# Patient Record
Sex: Female | Born: 1990 | Race: White | Hispanic: No | Marital: Single | Smoking: Former smoker
Health system: Southern US, Community
[De-identification: ages and names within clinical notes are randomized; demographics above are authoritative.]

## PROBLEM LIST (undated history)

## (undated) ENCOUNTER — Ambulatory Visit

## (undated) DIAGNOSIS — F909 Attention-deficit hyperactivity disorder, unspecified type: Secondary | ICD-10-CM

## (undated) DIAGNOSIS — G43909 Migraine, unspecified, not intractable, without status migrainosus: Secondary | ICD-10-CM

## (undated) DIAGNOSIS — I10 Essential (primary) hypertension: Secondary | ICD-10-CM

## (undated) DIAGNOSIS — M419 Scoliosis, unspecified: Secondary | ICD-10-CM

## (undated) HISTORY — DX: Essential (primary) hypertension: I10

## (undated) HISTORY — DX: Migraine, unspecified, not intractable, without status migrainosus: G43.909

## (undated) HISTORY — PX: NO PAST SURGERIES: SHX2092

---

## 2017-06-24 ENCOUNTER — Encounter (HOSPITAL_COMMUNITY): Payer: Self-pay | Admitting: Emergency Medicine

## 2017-06-24 ENCOUNTER — Ambulatory Visit (HOSPITAL_COMMUNITY)
Admission: EM | Admit: 2017-06-24 | Discharge: 2017-06-24 | Disposition: A | Discharge status: Home / Self Care | Payer: Self-pay | Attending: Physician Assistant | Admitting: Physician Assistant

## 2017-06-24 ENCOUNTER — Other Ambulatory Visit: Payer: Self-pay

## 2017-06-24 DIAGNOSIS — J039 Acute tonsillitis, unspecified: Secondary | ICD-10-CM

## 2017-06-24 DIAGNOSIS — J029 Acute pharyngitis, unspecified: Secondary | ICD-10-CM | POA: Insufficient documentation

## 2017-06-24 LAB — POCT RAPID STREP A: STREPTOCOCCUS, GROUP A SCREEN (DIRECT): NEGATIVE

## 2017-06-24 MED ORDER — LIDOCAINE VISCOUS 2 % MT SOLN: 5.0000 mL | OROMUCOSAL | 0 refills | Status: DC | PRN

## 2017-06-24 MED ORDER — AMOXICILLIN 500 MG PO CAPS: 1000.0000 mg | ORAL_CAPSULE | Freq: Two times a day (BID) | ORAL | 0 refills | Status: DC

## 2017-06-24 MED ORDER — IBUPROFEN 800 MG PO TABS: 800.0000 mg | ORAL_TABLET | Freq: Three times a day (TID) | ORAL | 0 refills | Status: DC

## 2017-06-24 NOTE — ED Provider Notes (Signed)
06/24/2017 7:35 PM   DOB: [birth year] / MRN: [id number]  SUBJECTIVE:  Joan Dawson is a 27 y.o. female presenting for right-sided sore throat times 4 days.  She denies fever.  She denies cough or nasal congestion.  She says the pain is getting worse.  She has never had strep throat in the past.  She has never had these symptoms in the past either.  She associates right-sided ear pain.  She denies a change in hearing.  Her menstrual cycle started today and is on time for her.  She states this is always regular.  She has No Known Allergies.   She  has no past medical history on file.    She  reports that she has been smoking.  She uses smokeless tobacco. She reports that she does not drink alcohol or use drugs. She  has no sexual activity history on file. The patient  has no past surgical history on file.  Her family history includes Diabetes in her mother.  Review of Systems  Constitutional: Negative for chills, diaphoresis and fever.  Respiratory: Negative for cough, hemoptysis, sputum production, shortness of breath and wheezing.   Cardiovascular: Negative for chest pain, orthopnea and leg swelling.  Gastrointestinal: Negative for nausea.  Skin: Negative for rash.  Neurological: Negative for dizziness.    OBJECTIVE:  BP 123/65 (BP Location: Left Arm)   Pulse 81   Temp 98.2 F (36.8 C) (Oral)   Wt 242 lb (109.8 kg)   SpO2 100%   Physical Exam  Constitutional: She is active.  Non-toxic appearance.  HENT:  Mouth/Throat: Oropharyngeal exudate (Right-sided.  Uvula midline.) present.  Cardiovascular: Normal rate and regular rhythm.  Pulmonary/Chest: Effort normal. No tachypnea.  Neurological: She is alert.  Skin: Skin is warm and dry. She is not diaphoretic. No pallor.    No results found for this or any previous visit (from the past 72 hour(s)).  No results found.  ASSESSMENT AND PLAN:  No orders of the defined types were placed in this encounter.    Exudative  tonsillitis: Given the isolated complaint of sore throat and her exam I am going to treat her for strep pharyngitis.  I have given her some pain options as well.  Fortunately the uvula is midline and she is well-appearing.      The patient is advised to call or return to clinic if she does not see an improvement in symptoms, or to seek the care of the closest emergency department if she worsens with the above plan.   Deliah BostonMichael Jaylinn Hellenbrand, MHS, PA-C 06/24/2017 7:35 PM    Ofilia Neaslark, Caisen Mangas L, PA-C 06/24/17 [phone]

## 2017-06-24 NOTE — Discharge Instructions (Signed)
Start all of the medications tonight.  We will inform you of the results of your  culture in a few days if your strep test is negative tonight.

## 2017-06-27 LAB — CULTURE, GROUP A STREP (THRC)

## 2017-07-07 ENCOUNTER — Ambulatory Visit (HOSPITAL_COMMUNITY)
Admission: EM | Admit: 2017-07-07 | Discharge: 2017-07-07 | Disposition: A | Discharge status: Home / Self Care | Payer: Self-pay | Attending: Family Medicine | Admitting: Family Medicine

## 2017-07-07 ENCOUNTER — Other Ambulatory Visit: Payer: Self-pay

## 2017-07-07 ENCOUNTER — Encounter (HOSPITAL_COMMUNITY): Payer: Self-pay | Admitting: Emergency Medicine

## 2017-07-07 DIAGNOSIS — R197 Diarrhea, unspecified: Secondary | ICD-10-CM

## 2017-07-07 DIAGNOSIS — J029 Acute pharyngitis, unspecified: Secondary | ICD-10-CM

## 2017-07-07 HISTORY — DX: Scoliosis, unspecified: M41.9

## 2017-07-07 HISTORY — DX: Attention-deficit hyperactivity disorder, unspecified type: F90.9

## 2017-07-07 MED ORDER — CHLORHEXIDINE GLUCONATE 0.12 % MT SOLN: 15.0000 mL | Freq: Two times a day (BID) | OROMUCOSAL | 0 refills | Status: DC

## 2017-07-07 NOTE — Discharge Instructions (Signed)
The painful white area on the right side of your posterior pharynx is called a "concretion" which is really trapped food that has oxidized white.  Gently tease the white material loose with a Q-tip several times a day.  Imodium AD for the diarrhea.  Avoid milk products and greasy food for 2 days.

## 2017-07-07 NOTE — ED Provider Notes (Signed)
[facility]   [id number] 07/07/17 Arrival Time: 1350   SUBJECTIVE:  Joan Dawson is a 27 y.o. female who presents to the urgent care with complaint of Epigastric pain that started last night., no appetite Patient is nauseated, no vomiting.   Patient has diarrhea that started at 3 this morning  Recently treated with amoxicillin.  Past Medical History:  Diagnosis Date  . ADHD   . Scoliosis    Family History  Problem Relation Age of Onset  . Diabetes Mother   . Crohn's disease Sister    Social History   Socioeconomic History  . Marital status: Single    Spouse name: Not on file  . Number of children: Not on file  . Years of education: Not on file  . Highest education level: Not on file  Social Needs  . Financial resource strain: Not on file  . Food insecurity - worry: Not on file  . Food insecurity - inability: Not on file  . Transportation needs - medical: Not on file  . Transportation needs - non-medical: Not on file  Occupational History  . Not on file  Tobacco Use  . Smoking status: Current Some Day Smoker  . Smokeless tobacco: Current User  Substance and Sexual Activity  . Alcohol use: No    Frequency: Never  . Drug use: No  . Sexual activity: Not on file  Other Topics Concern  . Not on file  Social History Narrative  . Not on file   No outpatient medications have been marked as taking for the 07/07/17 encounter [facility](Hospital Encounter).   No Known Allergies    ROS: As per HPI, remainder of ROS negative.   OBJECTIVE:   Vitals:   07/07/17 1449  BP: 122/71  Pulse: 85  Resp: 18  Temp: 97.9 F (36.6 C)  TempSrc: Oral  SpO2: 100%     General appearance: alert; no distress Eyes: PERRL; EOMI; conjunctiva normal HENT: normocephalic; atraumatic; TMs normal, canal normal, external ears normal without trauma; nasal mucosa normal; oral mucosa: concretion right tonsillar pillar Neck: supple Lungs: clear to auscultation bilaterally Heart:  regular rate and rhythm Abdomen: soft, mildly tender epigastrium with no mass or organomegaly;  No rebound. Back: no CVA tenderness Extremities: no cyanosis or edema; symmetrical with no gross deformities Skin: warm and dry Neurologic: normal gait; grossly normal Psychological: alert and cooperative; normal mood and affect      Labs:  Results for orders placed or performed during the hospital encounter of 06/24/17  Culture, group A strep  Result Value Ref Range   Specimen Description THROAT    Special Requests NONE    Culture      NO GROUP A STREP (S.PYOGENES) ISOLATED Performed at [facility], [address] [place], [place] [address]    Report Status 06/27/2017 FINAL   POCT rapid strep A [facility]  Result Value Ref Range   Streptococcus, Group A Screen (Direct) NEGATIVE NEGATIVE    Labs Reviewed - No data to display  No results found.     ASSESSMENT & PLAN:  1. Diarrhea, unspecified type   2. Acute pharyngitis, unspecified etiology     Meds ordered this encounter  Medications  . chlorhexidine (PERIDEX) 0.12 % solution    Sig: Use as directed 15 mLs in the mouth or throat 2 (two) times daily.    Dispense:  120 mL    Refill:  0    Reviewed expectations re: course of current  medical issues. Questions answered. Outlined signs and symptoms indicating need for more acute intervention. Patient verbalized understanding. After Visit Summary given.    Procedures:      Elvina Sidle, MD 07/07/17 [phone]

## 2017-07-07 NOTE — ED Triage Notes (Signed)
Epigastric pain that started last night., no appetite Patient is nauseated, no vomiting.   Patient has diarrhea that started at 3 this morning

## 2017-08-14 ENCOUNTER — Other Ambulatory Visit: Payer: Self-pay

## 2017-08-14 ENCOUNTER — Encounter (HOSPITAL_COMMUNITY): Payer: Self-pay | Admitting: Emergency Medicine

## 2017-08-14 ENCOUNTER — Emergency Department (HOSPITAL_COMMUNITY)
Admission: EM | Admit: 2017-08-14 | Discharge: 2017-08-14 | Disposition: A | Discharge status: Home / Self Care | Payer: Self-pay | Attending: Emergency Medicine | Admitting: Emergency Medicine

## 2017-08-14 DIAGNOSIS — Z79899 Other long term (current) drug therapy: Secondary | ICD-10-CM | POA: Insufficient documentation

## 2017-08-14 DIAGNOSIS — R109 Unspecified abdominal pain: Secondary | ICD-10-CM | POA: Insufficient documentation

## 2017-08-14 DIAGNOSIS — R3129 Other microscopic hematuria: Secondary | ICD-10-CM | POA: Insufficient documentation

## 2017-08-14 DIAGNOSIS — F17290 Nicotine dependence, other tobacco product, uncomplicated: Secondary | ICD-10-CM | POA: Insufficient documentation

## 2017-08-14 LAB — CBC WITH DIFFERENTIAL/PLATELET
Basophils Absolute: 0 10*3/uL (ref 0.0–0.1)
Basophils Relative: 0 %
Eosinophils Absolute: 0.1 10*3/uL (ref 0.0–0.7)
Eosinophils Relative: 1 %
HEMATOCRIT: 40.9 % (ref 36.0–46.0)
HEMOGLOBIN: 13.7 g/dL (ref 12.0–15.0)
LYMPHS PCT: 32 %
Lymphs Abs: 2.6 10*3/uL (ref 0.7–4.0)
MCH: 29.9 pg (ref 26.0–34.0)
MCHC: 33.5 g/dL (ref 30.0–36.0)
MCV: 89.3 fL (ref 78.0–100.0)
Monocytes Absolute: 0.4 10*3/uL (ref 0.1–1.0)
Monocytes Relative: 5 %
NEUTROS ABS: 5.2 10*3/uL (ref 1.7–7.7)
NEUTROS PCT: 62 %
Platelets: 291 10*3/uL (ref 150–400)
RBC: 4.58 MIL/uL (ref 3.87–5.11)
RDW: 12.4 % (ref 11.5–15.5)
WBC: 8.4 10*3/uL (ref 4.0–10.5)

## 2017-08-14 LAB — COMPREHENSIVE METABOLIC PANEL
ALK PHOS: 61 U/L (ref 38–126)
ALT: 18 U/L (ref 14–54)
AST: 15 U/L (ref 15–41)
Albumin: 4.1 g/dL (ref 3.5–5.0)
Anion gap: 11 (ref 5–15)
BILIRUBIN TOTAL: 0.8 mg/dL (ref 0.3–1.2)
BUN: 8 mg/dL (ref 6–20)
CALCIUM: 9.2 mg/dL (ref 8.9–10.3)
CO2: 24 mmol/L (ref 22–32)
CREATININE: 0.75 mg/dL (ref 0.44–1.00)
Chloride: 104 mmol/L (ref 101–111)
Glucose, Bld: 94 mg/dL (ref 65–99)
Potassium: 3.9 mmol/L (ref 3.5–5.1)
SODIUM: 139 mmol/L (ref 135–145)
Total Protein: 8 g/dL (ref 6.5–8.1)

## 2017-08-14 LAB — URINALYSIS, ROUTINE W REFLEX MICROSCOPIC
Bilirubin Urine: NEGATIVE
Glucose, UA: NEGATIVE mg/dL
Ketones, ur: NEGATIVE mg/dL
Nitrite: NEGATIVE
Protein, ur: NEGATIVE mg/dL
SPECIFIC GRAVITY, URINE: 1.003 — AB (ref 1.005–1.030)
pH: 8 (ref 5.0–8.0)

## 2017-08-14 LAB — PREGNANCY, URINE: PREG TEST UR: NEGATIVE

## 2017-08-14 LAB — LIPASE, BLOOD: Lipase: 28 U/L (ref 11–51)

## 2017-08-14 MED ORDER — CYCLOBENZAPRINE HCL 10 MG PO TABS: 10.0000 mg | ORAL_TABLET | Freq: Two times a day (BID) | ORAL | 0 refills | Status: DC | PRN

## 2017-08-14 NOTE — ED Triage Notes (Signed)
Rt flank pain on and off x 4 days, also reports some on and off nausea.

## 2017-08-14 NOTE — Discharge Instructions (Signed)
You have a small amount of microscopic blood in your urine.  This will need to be rechecked in 3 to 4 weeks.  Otherwise blood work was normal.  Prescription for muscle relaxer.  Can take ibuprofen or Tylenol for pain.

## 2017-08-14 NOTE — ED Provider Notes (Signed)
[facility] EMERGENCY DEPARTMENT Provider Note   CSN: [id number] Arrival date & time: 08/14/17  1125     History   Chief Complaint Chief Complaint  Patient presents with  . Flank Pain    HPI Joan Dawson is a 27 y.o. female.  Right flank pain for 3 to 4 days.  No dysuria, hematuria, radiation to the anterior abdomen.  Last menstrual period 3/30.  No trauma.  Family history positive for renal carcinoma in the mother.  Patient works in a call center and attends school.  No history of gallbladder disease.  Severity of pain is mild.  Palpation makes pain worse.     Past Medical History:  Diagnosis Date  . ADHD   . Scoliosis     There are no active problems to display for this patient.   History reviewed. No pertinent surgical history.   OB History   None      Home Medications    Prior to Admission medications   Medication Sig Start Date End Date Taking? Authorizing Provider  Cranberry 500 MG TABS Take 1 tablet by mouth daily.   Yes [provider]  cyclobenzaprine (FLEXERIL) 10 MG tablet Take 1 tablet (10 mg total) by mouth 2 (two) times daily as needed for muscle spasms. 08/14/17   Donnetta Hutching, MD    Family History Family History  Problem Relation Age of Onset  . Diabetes Mother   . Crohn's disease Sister     Social History Social History   Tobacco Use  . Smoking status: Current Some Day Smoker    Types: Cigars  . Smokeless tobacco: Current User  Substance Use Topics  . Alcohol use: No    Frequency: Never  . Drug use: No     Allergies   Patient has no known allergies.   Review of Systems Review of Systems  All other systems reviewed and are negative.    Physical Exam Updated Vital Signs BP 123/77   Pulse 82   Temp 98.5 F (36.9 C) (Oral)   Resp 18   Ht 5\' 9"  (1.753 m)   Wt 114.3 kg (252 lb)   LMP 07/21/2017 (Exact Date)   SpO2 99%   BMI 37.21 kg/m   Physical Exam  Constitutional: She is oriented to person, place,  and time. She appears well-developed and well-nourished.  HENT:  Head: Normocephalic and atraumatic.  Eyes: Conjunctivae are normal.  Neck: Neck supple.  Cardiovascular: Normal rate and regular rhythm.  Pulmonary/Chest: Effort normal and breath sounds normal.  Abdominal: Soft. Bowel sounds are normal.  Genitourinary:  Genitourinary Comments: Minimal right flank tenderness.  Musculoskeletal: Normal range of motion.  Neurological: She is alert and oriented to person, place, and time.  Skin: Skin is warm and dry.  Psychiatric: She has a normal mood and affect. Her behavior is normal.  Nursing note and vitals reviewed.    ED Treatments / Results  Labs (all labs ordered are listed, but only abnormal results are displayed) Labs Reviewed  URINALYSIS, ROUTINE W REFLEX MICROSCOPIC - Abnormal; Notable for the following components:      Result Value   Color, Urine STRAW (*)    Specific Gravity, Urine 1.003 (*)    Hgb urine dipstick SMALL (*)    Leukocytes, UA TRACE (*)    Bacteria, UA RARE (*)    All other components within normal limits  CBC WITH DIFFERENTIAL/PLATELET  COMPREHENSIVE METABOLIC PANEL  LIPASE, BLOOD  PREGNANCY, URINE    EKG None  Radiology No results found.  Procedures Procedures (including critical care time)  Medications Ordered in ED Medications - No data to display   Initial Impression / Assessment and Plan / ED Course  I have reviewed the triage vital signs and the nursing notes.  Pertinent labs & imaging results that were available during my care of the patient were reviewed by me and considered in my medical decision making (see chart for details).     Patient presents with right flank pain without urinary symptoms.  UTI, kidney stone, gallbladder disease all discussed.  Urinalysis shows small amount of hemoglobin.  Otherwise blood work was normal.  These findings were discussed with patient and her mother.  She will get a repeat urinalysis in 3 to 4  weeks.  If still positive at that time, patient can pursue a more extensive work-up.  Final Clinical Impressions(s) / ED Diagnoses   Final diagnoses:  Right flank pain  Microscopic hematuria    ED Discharge Orders        Ordered    cyclobenzaprine (FLEXERIL) 10 MG tablet  2 times daily PRN     08/14/17 1513       Donnetta Hutchingook, Damyiah Moxley, MD 08/14/17 1538

## 2018-02-15 DIAGNOSIS — Z3490 Encounter for supervision of normal pregnancy, unspecified, unspecified trimester: Secondary | ICD-10-CM | POA: Insufficient documentation

## 2018-02-18 ENCOUNTER — Encounter: Payer: Self-pay | Admitting: Obstetrics and Gynecology

## 2018-02-18 ENCOUNTER — Ambulatory Visit (INDEPENDENT_AMBULATORY_CARE_PROVIDER_SITE_OTHER): Payer: Medicaid Other | Admitting: Obstetrics and Gynecology

## 2018-02-18 ENCOUNTER — Other Ambulatory Visit (HOSPITAL_COMMUNITY)
Admission: RE | Admit: 2018-02-18 | Discharge: 2018-02-18 | Disposition: A | Discharge status: Home / Self Care | Payer: Medicaid Other | Source: Ambulatory Visit | Attending: Obstetrics and Gynecology | Admitting: Obstetrics and Gynecology

## 2018-02-18 DIAGNOSIS — Z3490 Encounter for supervision of normal pregnancy, unspecified, unspecified trimester: Secondary | ICD-10-CM

## 2018-02-18 DIAGNOSIS — Z3481 Encounter for supervision of other normal pregnancy, first trimester: Secondary | ICD-10-CM | POA: Diagnosis not present

## 2018-02-18 DIAGNOSIS — A749 Chlamydial infection, unspecified: Secondary | ICD-10-CM

## 2018-02-18 DIAGNOSIS — Z3491 Encounter for supervision of normal pregnancy, unspecified, first trimester: Secondary | ICD-10-CM | POA: Insufficient documentation

## 2018-02-18 DIAGNOSIS — O98811 Other maternal infectious and parasitic diseases complicating pregnancy, first trimester: Secondary | ICD-10-CM

## 2018-02-18 DIAGNOSIS — Z8669 Personal history of other diseases of the nervous system and sense organs: Secondary | ICD-10-CM

## 2018-02-18 DIAGNOSIS — O98819 Other maternal infectious and parasitic diseases complicating pregnancy, unspecified trimester: Secondary | ICD-10-CM

## 2018-02-18 NOTE — Patient Instructions (Signed)
 First Trimester of Pregnancy The first trimester of pregnancy is from week 1 until the end of week 13 (months 1 through 3). A week after a sperm fertilizes an egg, the egg will implant on the wall of the uterus. This embryo will begin to develop into a baby. Genes from you and your partner will form the baby. The female genes will determine whether the baby will be a boy or a girl. At 6-8 weeks, the eyes and face will be formed, and the heartbeat can be seen on ultrasound. At the end of 12 weeks, all the baby's organs will be formed. Now that you are pregnant, you will want to do everything you can to have a healthy baby. Two of the most important things are to get good prenatal care and to follow your health care provider's instructions. Prenatal care is all the medical care you receive before the baby's birth. This care will help prevent, find, and treat any problems during the pregnancy and childbirth. Body changes during your first trimester Your body goes through many changes during pregnancy. The changes vary from woman to woman.  You may gain or lose a couple of pounds at first.  You may feel sick to your stomach (nauseous) and you may throw up (vomit). If the vomiting is uncontrollable, call your health care provider.  You may tire easily.  You may develop headaches that can be relieved by medicines. All medicines should be approved by your health care provider.  You may urinate more often. Painful urination may mean you have a bladder infection.  You may develop heartburn as a result of your pregnancy.  You may develop constipation because certain hormones are causing the muscles that push stool through your intestines to slow down.  You may develop hemorrhoids or swollen veins (varicose veins).  Your breasts may begin to grow larger and become tender. Your nipples may stick out more, and the tissue that surrounds them (areola) may become darker.  Your gums may bleed and may be  sensitive to brushing and flossing.  Dark spots or blotches (chloasma, mask of pregnancy) may develop on your face. This will likely fade after the baby is born.  Your menstrual periods will stop.  You may have a loss of appetite.  You may develop cravings for certain kinds of food.  You may have changes in your emotions from day to day, such as being excited to be pregnant or being concerned that something may go wrong with the pregnancy and baby.  You may have more vivid and strange dreams.  You may have changes in your hair. These can include thickening of your hair, rapid growth, and changes in texture. Some women also have hair loss during or after pregnancy, or hair that feels dry or thin. Your hair will most likely return to normal after your baby is born.  What to expect at prenatal visits During a routine prenatal visit:  You will be weighed to make sure you and the baby are growing normally.  Your blood pressure will be taken.  Your abdomen will be measured to track your baby's growth.  The fetal heartbeat will be listened to between weeks 10 and 14 of your pregnancy.  Test results from any previous visits will be discussed.  Your health care provider may ask you:  How you are feeling.  If you are feeling the baby move.  If you have had any abnormal symptoms, such as leaking fluid, bleeding, severe   headaches, or abdominal cramping.  If you are using any tobacco products, including cigarettes, chewing tobacco, and electronic cigarettes.  If you have any questions.  Other tests that may be performed during your first trimester include:  Blood tests to find your blood type and to check for the presence of any previous infections. The tests will also be used to check for low iron levels (anemia) and protein on red blood cells (Rh antibodies). Depending on your risk factors, or if you previously had diabetes during pregnancy, you may have tests to check for high blood  sugar that affects pregnant women (gestational diabetes).  Urine tests to check for infections, diabetes, or protein in the urine.  An ultrasound to confirm the proper growth and development of the baby.  Fetal screens for spinal cord problems (spina bifida) and Down syndrome.  HIV (human immunodeficiency virus) testing. Routine prenatal testing includes screening for HIV, unless you choose not to have this test.  You may need other tests to make sure you and the baby are doing well.  Follow these instructions at home: Medicines  Follow your health care provider's instructions regarding medicine use. Specific medicines may be either safe or unsafe to take during pregnancy.  Take a prenatal vitamin that contains at least 600 micrograms (mcg) of folic acid.  If you develop constipation, try taking a stool softener if your health care provider approves. Eating and drinking  Eat a balanced diet that includes fresh fruits and vegetables, whole grains, good sources of protein such as meat, eggs, or tofu, and low-fat dairy. Your health care provider will help you determine the amount of weight gain that is right for you.  Avoid raw meat and uncooked cheese. These carry germs that can cause birth defects in the baby.  Eating four or five small meals rather than three large meals a day may help relieve nausea and vomiting. If you start to feel nauseous, eating a few soda crackers can be helpful. Drinking liquids between meals, instead of during meals, also seems to help ease nausea and vomiting.  Limit foods that are high in fat and processed sugars, such as fried and sweet foods.  To prevent constipation: ? Eat foods that are high in fiber, such as fresh fruits and vegetables, whole grains, and beans. ? Drink enough fluid to keep your urine clear or pale yellow. Activity  Exercise only as directed by your health care provider. Most women can continue their usual exercise routine during  pregnancy. Try to exercise for 30 minutes at least 5 days a week. Exercising will help you: ? Control your weight. ? Stay in shape. ? Be prepared for labor and delivery.  Experiencing pain or cramping in the lower abdomen or lower back is a good sign that you should stop exercising. Check with your health care provider before continuing with normal exercises.  Try to avoid standing for long periods of time. Move your legs often if you must stand in one place for a long time.  Avoid heavy lifting.  Wear low-heeled shoes and practice good posture.  You may continue to have sex unless your health care provider tells you not to. Relieving pain and discomfort  Wear a good support bra to relieve breast tenderness.  Take warm sitz baths to soothe any pain or discomfort caused by hemorrhoids. Use hemorrhoid cream if your health care provider approves.  Rest with your legs elevated if you have leg cramps or low back pain.  If you   develop varicose veins in your legs, wear support hose. Elevate your feet for 15 minutes, 3-4 times a day. Limit salt in your diet. Prenatal care  Schedule your prenatal visits by the twelfth week of pregnancy. They are usually scheduled monthly at first, then more often in the last 2 months before delivery.  Write down your questions. Take them to your prenatal visits.  Keep all your prenatal visits as told by your health care provider. This is important. Safety  Wear your seat belt at all times when driving.  Make a list of emergency phone numbers, including numbers for family, friends, the hospital, and police and fire departments. General instructions  Ask your health care provider for a referral to a local prenatal education class. Begin classes no later than the beginning of month 6 of your pregnancy.  Ask for help if you have counseling or nutritional needs during pregnancy. Your health care provider can offer advice or refer you to specialists for help  with various needs.  Do not use hot tubs, steam rooms, or saunas.  Do not douche or use tampons or scented sanitary pads.  Do not cross your legs for long periods of time.  Avoid cat litter boxes and soil used by cats. These carry germs that can cause birth defects in the baby and possibly loss of the fetus by miscarriage or stillbirth.  Avoid all smoking, herbs, alcohol, and medicines not prescribed by your health care provider. Chemicals in these products affect the formation and growth of the baby.  Do not use any products that contain nicotine or tobacco, such as cigarettes and e-cigarettes. If you need help quitting, ask your health care provider. You may receive counseling support and other resources to help you quit.  Schedule a dentist appointment. At home, brush your teeth with a soft toothbrush and be gentle when you floss. Contact a health care provider if:  You have dizziness.  You have mild pelvic cramps, pelvic pressure, or nagging pain in the abdominal area.  You have persistent nausea, vomiting, or diarrhea.  You have a bad smelling vaginal discharge.  You have pain when you urinate.  You notice increased swelling in your face, hands, legs, or ankles.  You are exposed to fifth disease or chickenpox.  You are exposed to German measles (rubella) and have never had it. Get help right away if:  You have a fever.  You are leaking fluid from your vagina.  You have spotting or bleeding from your vagina.  You have severe abdominal cramping or pain.  You have rapid weight gain or loss.  You vomit blood or material that looks like coffee grounds.  You develop a severe headache.  You have shortness of breath.  You have any kind of trauma, such as from a fall or a car accident. Summary  The first trimester of pregnancy is from week 1 until the end of week 13 (months 1 through 3).  Your body goes through many changes during pregnancy. The changes vary from  woman to woman.  You will have routine prenatal visits. During those visits, your health care provider will examine you, discuss any test results you may have, and talk with you about how you are feeling. This information is not intended to replace advice given to you by your health care provider. Make sure you discuss any questions you have with your health care provider. Document Released: 04/04/2001 Document Revised: 03/22/2016 Document Reviewed: 03/22/2016 [employer] Interactive Patient Education  2018   [employer]   Second Trimester of Pregnancy The second trimester is from week 14 through week 27 (months 4 through 6). The second trimester is often a time when you feel your best. Your body has adjusted to being pregnant, and you begin to feel better physically. Usually, morning sickness has lessened or quit completely, you may have more energy, and you may have an increase in appetite. The second trimester is also a time when the fetus is growing rapidly. At the end of the sixth month, the fetus is about 9 inches long and weighs about 1 pounds. You will likely begin to feel the baby move (quickening) between 16 and 20 weeks of pregnancy. Body changes during your second trimester Your body continues to go through many changes during your second trimester. The changes vary from woman to woman.  Your weight will continue to increase. You will notice your lower abdomen bulging out.  You may begin to get stretch marks on your hips, abdomen, and breasts.  You may develop headaches that can be relieved by medicines. The medicines should be approved by your health care provider.  You may urinate more often because the fetus is pressing on your bladder.  You may develop or continue to have heartburn as a result of your pregnancy.  You may develop constipation because certain hormones are causing the muscles that push waste through your intestines to slow down.  You may develop hemorrhoids or  swollen, bulging veins (varicose veins).  You may have back pain. This is caused by: ? Weight gain. ? Pregnancy hormones that are relaxing the joints in your pelvis. ? A shift in weight and the muscles that support your balance.  Your breasts will continue to grow and they will continue to become tender.  Your gums may bleed and may be sensitive to brushing and flossing.  Dark spots or blotches (chloasma, mask of pregnancy) may develop on your face. This will likely fade after the baby is born.  A dark line from your belly button to the pubic area (linea nigra) may appear. This will likely fade after the baby is born.  You may have changes in your hair. These can include thickening of your hair, rapid growth, and changes in texture. Some women also have hair loss during or after pregnancy, or hair that feels dry or thin. Your hair will most likely return to normal after your baby is born.  What to expect at prenatal visits During a routine prenatal visit:  You will be weighed to make sure you and the fetus are growing normally.  Your blood pressure will be taken.  Your abdomen will be measured to track your baby's growth.  The fetal heartbeat will be listened to.  Any test results from the previous visit will be discussed.  Your health care provider may ask you:  How you are feeling.  If you are feeling the baby move.  If you have had any abnormal symptoms, such as leaking fluid, bleeding, severe headaches, or abdominal cramping.  If you are using any tobacco products, including cigarettes, chewing tobacco, and electronic cigarettes.  If you have any questions.  Other tests that may be performed during your second trimester include:  Blood tests that check for: ? Low iron levels (anemia). ? High blood sugar that affects pregnant women (gestational diabetes) between 24 and 28 weeks. ? Rh antibodies. This is to check for a protein on red blood cells (Rh factor).  Urine  tests to   check for infections, diabetes, or protein in the urine.  An ultrasound to confirm the proper growth and development of the baby.  An amniocentesis to check for possible genetic problems.  Fetal screens for spina bifida and Down syndrome.  HIV (human immunodeficiency virus) testing. Routine prenatal testing includes screening for HIV, unless you choose not to have this test.  Follow these instructions at home: Medicines  Follow your health care provider's instructions regarding medicine use. Specific medicines may be either safe or unsafe to take during pregnancy.  Take a prenatal vitamin that contains at least 600 micrograms (mcg) of folic acid.  If you develop constipation, try taking a stool softener if your health care provider approves. Eating and drinking  Eat a balanced diet that includes fresh fruits and vegetables, whole grains, good sources of protein such as meat, eggs, or tofu, and low-fat dairy. Your health care provider will help you determine the amount of weight gain that is right for you.  Avoid raw meat and uncooked cheese. These carry germs that can cause birth defects in the baby.  If you have low calcium intake from food, talk to your health care provider about whether you should take a daily calcium supplement.  Limit foods that are high in fat and processed sugars, such as fried and sweet foods.  To prevent constipation: ? Drink enough fluid to keep your urine clear or pale yellow. ? Eat foods that are high in fiber, such as fresh fruits and vegetables, whole grains, and beans. Activity  Exercise only as directed by your health care provider. Most women can continue their usual exercise routine during pregnancy. Try to exercise for 30 minutes at least 5 days a week. Stop exercising if you experience uterine contractions.  Avoid heavy lifting, wear low heel shoes, and practice good posture.  A sexual relationship may be continued unless your health  care provider directs you otherwise. Relieving pain and discomfort  Wear a good support bra to prevent discomfort from breast tenderness.  Take warm sitz baths to soothe any pain or discomfort caused by hemorrhoids. Use hemorrhoid cream if your health care provider approves.  Rest with your legs elevated if you have leg cramps or low back pain.  If you develop varicose veins, wear support hose. Elevate your feet for 15 minutes, 3-4 times a day. Limit salt in your diet. Prenatal Care  Write down your questions. Take them to your prenatal visits.  Keep all your prenatal visits as told by your health care provider. This is important. Safety  Wear your seat belt at all times when driving.  Make a list of emergency phone numbers, including numbers for family, friends, the hospital, and police and fire departments. General instructions  Ask your health care provider for a referral to a local prenatal education class. Begin classes no later than the beginning of month 6 of your pregnancy.  Ask for help if you have counseling or nutritional needs during pregnancy. Your health care provider can offer advice or refer you to specialists for help with various needs.  Do not use hot tubs, steam rooms, or saunas.  Do not douche or use tampons or scented sanitary pads.  Do not cross your legs for long periods of time.  Avoid cat litter boxes and soil used by cats. These carry germs that can cause birth defects in the baby and possibly loss of the fetus by miscarriage or stillbirth.  Avoid all smoking, herbs, alcohol, and unprescribed drugs. Chemicals   in these products can affect the formation and growth of the baby.  Do not use any products that contain nicotine or tobacco, such as cigarettes and e-cigarettes. If you need help quitting, ask your health care provider.  Visit your dentist if you have not gone yet during your pregnancy. Use a soft toothbrush to brush your teeth and be gentle when  you floss. Contact a health care provider if:  You have dizziness.  You have mild pelvic cramps, pelvic pressure, or nagging pain in the abdominal area.  You have persistent nausea, vomiting, or diarrhea.  You have a bad smelling vaginal discharge.  You have pain when you urinate. Get help right away if:  You have a fever.  You are leaking fluid from your vagina.  You have spotting or bleeding from your vagina.  You have severe abdominal cramping or pain.  You have rapid weight gain or weight loss.  You have shortness of breath with chest pain.  You notice sudden or extreme swelling of your face, hands, ankles, feet, or legs.  You have not felt your baby move in over an hour.  You have severe headaches that do not go away when you take medicine.  You have vision changes. Summary  The second trimester is from week 14 through week 27 (months 4 through 6). It is also a time when the fetus is growing rapidly.  Your body goes through many changes during pregnancy. The changes vary from woman to woman.  Avoid all smoking, herbs, alcohol, and unprescribed drugs. These chemicals affect the formation and growth your baby.  Do not use any tobacco products, such as cigarettes, chewing tobacco, and e-cigarettes. If you need help quitting, ask your health care provider.  Contact your health care provider if you have any questions. Keep all prenatal visits as told by your health care provider. This is important. This information is not intended to replace advice given to you by your health care provider. Make sure you discuss any questions you have with your health care provider. Document Released: 04/04/2001 Document Revised: 05/16/2016 Document Reviewed: 05/16/2016 [employer] Interactive Patient Education  2018 [employer]   Contraception Choices Contraception, also called birth control, refers to methods or devices that prevent pregnancy. Hormonal methods Contraceptive  implant A contraceptive implant is a thin, plastic tube that contains a hormone. It is inserted into the upper part of the arm. It can remain in place for up to 3 years. Progestin-only injections Progestin-only injections are injections of progestin, a synthetic form of the hormone progesterone. They are given every 3 months by a health care provider. Birth control pills Birth control pills are pills that contain hormones that prevent pregnancy. They must be taken once a day, preferably at the same time each day. Birth control patch The birth control patch contains hormones that prevent pregnancy. It is placed on the skin and must be changed once a week for three weeks and removed on the fourth week. A prescription is needed to use this method of contraception. Vaginal ring A vaginal ring contains hormones that prevent pregnancy. It is placed in the vagina for three weeks and removed on the fourth week. After that, the process is repeated with a new ring. A prescription is needed to use this method of contraception. Emergency contraceptive Emergency contraceptives prevent pregnancy after unprotected sex. They come in pill form and can be taken up to 5 days after sex. They work best the sooner they are taken after having   sex. Most emergency contraceptives are available without a prescription. This method should not be used as your only form of birth control. Barrier methods Female condom A female condom is a thin sheath that is worn over the penis during sex. Condoms keep sperm from going inside a woman's body. They can be used with a spermicide to increase their effectiveness. They should be disposed after a single use. Female condom A female condom is a soft, loose-fitting sheath that is put into the vagina before sex. The condom keeps sperm from going inside a woman's body. They should be disposed after a single use. Diaphragm A diaphragm is a soft, dome-shaped barrier. It is inserted into the vagina  before sex, along with a spermicide. The diaphragm blocks sperm from entering the uterus, and the spermicide kills sperm. A diaphragm should be left in the vagina for 6-8 hours after sex and removed within 24 hours. A diaphragm is prescribed and fitted by a health care provider. A diaphragm should be replaced every 1-2 years, after giving birth, after gaining more than 15 lb (6.8 kg), and after pelvic surgery. Cervical cap A cervical cap is a round, soft latex or plastic cup that fits over the cervix. It is inserted into the vagina before sex, along with spermicide. It blocks sperm from entering the uterus. The cap should be left in place for 6-8 hours after sex and removed within 48 hours. A cervical cap must be prescribed and fitted by a health care provider. It should be replaced every 2 years. Sponge A sponge is a soft, circular piece of polyurethane foam with spermicide on it. The sponge helps block sperm from entering the uterus, and the spermicide kills sperm. To use it, you make it wet and then insert it into the vagina. It should be inserted before sex, left in for at least 6 hours after sex, and removed and thrown away within 30 hours. Spermicides Spermicides are chemicals that kill or block sperm from entering the cervix and uterus. They can come as a cream, jelly, suppository, foam, or tablet. A spermicide should be inserted into the vagina with an applicator at least 10-15 minutes before sex to allow time for it to work. The process must be repeated every time you have sex. Spermicides do not require a prescription. Intrauterine contraception Intrauterine device (IUD) An IUD is a T-shaped device that is put in a woman's uterus. There are two types:  Hormone IUD.This type contains progestin, a synthetic form of the hormone progesterone. This type can stay in place for 3-5 years.  Copper IUD.This type is wrapped in copper wire. It can stay in place for 10 years.  Permanent methods of  contraception Female tubal ligation In this method, a woman's fallopian tubes are sealed, tied, or blocked during surgery to prevent eggs from traveling to the uterus. Hysteroscopic sterilization In this method, a small, flexible insert is placed into each fallopian tube. The inserts cause scar tissue to form in the fallopian tubes and block them, so sperm cannot reach an egg. The procedure takes about 3 months to be effective. Another form of birth control must be used during those 3 months. Female sterilization This is a procedure to tie off the tubes that carry sperm (vasectomy). After the procedure, the man can still ejaculate fluid (semen). Natural planning methods Natural family planning In this method, a couple does not have sex on days when the woman could become pregnant. Calendar method This means keeping track of the   length of each menstrual cycle, identifying the days when pregnancy can happen, and not having sex on those days. Ovulation method In this method, a couple avoids sex during ovulation. Symptothermal method This method involves not having sex during ovulation. The woman typically checks for ovulation by watching changes in her temperature and in the consistency of cervical mucus. Post-ovulation method In this method, a couple waits to have sex until after ovulation. Summary  Contraception, also called birth control, means methods or devices that prevent pregnancy.  Hormonal methods of contraception include implants, injections, pills, patches, vaginal rings, and emergency contraceptives.  Barrier methods of contraception can include female condoms, female condoms, diaphragms, cervical caps, sponges, and spermicides.  There are two types of IUDs (intrauterine devices). An IUD can be put in a woman's uterus to prevent pregnancy for 3-5 years.  Permanent sterilization can be done through a procedure for males, females, or both.  Natural family planning methods involve  not having sex on days when the woman could become pregnant. This information is not intended to replace advice given to you by your health care provider. Make sure you discuss any questions you have with your health care provider. Document Released: 04/10/2005 Document Revised: 05/13/2016 Document Reviewed: 05/13/2016 Elsevier Interactive Patient Education  2018 [employer] Inc.   Breastfeeding Choosing to breastfeed is one of the best decisions you can make for yourself and your baby. A change in hormones during pregnancy causes your breasts to make breast milk in your milk-producing glands. Hormones prevent breast milk from being released before your baby is born. They also prompt milk flow after birth. Once breastfeeding has begun, thoughts of your baby, as well as his or her sucking or crying, can stimulate the release of milk from your milk-producing glands. Benefits of breastfeeding Research shows that breastfeeding offers many health benefits for infants and mothers. It also offers a cost-free and convenient way to feed your baby. For your baby  Your first milk (colostrum) helps your baby's digestive system to function better.  Special cells in your milk (antibodies) help your baby to fight off infections.  Breastfed babies are less likely to develop asthma, allergies, obesity, or type 2 diabetes. They are also at lower risk for sudden infant death syndrome (SIDS).  Nutrients in breast milk are better able to meet your baby's needs compared to infant formula.  Breast milk improves your baby's brain development. For you  Breastfeeding helps to create a very special bond between you and your baby.  Breastfeeding is convenient. Breast milk costs nothing and is always available at the correct temperature.  Breastfeeding helps to burn calories. It helps you to lose the weight that you gained during pregnancy.  Breastfeeding makes your uterus return faster to its size before pregnancy.  It also slows bleeding (lochia) after you give birth.  Breastfeeding helps to lower your risk of developing type 2 diabetes, osteoporosis, rheumatoid arthritis, cardiovascular disease, and breast, ovarian, uterine, and endometrial cancer later in life. Breastfeeding basics Starting breastfeeding  Find a comfortable place to sit or lie down, with your neck and back well-supported.  Place a pillow or a rolled-up blanket under your baby to bring him or her to the level of your breast (if you are seated). Nursing pillows are specially designed to help support your arms and your baby while you breastfeed.  Make sure that your baby's tummy (abdomen) is facing your abdomen.  Gently massage your breast. With your fingertips, massage from the outer edges of   your breast inward toward the nipple. This encourages milk flow. If your milk flows slowly, you may need to continue this action during the feeding.  Support your breast with 4 fingers underneath and your thumb above your nipple (make the letter "C" with your hand). Make sure your fingers are well away from your nipple and your baby's mouth.  Stroke your baby's lips gently with your finger or nipple.  When your baby's mouth is open wide enough, quickly bring your baby to your breast, placing your entire nipple and as much of the areola as possible into your baby's mouth. The areola is the colored area around your nipple. ? More areola should be visible above your baby's upper lip than below the lower lip. ? Your baby's lips should be opened and extended outward (flanged) to ensure an adequate, comfortable latch. ? Your baby's tongue should be between his or her lower gum and your breast.  Make sure that your baby's mouth is correctly positioned around your nipple (latched). Your baby's lips should create a seal on your breast and be turned out (everted).  It is common for your baby to suck about 2-3 minutes in order to start the flow of breast  milk. Latching Teaching your baby how to latch onto your breast properly is very important. An improper latch can cause nipple pain, decreased milk supply, and poor weight gain in your baby. Also, if your baby is not latched onto your nipple properly, he or she may swallow some air during feeding. This can make your baby fussy. Burping your baby when you switch breasts during the feeding can help to get rid of the air. However, teaching your baby to latch on properly is still the best way to prevent fussiness from swallowing air while breastfeeding. Signs that your baby has successfully latched onto your nipple  Silent tugging or silent sucking, without causing you pain. Infant's lips should be extended outward (flanged).  Swallowing heard between every 3-4 sucks once your milk has started to flow (after your let-down milk reflex occurs).  Muscle movement above and in front of his or her ears while sucking.  Signs that your baby has not successfully latched onto your nipple  Sucking sounds or smacking sounds from your baby while breastfeeding.  Nipple pain.  If you think your baby has not latched on correctly, slip your finger into the corner of your baby's mouth to break the suction and place it between your baby's gums. Attempt to start breastfeeding again. Signs of successful breastfeeding Signs from your baby  Your baby will gradually decrease the number of sucks or will completely stop sucking.  Your baby will fall asleep.  Your baby's body will relax.  Your baby will retain a small amount of milk in his or her mouth.  Your baby will let go of your breast by himself or herself.  Signs from you  Breasts that have increased in firmness, weight, and size 1-3 hours after feeding.  Breasts that are softer immediately after breastfeeding.  Increased milk volume, as well as a change in milk consistency and color by the fifth day of breastfeeding.  Nipples that are not sore,  cracked, or bleeding.  Signs that your baby is getting enough milk  Wetting at least 1-2 diapers during the first 24 hours after birth.  Wetting at least 5-6 diapers every 24 hours for the first week after birth. The urine should be clear or pale yellow by the age of 5   days.  Wetting 6-8 diapers every 24 hours as your baby continues to grow and develop.  At least 3 stools in a 24-hour period by the age of 5 days. The stool should be soft and yellow.  At least 3 stools in a 24-hour period by the age of 7 days. The stool should be seedy and yellow.  No loss of weight greater than 10% of birth weight during the first 3 days of life.  Average weight gain of 4-7 oz (113-198 g) per week after the age of 4 days.  Consistent daily weight gain by the age of 5 days, without weight loss after the age of 2 weeks. After a feeding, your baby may spit up a small amount of milk. This is normal. Breastfeeding frequency and duration Frequent feeding will help you make more milk and can prevent sore nipples and extremely full breasts (breast engorgement). Breastfeed when you feel the need to reduce the fullness of your breasts or when your baby shows signs of hunger. This is called "breastfeeding on demand." Signs that your baby is hungry include:  Increased alertness, activity, or restlessness.  Movement of the head from side to side.  Opening of the mouth when the corner of the mouth or cheek is stroked (rooting).  Increased sucking sounds, smacking lips, cooing, sighing, or squeaking.  Hand-to-mouth movements and sucking on fingers or hands.  Fussing or crying.  Avoid introducing a pacifier to your baby in the first 4-6 weeks after your baby is born. After this time, you may choose to use a pacifier. Research has shown that pacifier use during the first year of a baby's life decreases the risk of sudden infant death syndrome (SIDS). Allow your baby to feed on each breast as long as he or she  wants. When your baby unlatches or falls asleep while feeding from the first breast, offer the second breast. Because newborns are often sleepy in the first few weeks of life, you may need to awaken your baby to get him or her to feed. Breastfeeding times will vary from baby to baby. However, the following rules can serve as a guide to help you make sure that your baby is properly fed:  Newborns (babies 4 weeks of age or younger) may breastfeed every 1-3 hours.  Newborns should not go without breastfeeding for longer than 3 hours during the day or 5 hours during the night.  You should breastfeed your baby a minimum of 8 times in a 24-hour period.  Breast milk pumping Pumping and storing breast milk allows you to make sure that your baby is exclusively fed your breast milk, even at times when you are unable to breastfeed. This is especially important if you go back to work while you are still breastfeeding, or if you are not able to be present during feedings. Your lactation consultant can help you find a method of pumping that works best for you and give you guidelines about how long it is safe to store breast milk. Caring for your breasts while you breastfeed Nipples can become dry, cracked, and sore while breastfeeding. The following recommendations can help keep your breasts moisturized and healthy:  Avoid using soap on your nipples.  Wear a supportive bra designed especially for nursing. Avoid wearing underwire-style bras or extremely tight bras (sports bras).  Air-dry your nipples for 3-4 minutes after each feeding.  Use only cotton bra pads to absorb leaked breast milk. Leaking of breast milk between feedings is normal.    Use lanolin on your nipples after breastfeeding. Lanolin helps to maintain your skin's normal moisture barrier. Pure lanolin is not harmful (not toxic) to your baby. You may also hand express a few drops of breast milk and gently massage that milk into your nipples and  allow the milk to air-dry.  In the first few weeks after giving birth, some women experience breast engorgement. Engorgement can make your breasts feel heavy, warm, and tender to the touch. Engorgement peaks within 3-5 days after you give birth. The following recommendations can help to ease engorgement:  Completely empty your breasts while breastfeeding or pumping. You may want to start by applying warm, moist heat (in the shower or with warm, water-soaked hand towels) just before feeding or pumping. This increases circulation and helps the milk flow. If your baby does not completely empty your breasts while breastfeeding, pump any extra milk after he or she is finished.  Apply ice packs to your breasts immediately after breastfeeding or pumping, unless this is too uncomfortable for you. To do this: ? Put ice in a plastic bag. ? Place a towel between your skin and the bag. ? Leave the ice on for 20 minutes, 2-3 times a day.  Make sure that your baby is latched on and positioned properly while breastfeeding.  If engorgement persists after 48 hours of following these recommendations, contact your health care provider or a lactation consultant. Overall health care recommendations while breastfeeding  Eat 3 healthy meals and 3 snacks every day. Well-nourished mothers who are breastfeeding need an additional 450-500 calories a day. You can meet this requirement by increasing the amount of a balanced diet that you eat.  Drink enough water to keep your urine pale yellow or clear.  Rest often, relax, and continue to take your prenatal vitamins to prevent fatigue, stress, and low vitamin and mineral levels in your body (nutrient deficiencies).  Do not use any products that contain nicotine or tobacco, such as cigarettes and e-cigarettes. Your baby may be harmed by chemicals from cigarettes that pass into breast milk and exposure to secondhand smoke. If you need help quitting, ask your health care  provider.  Avoid alcohol.  Do not use illegal drugs or marijuana.  Talk with your health care provider before taking any medicines. These include over-the-counter and prescription medicines as well as vitamins and herbal supplements. Some medicines that may be harmful to your baby can pass through breast milk.  It is possible to become pregnant while breastfeeding. If birth control is desired, ask your health care provider about options that will be safe while breastfeeding your baby. Where to find more information: [employer]: www.llli.org Contact a health care provider if:  You feel like you want to stop breastfeeding or have become frustrated with breastfeeding.  Your nipples are cracked or bleeding.  Your breasts are red, tender, or warm.  You have: ? Painful breasts or nipples. ? A swollen area on either breast. ? A fever or chills. ? Nausea or vomiting. ? Drainage other than breast milk from your nipples.  Your breasts do not become full before feedings by the fifth day after you give birth.  You feel sad and depressed.  Your baby is: ? Too sleepy to eat well. ? Having trouble sleeping. ? More than 1 week old and wetting fewer than 6 diapers in a 24-hour period. ? Not gaining weight by 5 days of age.  Your baby has fewer than 3 stools in a   24-hour period.  Your baby's skin or the white parts of his or her eyes become yellow. Get help right away if:  Your baby is overly tired (lethargic) and does not want to wake up and feed.  Your baby develops an unexplained fever. Summary  Breastfeeding offers many health benefits for infant and mothers.  Try to breastfeed your infant when he or she shows early signs of hunger.  Gently tickle or stroke your baby's lips with your finger or nipple to allow the baby to open his or her mouth. Bring the baby to your breast. Make sure that much of the areola is in your baby's mouth. Offer one side and burp the  baby before you offer the other side.  Talk with your health care provider or lactation consultant if you have questions or you face problems as you breastfeed. This information is not intended to replace advice given to you by your health care provider. Make sure you discuss any questions you have with your health care provider. Document Released: 04/10/2005 Document Revised: 05/12/2016 Document Reviewed: 05/12/2016 Elsevier Interactive Patient Education  2018 Elsevier Inc.  

## 2018-02-18 NOTE — Progress Notes (Signed)
Pt presents for initial NOB visit today. This is not a planned pregnancy and FOB is not in the picture.  Pt c/o nausea all day every day.  She voices concerns about working around chemicals during pregnancy.  Flu vaccine offered; pt declined.  Babyscripts optimized schedule offered; pt declined.

## 2018-02-18 NOTE — Progress Notes (Signed)
  Subjective:    Joan Dawson is a G1P0 [redacted]w[redacted]d being seen today for her first obstetrical visit.  Her obstetrical history is significant for first pregnancy. Patient does intend to breast feed. Pregnancy history fully reviewed.  Patient reports nausea and declines medical intervention at this time. 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 ADHD   . Migraines   . Scoliosis    History reviewed. No pertinent surgical history. Family History  Problem Relation Age of Onset  . Diabetes Mother   . Uterine cancer Mother   . Hypertension Mother   . Crohn's disease Sister   . Hypertension Sister   . Hypertension Brother      Exam    Uterus:   12-week size  Pelvic Exam:    Perineum: No Hemorrhoids, Normal Perineum   Vulva: normal   Vagina:  normal mucosa, normal discharge   pH:    Cervix: nulliparous appearance and cervix is closed and long   Adnexa: normal adnexa and no mass, fullness, tenderness   Bony Pelvis: gynecoid  System: Breast:  normal appearance, no masses or tenderness   Skin: normal coloration and turgor, no rashes    Neurologic: oriented, no focal deficits   Extremities: normal strength, tone, and muscle mass   HEENT extra ocular movement intact   Mouth/Teeth mucous membranes moist, pharynx normal without lesions and dental hygiene good   Neck supple and no masses   Cardiovascular: regular rate and rhythm   Respiratory:  appears well, vitals normal, no respiratory distress, acyanotic, normal RR, chest clear, no wheezing, crepitations,  rhonchi, normal symmetric air entry   Abdomen: soft, non-tender; bowel sounds normal; no masses,  no organomegaly   Urinary:       Assessment:    Pregnancy: G1P0 Patient Active Problem List   Diagnosis Date Noted  . History of migraine 02/18/2018  . Supervision of normal pregnancy, antepartum 02/15/2018        Plan:     Initial labs drawn. Prenatal vitamins. Problem list reviewed and updated. Genetic Screening discussed: panorama ordered.  Ultrasound discussed; fetal survey: ordered.  Follow up in 4 weeks. 50% of 30 min visit spent on counseling and coordination of care.     Kaylyne Axton 02/18/2018

## 2018-02-19 LAB — CYTOLOGY - PAP
ADEQUACY: ABSENT
Diagnosis: NEGATIVE
HPV: NOT DETECTED

## 2018-02-19 LAB — CERVICOVAGINAL ANCILLARY ONLY
Bacterial vaginitis: NEGATIVE
CANDIDA VAGINITIS: POSITIVE — AB
CHLAMYDIA, DNA PROBE: POSITIVE — AB
Neisseria Gonorrhea: NEGATIVE
Trichomonas: POSITIVE — AB

## 2018-02-20 DIAGNOSIS — A749 Chlamydial infection, unspecified: Secondary | ICD-10-CM | POA: Insufficient documentation

## 2018-02-20 DIAGNOSIS — O98819 Other maternal infectious and parasitic diseases complicating pregnancy, unspecified trimester: Secondary | ICD-10-CM

## 2018-02-20 MED ORDER — METRONIDAZOLE 500 MG PO TABS: 500.0000 mg | ORAL_TABLET | Freq: Two times a day (BID) | ORAL | 0 refills | Status: DC

## 2018-02-20 MED ORDER — AZITHROMYCIN 500 MG PO TABS: 1000.0000 mg | ORAL_TABLET | Freq: Once | ORAL | 1 refills | Status: DC

## 2018-02-20 MED ORDER — FLUCONAZOLE 150 MG PO TABS: 150.0000 mg | ORAL_TABLET | Freq: Once | ORAL | 0 refills | Status: AC

## 2018-02-20 NOTE — Addendum Note (Signed)
Addended by: Catalina Antigua on: 02/20/2018 01:36 PM   Modules accepted: Orders

## 2018-02-21 ENCOUNTER — Telehealth: Payer: Self-pay

## 2018-02-21 DIAGNOSIS — A749 Chlamydial infection, unspecified: Secondary | ICD-10-CM

## 2018-02-21 DIAGNOSIS — O98819 Other maternal infectious and parasitic diseases complicating pregnancy, unspecified trimester: Principal | ICD-10-CM

## 2018-02-21 LAB — OBSTETRIC PANEL, INCLUDING HIV
ANTIBODY SCREEN: NEGATIVE
Basophils Absolute: 0 10*3/uL (ref 0.0–0.2)
Basos: 1 %
EOS (ABSOLUTE): 0.1 10*3/uL (ref 0.0–0.4)
Eos: 1 %
HIV SCREEN 4TH GENERATION: NONREACTIVE
Hematocrit: 41.2 % (ref 34.0–46.6)
Hemoglobin: 13.6 g/dL (ref 11.1–15.9)
Hepatitis B Surface Ag: NEGATIVE
IMMATURE GRANS (ABS): 0 10*3/uL (ref 0.0–0.1)
Immature Granulocytes: 0 %
Lymphocytes Absolute: 2.8 10*3/uL (ref 0.7–3.1)
Lymphs: 32 %
MCH: 31.6 pg (ref 26.6–33.0)
MCHC: 33 g/dL (ref 31.5–35.7)
MCV: 96 fL (ref 79–97)
MONOCYTES: 6 %
Monocytes Absolute: 0.5 10*3/uL (ref 0.1–0.9)
NEUTROS ABS: 5.4 10*3/uL (ref 1.4–7.0)
Neutrophils: 60 %
Platelets: 270 10*3/uL (ref 150–450)
RBC: 4.31 x10E6/uL (ref 3.77–5.28)
RDW: 12.7 % (ref 12.3–15.4)
RPR: NONREACTIVE
RUBELLA: 3.79 {index} (ref >0.99)
Rh Factor: POSITIVE
WBC: 8.8 10*3/uL (ref 3.4–10.8)

## 2018-02-21 LAB — HEMOGLOBINOPATHY EVALUATION
HEMOGLOBIN A2 QUANTITATION: 2.1 % (ref 1.8–3.2)
HGB A: 97.9 % (ref 96.4–98.8)
HGB C: 0 %
HGB S: 0 %
HGB VARIANT: 0 %
Hemoglobin F Quantitation: 0 % (ref 0.0–2.0)

## 2018-02-21 LAB — URINE CULTURE, OB REFLEX

## 2018-02-21 LAB — CULTURE, OB URINE

## 2018-02-21 MED ORDER — AZITHROMYCIN 500 MG PO TABS: 1000.0000 mg | ORAL_TABLET | Freq: Once | ORAL | 1 refills | Status: AC

## 2018-02-21 NOTE — Telephone Encounter (Signed)
Pt states that she was not able to keep the antibiotics down last night. She states that she vomited within an hour of taking them. Per Dr. Clearance Coots retreat for the chlamydia. Advised patient not to take the azithromycin with the flagyl, and reminded her to not to take on an empty stomach.

## 2018-02-25 LAB — CYSTIC FIBROSIS MUTATION 97: GENE DIS ANAL CARRIER INTERP BLD/T-IMP: NOT DETECTED

## 2018-02-28 LAB — SMN1 COPY NUMBER ANALYSIS (SMA CARRIER SCREENING)

## 2018-03-18 ENCOUNTER — Encounter: Payer: Self-pay | Admitting: Obstetrics and Gynecology

## 2018-03-18 ENCOUNTER — Ambulatory Visit (INDEPENDENT_AMBULATORY_CARE_PROVIDER_SITE_OTHER): Payer: Medicaid Other | Admitting: Obstetrics and Gynecology

## 2018-03-18 DIAGNOSIS — Z3490 Encounter for supervision of normal pregnancy, unspecified, unspecified trimester: Secondary | ICD-10-CM

## 2018-03-18 DIAGNOSIS — Z3492 Encounter for supervision of normal pregnancy, unspecified, second trimester: Secondary | ICD-10-CM

## 2018-03-18 DIAGNOSIS — Z3A15 15 weeks gestation of pregnancy: Secondary | ICD-10-CM

## 2018-03-18 MED ORDER — METRONIDAZOLE 0.75 % VA GEL: 1.0000 | Freq: Every day | VAGINAL | 1 refills | Status: DC

## 2018-03-18 MED ORDER — PROMETHAZINE HCL 25 MG PO TABS: 25.0000 mg | ORAL_TABLET | Freq: Four times a day (QID) | ORAL | 1 refills | Status: DC | PRN

## 2018-03-18 MED ORDER — FLUCONAZOLE 150 MG PO TABS: 150.0000 mg | ORAL_TABLET | Freq: Once | ORAL | 0 refills | Status: AC

## 2018-03-18 MED ORDER — AZITHROMYCIN 500 MG PO TABS: 1000.0000 mg | ORAL_TABLET | Freq: Once | ORAL | 1 refills | Status: AC

## 2018-03-18 NOTE — Progress Notes (Signed)
   PRENATAL VISIT NOTE  Subjective:  Joan Dawson is a 27 y.o. G1P0 at 840w0d being seen today for ongoing prenatal care.  She is currently monitored for the following issues for this low-risk pregnancy and has Supervision of normal pregnancy, antepartum; History of migraine; and Chlamydia infection affecting pregnancy, antepartum on their problem list.  Patient reports no complaints.  Contractions: Not present. Vag. Bleeding: None.  Movement: Absent. Denies leaking of fluid.   The following portions of the patient's history were reviewed and updated as appropriate: allergies, current medications, past family history, past medical history, past social history, past surgical history and problem list. Problem list updated.  Objective:   Vitals:   03/18/18 0814  BP: 120/76  Pulse: 80  Weight: 248 lb (112.5 kg)    Fetal Status: Fetal Heart Rate (bpm): 142   Movement: Absent     General:  Alert, oriented and cooperative. Patient is in no acute distress.  Skin: Skin is warm and dry. No rash noted.   Cardiovascular: Normal heart rate noted  Respiratory: Normal respiratory effort, no problems with respiration noted  Abdomen: Soft, gravid, appropriate for gestational age.  Pain/Pressure: Absent     Pelvic: Cervical exam deferred        Extremities: Normal range of motion.  Edema: Trace  Mental Status: Normal mood and affect. Normal behavior. Normal judgment and thought content.   Assessment and Plan:  Pregnancy: G1P0 at 7340w0d  1. Encounter for supervision of normal pregnancy, antepartum, unspecified gravidity Patient is doing well without complaints AFP today Rx metrogel provided for treatment of trich Anatomy ultrasound scheduled 12/23 - AFP, Serum, Open Spina Bifida  Preterm labor symptoms and general obstetric precautions including but not limited to vaginal bleeding, contractions, leaking of fluid and fetal movement were reviewed in detail with the patient. Please refer to After  Visit Summary for other counseling recommendations.  Return in about 4 weeks (around 04/15/2018) for ROB.  Future Appointments  Date Time Provider Department Center  03/18/2018  8:30 AM Pacey Altizer, Gigi GinPeggy, MD [facility] None  04/15/2018 10:30 AM [facility] US 1 [facility]    Catalina AntiguaPeggy Mykala Mccready, MD

## 2018-03-18 NOTE — Progress Notes (Signed)
+  TRIC on 02/18/18 Pt states she was not able to tolerate medication x 2. For Trich.  Pt states she has not had any unprotected intercourse.   Pt unable to void at this time for urine sample.

## 2018-03-20 LAB — AFP, SERUM, OPEN SPINA BIFIDA
AFP MOM: 1.32
AFP VALUE AFPOSL: 28.4 ng/mL
Gest. Age on Collection Date: 15 weeks
MATERNAL AGE AT EDD: 25.7 a
OSBR Risk 1 IN: 4529
TEST RESULTS AFP: NEGATIVE
Weight: 248 [lb_av]

## 2018-04-08 ENCOUNTER — Encounter (HOSPITAL_COMMUNITY): Payer: Self-pay

## 2018-04-15 ENCOUNTER — Encounter: Payer: Self-pay | Admitting: Obstetrics and Gynecology

## 2018-04-15 ENCOUNTER — Ambulatory Visit (INDEPENDENT_AMBULATORY_CARE_PROVIDER_SITE_OTHER): Payer: Medicaid Other | Admitting: Obstetrics and Gynecology

## 2018-04-15 ENCOUNTER — Other Ambulatory Visit (HOSPITAL_COMMUNITY)
Admission: RE | Admit: 2018-04-15 | Discharge: 2018-04-15 | Disposition: A | Discharge status: Home / Self Care | Payer: Medicaid Other | Source: Ambulatory Visit | Attending: Obstetrics and Gynecology | Admitting: Obstetrics and Gynecology

## 2018-04-15 ENCOUNTER — Ambulatory Visit (HOSPITAL_COMMUNITY)
Admission: RE | Admit: 2018-04-15 | Discharge: 2018-04-15 | Disposition: A | Discharge status: Home / Self Care | Payer: Medicaid Other | Source: Ambulatory Visit | Attending: Obstetrics and Gynecology | Admitting: Obstetrics and Gynecology

## 2018-04-15 VITALS — BP 118/74 | HR 94 | Wt 250.0 lb

## 2018-04-15 DIAGNOSIS — O98812 Other maternal infectious and parasitic diseases complicating pregnancy, second trimester: Secondary | ICD-10-CM

## 2018-04-15 DIAGNOSIS — Z3A19 19 weeks gestation of pregnancy: Secondary | ICD-10-CM | POA: Diagnosis not present

## 2018-04-15 DIAGNOSIS — Z363 Encounter for antenatal screening for malformations: Secondary | ICD-10-CM | POA: Diagnosis not present

## 2018-04-15 DIAGNOSIS — O98819 Other maternal infectious and parasitic diseases complicating pregnancy, unspecified trimester: Secondary | ICD-10-CM

## 2018-04-15 DIAGNOSIS — Z3490 Encounter for supervision of normal pregnancy, unspecified, unspecified trimester: Secondary | ICD-10-CM

## 2018-04-15 DIAGNOSIS — Z3492 Encounter for supervision of normal pregnancy, unspecified, second trimester: Secondary | ICD-10-CM | POA: Insufficient documentation

## 2018-04-15 DIAGNOSIS — A749 Chlamydial infection, unspecified: Secondary | ICD-10-CM | POA: Diagnosis present

## 2018-04-15 NOTE — Patient Instructions (Signed)

## 2018-04-15 NOTE — Progress Notes (Signed)
Subjective:  Joan Dawson is a 27 y.o. G1P0 at 8139w0d being seen today for ongoing prenatal care.  She is currently monitored for the following issues for this low-risk pregnancy and has Supervision of normal pregnancy, antepartum; History of migraine; and Chlamydia infection affecting pregnancy, antepartum on their problem list.  Patient reports no complaints.  Contractions: Not present. Vag. Bleeding: None.  Movement: Present. Denies leaking of fluid.   The following portions of the patient's history were reviewed and updated as appropriate: allergies, current medications, past family history, past medical history, past social history, past surgical history and problem list. Problem list updated.  Objective:   Vitals:   04/15/18 0925  BP: 118/74  Pulse: 94  Weight: 250 lb (113.4 kg)    Fetal Status: Fetal Heart Rate (bpm): 150   Movement: Present     General:  Alert, oriented and cooperative. Patient is in no acute distress.  Skin: Skin is warm and dry. No rash noted.   Cardiovascular: Normal heart rate noted  Respiratory: Normal respiratory effort, no problems with respiration noted  Abdomen: Soft, gravid, appropriate for gestational age. Pain/Pressure: Absent     Pelvic:  Cervical exam deferred        Extremities: Normal range of motion.     Mental Status: Normal mood and affect. Normal behavior. Normal judgment and thought content.   Urinalysis:      Assessment and Plan:  Pregnancy: G1P0 at 1739w0d  1. Encounter for supervision of normal pregnancy, antepartum, unspecified gravidity Stable Anatomy scan today  2. Chlamydia infection affecting pregnancy, antepartum TOC today  Preterm labor symptoms and general obstetric precautions including but not limited to vaginal bleeding, contractions, leaking of fluid and fetal movement were reviewed in detail with the patient. Please refer to After Visit Summary for other counseling recommendations.  Return in about 4 weeks (around  05/13/2018) for OB visit.   Hermina StaggersErvin, Javel Hersh L, MD

## 2018-04-15 NOTE — Addendum Note (Signed)
Addended by: Marya LandryFOSTER, Terez Freimark D on: 04/15/2018 10:24 AM   Modules accepted: Orders

## 2018-04-16 LAB — URINE CYTOLOGY ANCILLARY ONLY
Chlamydia: NEGATIVE
Neisseria Gonorrhea: NEGATIVE
Trichomonas: NEGATIVE

## 2018-04-24 NOTE — L&D Delivery Note (Addendum)
Delivery Note Progressed quickly to complete dilation and AROMed for light MSAF Pushed well for a short time to SVD At 1:05 AM a viable and healthy female was delivered via Vaginal, Spontaneous (Presentation:LOA).  APGAR: 8, 9; weight  .   Placenta status: Spontaneous and grossly intact with what appeared to be a 2-vessel  Cord:  with the following complications: mild shoulder dystocia  After delivery of head, had patient push.  Anterior shoulder did not move. McRoberts maneuver done.  When shoulder still did not move, I called time and asked for more assistance in room.  I was able to grasp axilla of posterior shoulder and partially deliver it.  I then grasped axilla of anterior shoulder and delivered it.  Total time was between 30-45 seconds.  Moderate sized neonatal umbilical hernia noted on baby.   Anesthesia:  Local with repair Episiotomy: None Lacerations: 1st degree;Right Labial;1st degree shallow Perineal, Periclitoral which required repair Suture Repair: 4-0 Monocryl on SH needle Est. Blood Loss (mL): 190  Mom to postpartum.  Baby to Couplet care / Skin to Skin.  Wynelle Bourgeois 09/18/2018, 2:04 AM  [facility] Please schedule this patient for Postpartum visit in: 4 weeks with the following provider: Any provider For C/S patients schedule nurse incision check in weeks 2 weeks: no Low risk pregnancy complicated by: GBS positive, Mild shoulder dystocia Delivery mode:  SVD Anticipated Birth Control:  Depo PP Procedures needed: none  Schedule Integrated BH visit: no

## 2018-05-13 ENCOUNTER — Ambulatory Visit (INDEPENDENT_AMBULATORY_CARE_PROVIDER_SITE_OTHER): Payer: Medicaid Other | Admitting: Advanced Practice Midwife

## 2018-05-13 ENCOUNTER — Encounter: Payer: Self-pay | Admitting: Advanced Practice Midwife

## 2018-05-13 VITALS — BP 146/90 | HR 90 | Wt 249.0 lb

## 2018-05-13 DIAGNOSIS — M419 Scoliosis, unspecified: Secondary | ICD-10-CM

## 2018-05-13 DIAGNOSIS — Z3492 Encounter for supervision of normal pregnancy, unspecified, second trimester: Secondary | ICD-10-CM

## 2018-05-13 DIAGNOSIS — O99891 Other specified diseases and conditions complicating pregnancy: Secondary | ICD-10-CM

## 2018-05-13 DIAGNOSIS — O26892 Other specified pregnancy related conditions, second trimester: Secondary | ICD-10-CM

## 2018-05-13 DIAGNOSIS — O9989 Other specified diseases and conditions complicating pregnancy, childbirth and the puerperium: Secondary | ICD-10-CM

## 2018-05-13 DIAGNOSIS — M549 Dorsalgia, unspecified: Secondary | ICD-10-CM

## 2018-05-13 DIAGNOSIS — O162 Unspecified maternal hypertension, second trimester: Secondary | ICD-10-CM

## 2018-05-13 DIAGNOSIS — R12 Heartburn: Secondary | ICD-10-CM

## 2018-05-13 DIAGNOSIS — Z3A23 23 weeks gestation of pregnancy: Secondary | ICD-10-CM

## 2018-05-13 DIAGNOSIS — Z3490 Encounter for supervision of normal pregnancy, unspecified, unspecified trimester: Secondary | ICD-10-CM

## 2018-05-13 MED ORDER — COMFORT FIT MATERNITY SUPP MED MISC: 1.0000 | Freq: Every day | 0 refills | Status: DC

## 2018-05-13 MED ORDER — CYCLOBENZAPRINE HCL 10 MG PO TABS: 5.0000 mg | ORAL_TABLET | Freq: Three times a day (TID) | ORAL | 0 refills | Status: DC | PRN

## 2018-05-13 MED ORDER — FAMOTIDINE 20 MG PO TABS: 20.0000 mg | ORAL_TABLET | Freq: Two times a day (BID) | ORAL | 5 refills | Status: DC

## 2018-05-13 NOTE — Patient Instructions (Signed)

## 2018-05-13 NOTE — Progress Notes (Addendum)
   PRENATAL VISIT NOTE  Subjective:  Joan Dawson is a 28 y.o. G1P0 at 67w0dbeing seen today for ongoing prenatal care.  She is currently monitored for the following issues for this low-risk pregnancy and has Supervision of normal pregnancy, antepartum; History of migraine; and Chlamydia infection affecting pregnancy, antepartum on their problem list.  Patient reports backache.  Contractions: Not present. Vag. Bleeding: None.  Movement: Present. Denies leaking of fluid.   The following portions of the patient's history were reviewed and updated as appropriate: allergies, current medications, past family history, past medical history, past social history, past surgical history and problem list. Problem list updated.  Objective:   Vitals:   05/13/18 0847  BP: (!) 146/90  Pulse: 90  Weight: 112.9 kg    Fetal Status: Fetal Heart Rate (bpm): 152   Movement: Present     General:  Alert, oriented and cooperative. Patient is in no acute distress.  Skin: Skin is warm and dry. No rash noted.   Cardiovascular: Normal heart rate noted  Respiratory: Normal respiratory effort, no problems with respiration noted  Abdomen: Soft, gravid, appropriate for gestational age.  Pain/Pressure: Present     Pelvic: Cervical exam deferred        Extremities: Normal range of motion.  Edema: Trace  Mental Status: Normal mood and affect. Normal behavior. Normal judgment and thought content.   Assessment and Plan:  Pregnancy: G1P0 at 274w0d 1. Encounter for supervision of normal pregnancy, antepartum, unspecified gravidity --Anticipatory guidance about next visits/weeks of pregnancy given.  2. Hypertension affecting pregnancy in second trimester --First HTN today.  No symptoms.  Reviewed s/sx of preeclampsia/reasons to seek care. - CBC - Comp Met (CMET) - Protein / creatinine ratio, urine  3. Back pain complicating pregnancy in second trimester --Rest/ice/heat/warm bath/Tylenol/Flexeril as  prescribed. --Maternity support belt - Ambulatory referral to Physical Therapy  4. Scoliosis, unspecified scoliosis type, unspecified spinal region --Chronic back pain, worse with physical job. 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  Letter provided for basic pregnancy restrictions plus no lying supine for >10 minutes at a time. - Ambulatory referral to Physical Therapy  5. Heartburn during pregnancy in second trimester --Pepcid 20 mg BID PRN  Preterm labor symptoms and general obstetric precautions including but not limited to vaginal bleeding, contractions, leaking of fluid and fetal movement were reviewed in detail with the patient. Please refer to After Visit Summary for other counseling recommendations.  Return in about 4 weeks (around 06/10/2018).  Future Appointments  Date Time Provider DeElliott2/18/2020  8:15 AM Leftwich-Kirby, LiKathie DikeCNM [facility] None    LiFatima BlankCNM

## 2018-05-13 NOTE — Progress Notes (Signed)
B/P elevated pt denies any visual changes or swelling notes swelling while working 10 hr shift.  FLU : DECLINED

## 2018-05-14 LAB — COMPREHENSIVE METABOLIC PANEL
ALK PHOS: 51 IU/L (ref 39–117)
ALT: 11 IU/L (ref 0–32)
AST: 11 IU/L (ref 0–40)
Albumin/Globulin Ratio: 1.4 (ref 1.2–2.2)
Albumin: 3.9 g/dL (ref 3.9–5.0)
BUN/Creatinine Ratio: 16 (ref 9–23)
BUN: 8 mg/dL (ref 6–20)
Bilirubin Total: 0.3 mg/dL (ref 0.0–1.2)
CALCIUM: 9.4 mg/dL (ref 8.7–10.2)
CO2: 21 mmol/L (ref 20–29)
CREATININE: 0.5 mg/dL — AB (ref 0.57–1.00)
Chloride: 104 mmol/L (ref 96–106)
GFR calc Af Amer: 156 mL/min/{1.73_m2} (ref >59)
GFR calc non Af Amer: 135 mL/min/{1.73_m2} (ref >59)
Globulin, Total: 2.8 g/dL (ref 1.5–4.5)
Glucose: 86 mg/dL (ref 65–99)
Potassium: 4.4 mmol/L (ref 3.5–5.2)
Sodium: 137 mmol/L (ref 134–144)
Total Protein: 6.7 g/dL (ref 6.0–8.5)

## 2018-05-14 LAB — CBC
HEMATOCRIT: 37.6 % (ref 34.0–46.6)
Hemoglobin: 12.4 g/dL (ref 11.1–15.9)
MCH: 31.5 pg (ref 26.6–33.0)
MCHC: 33 g/dL (ref 31.5–35.7)
MCV: 95 fL (ref 79–97)
PLATELETS: 259 10*3/uL (ref 150–450)
RBC: 3.94 x10E6/uL (ref 3.77–5.28)
RDW: 12.2 % (ref 11.7–15.4)
WBC: 10.9 10*3/uL — ABNORMAL HIGH (ref 3.4–10.8)

## 2018-05-14 LAB — PROTEIN / CREATININE RATIO, URINE
CREATININE, UR: 194.4 mg/dL
PROTEIN UR: 19.5 mg/dL
PROTEIN/CREAT RATIO: 100 mg/g{creat} (ref 0–200)

## 2018-05-16 ENCOUNTER — Telehealth: Payer: Self-pay

## 2018-05-16 NOTE — Telephone Encounter (Signed)
Returned call and updated pt's accommadation letter, left at front desk.

## 2018-05-20 ENCOUNTER — Telehealth: Payer: Self-pay

## 2018-05-20 NOTE — Telephone Encounter (Signed)
Returned call, no answer, left vm 

## 2018-05-21 ENCOUNTER — Telehealth: Payer: Self-pay

## 2018-05-21 NOTE — Telephone Encounter (Signed)
Returned call, and pt stated her employer needs letter to be modified, pt stated that she is ok with the requirements and does not want to lose her job. Advised that completed letter would be left up front.

## 2018-05-24 ENCOUNTER — Encounter: Payer: Self-pay | Admitting: Physical Therapy

## 2018-05-24 ENCOUNTER — Ambulatory Visit: Payer: Medicaid Other | Attending: Advanced Practice Midwife | Admitting: Physical Therapy

## 2018-05-24 DIAGNOSIS — R293 Abnormal posture: Secondary | ICD-10-CM | POA: Insufficient documentation

## 2018-05-24 DIAGNOSIS — M545 Low back pain: Secondary | ICD-10-CM | POA: Insufficient documentation

## 2018-05-24 DIAGNOSIS — M62830 Muscle spasm of back: Secondary | ICD-10-CM | POA: Insufficient documentation

## 2018-05-24 DIAGNOSIS — G8929 Other chronic pain: Secondary | ICD-10-CM | POA: Diagnosis present

## 2018-05-26 ENCOUNTER — Encounter: Payer: Self-pay | Admitting: Physical Therapy

## 2018-05-26 NOTE — Therapy (Signed)
[facility] [address] [place], [place], [address] Phone: [phone]   Fax:  [phone]  Physical Therapy Evaluation  Patient Details  Name: Joan Dawson MRN: [id number] Date of Birth: 1990-07-22 Referring Provider (PT): Isaias Sakai    Encounter Date: 05/24/2018  PT End of Session - 05/26/18 1554    Visit Number  1    Number of Visits  4    Date for PT Re-Evaluation  06/23/18    Authorization Type  Mediciad     PT Start Time  1100    PT Stop Time  1150    PT Time Calculation (min)  50 min    Activity Tolerance  Patient tolerated treatment well    Behavior During Therapy  [facility] for tasks assessed/performed       Past Medical History:  Diagnosis Date  . ADHD   . Migraines   . Scoliosis     History reviewed. No pertinent surgical history.  There were no vitals filed for this visit.       [facility] PT Assessment - 05/26/18 0001      Assessment   Medical Diagnosis  Low Back Pain     Referring Provider (PT)  Misty Stanley Leftwitch Craige Cotta     Onset Date/Surgical Date  --   3 months prior   Next MD Visit  06/11/2018    Prior Therapy  none       Precautions   Precautions  None      Restrictions   Weight Bearing Restrictions  No      Balance Screen   Has the patient fallen in the past 6 months  No    Has the patient had a decrease in activity level because of a fear of falling?   No    Is the patient reluctant to leave their home because of a fear of falling?   No      Prior Function   Level of Independence  Independent    Vocation  Full time employment    Leisure  Nothing;       Cognition   Overall Cognitive Status  Within Functional Limits for tasks assessed    Attention  Focused    Focused Attention  Appears intact    Awareness  Appears intact    Problem Solving  Appears intact      Sensation   Additional Comments  Patient reports pain into his left hip at times       Coordination   Gross Motor  Movements are Fluid and Coordinated  Yes    Fine Motor Movements are Fluid and Coordinated  Yes      Posture/Postural Control   Posture Comments  rounded shoulders      ROM / Strength   AROM / PROM / Strength  PROM;AROM;Strength      AROM   AROM Assessment Site  Lumbar    Lumbar Flexion  limited 25%     Lumbar Extension  no limit     Lumbar - Right Side Bend  no limit     Lumbar - Left Side Bend  no limit     Lumbar - Right Rotation  no limit       Strength   Strength Assessment Site  Hip;Knee    Right/Left Hip  Right;Left    Right Hip Flexion  4+/5    Right Hip ABduction  4+/5    Left Hip Flexion  4+/5  Left Hip ABduction  4+/5      Palpation   Palpation comment  spsming of bilateral lumbar paraspinals L > R                 Objective measurements completed on examination: See above findings.      [facility] Adult PT Treatment/Exercise - 05/26/18 0001      Lumbar Exercises: Quadruped   Other Quadruped Lumbar Exercises  qudruped rocking; alt UE/ alt LE    Other Quadruped Lumbar Exercises  seted lateral stretch; seated front bend       Shoulder Exercises: Stretch   Other Shoulder Stretches  patient has some mid thoriac ci pain. Sdhe was given rhomboid stretch; sink stretch and posterior capsuale stretch for home             PT Education - 05/26/18 1544    Education Details  Patient educated on positioning and posture     Person(s) Educated  Patient    Methods  Explanation;Demonstration;Verbal cues;Tactile cues;Handout    Comprehension  Verbalized understanding;Returned demonstration;Verbal cues required       PT Short Term Goals - 05/26/18 1604      PT SHORT TERM GOAL #1   Title  Patient will report decreased tendenress to palpation     Baseline  moderate tendenrness to palpation     Time  3    Period  Weeks    Status  New    Target Date  06/16/18      PT SHORT TERM GOAL #2   Title  Patient will  be indepdnent with basic HEP     Baseline  No  HEP     Time  3    Period  Weeks    Status  New    Target Date  06/16/18      PT SHORT TERM GOAL #3   Title  Patient will dmeonstrate 5/5 gross hip strength     Baseline  4+/5 bilateral hip flexion and abduction     Time  3    Period  Weeks    Status  New    Target Date  06/16/18                Plan - 05/26/18 1555    Clinical Impression Statement  Patient is a 28 year old female with lower back pain that has been increaseing over the past month. She is 20 weeks pregnent. The pain increases when she stands. She has spasming in her lower back L>R. She also has mid toracic pain when she stands for too long. She has a hitory of scoliosis. Her back pain was intermittent until recently. She was given different positioning options to try at home. Therapy alkso talked to her about SI belts and suport belts. She would benefit from further skilled therapy to reduce spasming and improve hip and plevic stability, and to provide strategies for pain relief as her pregnency progresse.     History and Personal Factors relevant to plan of care:  scoliosis, ADHD     Clinical Presentation  Evolving    Clinical Presentation due to:  pain that increases as her pregnency has progressed     Clinical Decision Making  Moderate    Rehab Potential  Good    Clinical Impairments Affecting Rehab Potential  pregnancy, scoliosis     PT Frequency  1x / week    PT Duration  3 weeks    PT Treatment/Interventions  ADLs/Self  Care Home Management;Cryotherapy;Moist Heat;Stair training;Gait training;Therapeutic exercise;Therapeutic activities;Patient/family education;Manual techniques;Passive range of motion;Taping;Neuromuscular re-education    PT Next Visit Plan  soft tissue mobilization to lumbar spine; light pelvic and core stability; review abdominal breathing; march; clamshell. She did nlot exercise before she was pregnent so keep exercises light; Patient also has mid thoriacic pain. Consder postural correction      PT Home Exercise Plan  Patient given pregnency positioning sheets     Recommended Other Services  Patient asked about PT post-partum. She was advised that womens health might be a good option for her     Consulted and Agree with Plan of Care  Patient       Patient will benefit from skilled therapeutic intervention in order to improve the following deficits and impairments:  Abnormal gait, Decreased activity tolerance, Decreased coordination, Decreased knowledge of use of DME, Decreased safety awareness, Pain, Increased fascial restricitons, Decreased range of motion, Increased muscle spasms  Visit Diagnosis: Chronic bilateral low back pain without sciatica  Muscle spasm of back  Abnormal posture     Problem List Patient Active Problem List   Diagnosis Date Noted  . Chlamydia infection affecting pregnancy, antepartum 02/20/2018  . History of migraine 02/18/2018  . Supervision of normal pregnancy, antepartum 02/15/2018    Dessie Comaavid J Betsie Peckman PT DPT  05/26/2018, 4:15 PM  [facility] [address] [place], [place], [address] Phone: [phone]   Fax:  [phone]  Name: Joan Dawson MRN: [id number] Date of Birth: June 30, 1990

## 2018-05-31 ENCOUNTER — Ambulatory Visit: Payer: Medicaid Other | Admitting: Physical Therapy

## 2018-06-11 ENCOUNTER — Ambulatory Visit (INDEPENDENT_AMBULATORY_CARE_PROVIDER_SITE_OTHER): Payer: Medicaid Other | Admitting: Advanced Practice Midwife

## 2018-06-11 VITALS — BP 126/81 | HR 87 | Wt 250.0 lb

## 2018-06-11 DIAGNOSIS — Z3490 Encounter for supervision of normal pregnancy, unspecified, unspecified trimester: Secondary | ICD-10-CM

## 2018-06-11 DIAGNOSIS — Z3A27 27 weeks gestation of pregnancy: Secondary | ICD-10-CM

## 2018-06-11 DIAGNOSIS — Z3492 Encounter for supervision of normal pregnancy, unspecified, second trimester: Secondary | ICD-10-CM

## 2018-06-11 NOTE — Patient Instructions (Addendum)
[email or web address]   Considering Doren Custard? Guide for patients at [facility] [employer]  Why consider waterbirth?  . Gentle birth for babies . Less pain medicine used in labor . May allow for passive descent/less pushing . May reduce perineal tears  . More mobility and instinctive maternal position changes . Increased maternal relaxation . Reduced blood pressure in labor  Is waterbirth safe? What are the risks of infection, drowning or other complications?  . Infection: o Very low risk (3.7 % for tub vs 4.8% for bed) o 7 in 8000 waterbirths with documented infection o Poorly cleaned equipment most common cause o Slightly lower group B strep transmission rate  . Drowning o Maternal:  - Very low risk   - Related to seizures or fainting o Newborn:  - Very low risk. No evidence of increased risk of respiratory problems in multiple large studies - Physiological protection from breathing under water - Avoid underwater birth if there are any fetal complications - Once baby's head is out of the water, keep it out.  . Birth complication o Some reports of cord trauma, but risk decreased by bringing baby to surface gradually o No evidence of increased risk of shoulder dystocia. Mothers can usually change positions faster in water than in a bed, possibly aiding the maneuvers to free the shoulder.   You must attend a Doren Custard class at [facility]  3rd Wednesday of every month from 7-9pm  [employer] by calling [phone] or online at [email or web address]  Bring [place] the certificate from the class to your prenatal appointment  Meet with a midwife at 36 weeks to see if you can still plan a waterbirth and to sign the consent.   If you plan a waterbirth after June 16, 2018, at [facility], you will need to purchase the following:  Fish  net  Bathing suit top (optional)  Long-handled mirror (optional)  If you plan a waterbirth before June 16, 2018: Purchase or rent the following supplies: You are responsible for providing all supplies listed above. **If you do not have all necessary supplies you cannot have a waterbirth.**   Water Birth Pool (Birth Pool in a Box or [place] for instance)  (Tubs start ~$125)  Single-use disposable tub liner designed for your brand of tub  Electric drain pump to remove water (We recommend 792 gallon per hour or greater pump.)   New garden hose labeled "lead-free", "suitable for drinking water",  Separate garden hose to remove the dirty water  Fish net  Bathing suit top (optional)  Long-handled mirror (optional)  Places to purchase or rent supplies:   [employer] for tub purchases and supplies  [employer]com for tub purchases and supplies  [employer] (www.thelaborladies.com) $275 for tub rental/set-up & take down/kit   [employer] (http://www.fleming.com/.htm) Information regarding doulas (labor support) who provide pool rentals  Things that would prevent you from having a waterbirth:  Premature, <37wks  Previous cesarean birth  Presence of thick meconium-stained fluid  Multiple gestation (Twins, triplets, etc.)  Uncontrolled diabetes or gestational diabetes requiring medication  Hypertension requiring medication or diagnosis of pre-eclampsia  Heavy vaginal bleeding  Non-reassuring fetal heart rate  Active infection (MRSA, etc.). Group B Strep is NOT a contraindication for waterbirth.  If your labor has to be induced and induction method requires continuous monitoring of the baby's heart rate  Other risks/issues identified by your obstetrical provider  Please remember that birth is unpredictable. Under certain  unforeseeable circumstances your provider may advise against giving birth in the tub. These  decisions will be made on a case-by-case basis and with the safety of you and your baby as our highest priority.

## 2018-06-11 NOTE — Progress Notes (Signed)
   PRENATAL VISIT NOTE  Subjective:  Joan Dawson is a 28 y.o. G1P0 at [redacted]w[redacted]d being seen today for ongoing prenatal care.  She is currently monitored for the following issues for this low-risk pregnancy and has Supervision of normal pregnancy, antepartum; History of migraine; and Chlamydia infection affecting pregnancy, antepartum on their problem list.  Patient reports no complaints.  Contractions: Not present. Vag. Bleeding: None.  Movement: Present. Denies leaking of fluid.   The following portions of the patient's history were reviewed and updated as appropriate: allergies, current medications, past family history, past medical history, past social history, past surgical history and problem list. Problem list updated.  Objective:   Vitals:   06/11/18 0815  BP: 126/81  Pulse: 87  Weight: 113.4 kg    Fetal Status: Fetal Heart Rate (bpm): 130   Movement: Present     General:  Alert, oriented and cooperative. Patient is in no acute distress.  Skin: Skin is warm and dry. No rash noted.   Cardiovascular: Normal heart rate noted  Respiratory: Normal respiratory effort, no problems with respiration noted  Abdomen: Soft, gravid, appropriate for gestational age.  Pain/Pressure: Absent     Pelvic: Cervical exam deferred        Extremities: Normal range of motion.     Mental Status: Normal mood and affect. Normal behavior. Normal judgment and thought content.   Assessment and Plan:  Pregnancy: G1P0 at [redacted]w[redacted]d  1. Encounter for supervision of normal pregnancy, antepartum, unspecified gravidity --Anticipatory guidance about next visits/weeks of pregnancy given. --Interest in waterbirth. Discussed.  Information printed for pt.  Pt to enroll in informational class.   Preterm labor symptoms and general obstetric precautions including but not limited to vaginal bleeding, contractions, leaking of fluid and fetal movement were reviewed in detail with the patient. Please refer to After Visit  Summary for other counseling recommendations.  Return in about 2 weeks (around 06/25/2018).  Future Appointments  Date Time Provider Department Center  06/25/2018  8:15 AM Leftwich-Kirby, Wilmer Floor, CNM [facility] None    Sharen Counter, CNM

## 2018-06-25 ENCOUNTER — Ambulatory Visit (INDEPENDENT_AMBULATORY_CARE_PROVIDER_SITE_OTHER): Payer: Medicaid Other | Admitting: Advanced Practice Midwife

## 2018-06-25 VITALS — BP 110/72 | HR 87 | Wt 254.0 lb

## 2018-06-25 DIAGNOSIS — Z23 Encounter for immunization: Secondary | ICD-10-CM | POA: Diagnosis not present

## 2018-06-25 DIAGNOSIS — Z3490 Encounter for supervision of normal pregnancy, unspecified, unspecified trimester: Secondary | ICD-10-CM

## 2018-06-25 DIAGNOSIS — Z3A29 29 weeks gestation of pregnancy: Secondary | ICD-10-CM

## 2018-06-25 DIAGNOSIS — Z3493 Encounter for supervision of normal pregnancy, unspecified, third trimester: Secondary | ICD-10-CM

## 2018-06-25 NOTE — Patient Instructions (Addendum)
Third Trimester of Pregnancy The third trimester is from week 28 through week 40 (months 7 through 9). The third trimester is a time when the unborn baby (fetus) is growing rapidly. At the end of the ninth month, the fetus is about 20 inches in length and weighs 6-10 pounds. Body changes during your third trimester Your body will continue to go through many changes during pregnancy. The changes vary from woman to woman. During the third trimester:  Your weight will continue to increase. You can expect to gain 25-35 pounds (11-16 kg) by the end of the pregnancy.  You may begin to get stretch marks on your hips, abdomen, and breasts.  You may urinate more often because the fetus is moving lower into your pelvis and pressing on your bladder.  You may develop or continue to have heartburn. This is caused by increased hormones that slow down muscles in the digestive tract.  You may develop or continue to have constipation because increased hormones slow digestion and cause the muscles that push waste through your intestines to relax.  You may develop hemorrhoids. These are swollen veins (varicose veins) in the rectum that can itch or be painful.  You may develop swollen, bulging veins (varicose veins) in your legs.  You may have increased body aches in the pelvis, back, or thighs. This is due to weight gain and increased hormones that are relaxing your joints.  You may have changes in your hair. These can include thickening of your hair, rapid growth, and changes in texture. Some women also have hair loss during or after pregnancy, or hair that feels dry or thin. Your hair will most likely return to normal after your baby is born.  Your breasts will continue to grow and they will continue to become tender. A yellow fluid (colostrum) may leak from your breasts. This is the first milk you are producing for your baby.  Your belly button may stick out.  You may notice more swelling in your hands,  face, or ankles.  You may have increased tingling or numbness in your hands, arms, and legs. The skin on your belly may also feel numb.  You may feel short of breath because of your expanding uterus.  You may have more problems sleeping. This can be caused by the size of your belly, increased need to urinate, and an increase in your body's metabolism.  You may notice the fetus "dropping," or moving lower in your abdomen (lightening).  You may have increased vaginal discharge.  You may notice your joints feel loose and you may have pain around your pelvic bone. What to expect at prenatal visits You will have prenatal exams every 2 weeks until week 36. Then you will have weekly prenatal exams. During a routine prenatal visit:  You will be weighed to make sure you and the baby are growing normally.  Your blood pressure will be taken.  Your abdomen will be measured to track your baby's growth.  The fetal heartbeat will be listened to.  Any test results from the previous visit will be discussed.  You may have a cervical check near your due date to see if your cervix has softened or thinned (effaced).  You will be tested for Group B streptococcus. This happens between 35 and 37 weeks. Your health care provider may ask you:  What your birth plan is.  How you are feeling.  If you are feeling the baby move.  If you have had any abnormal   symptoms, such as leaking fluid, bleeding, severe headaches, or abdominal cramping.  If you are using any tobacco products, including cigarettes, chewing tobacco, and electronic cigarettes.  If you have any questions. Other tests or screenings that may be performed during your third trimester include:  Blood tests that check for low iron levels (anemia).  Fetal testing to check the health, activity level, and growth of the fetus. Testing is done if you have certain medical conditions or if there are problems during the pregnancy.  Nonstress test  (NST). This test checks the health of your baby to make sure there are no signs of problems, such as the baby not getting enough oxygen. During this test, a belt is placed around your belly. The baby is made to move, and its heart rate is monitored during movement. What is false labor? False labor is a condition in which you feel small, irregular tightenings of the muscles in the womb (contractions) that usually go away with rest, changing position, or drinking water. These are called Braxton Hicks contractions. Contractions may last for hours, days, or even weeks before true labor sets in. If contractions come at regular intervals, become more frequent, increase in intensity, or become painful, you should see your health care provider. What are the signs of labor?  Abdominal cramps.  Regular contractions that start at 10 minutes apart and become stronger and more frequent with time.  Contractions that start on the top of the uterus and spread down to the lower abdomen and back.  Increased pelvic pressure and dull back pain.  A watery or bloody mucus discharge that comes from the vagina.  Leaking of amniotic fluid. This is also known as your "water breaking." It could be a slow trickle or a gush. Let your health care provider know if it has a color or strange odor. If you have any of these signs, call your health care provider right away, even if it is before your due date. Follow these instructions at home: Medicines  Follow your health care provider's instructions regarding medicine use. Specific medicines may be either safe or unsafe to take during pregnancy.  Take a prenatal vitamin that contains at least 600 micrograms (mcg) of folic acid.  If you develop constipation, try taking a stool softener if your health care provider approves. Eating and drinking   Eat a balanced diet that includes fresh fruits and vegetables, whole grains, good sources of protein such as meat, eggs, or tofu,  and low-fat dairy. Your health care provider will help you determine the amount of weight gain that is right for you.  Avoid raw meat and uncooked cheese. These carry germs that can cause birth defects in the baby.  If you have low calcium intake from food, talk to your health care provider about whether you should take a daily calcium supplement.  Eat four or five small meals rather than three large meals a day.  Limit foods that are high in fat and processed sugars, such as fried and sweet foods.  To prevent constipation: ? Drink enough fluid to keep your urine clear or pale yellow. ? Eat foods that are high in fiber, such as fresh fruits and vegetables, whole grains, and beans. Activity  Exercise only as directed by your health care provider. Most women can continue their usual exercise routine during pregnancy. Try to exercise for 30 minutes at least 5 days a week. Stop exercising if you experience uterine contractions.  Avoid heavy lifting.  Do   not exercise in extreme heat or humidity, or at high altitudes.  Wear low-heel, comfortable shoes.  Practice good posture.  You may continue to have sex unless your health care provider tells you otherwise. Relieving pain and discomfort  Take frequent breaks and rest with your legs elevated if you have leg cramps or low back pain.  Take warm sitz baths to soothe any pain or discomfort caused by hemorrhoids. Use hemorrhoid cream if your health care provider approves.  Wear a good support bra to prevent discomfort from breast tenderness.  If you develop varicose veins: ? Wear support pantyhose or compression stockings as told by your healthcare provider. ? Elevate your feet for 15 minutes, 3-4 times a day. Prenatal care  Write down your questions. Take them to your prenatal visits.  Keep all your prenatal visits as told by your health care provider. This is important. Safety  Wear your seat belt at all times when driving.  Make  a list of emergency phone numbers, including numbers for family, friends, the hospital, and police and fire departments. General instructions  Avoid cat litter boxes and soil used by cats. These carry germs that can cause birth defects in the baby. If you have a cat, ask someone to clean the litter box for you.  Do not travel far distances unless it is absolutely necessary and only with the approval of your health care provider.  Do not use hot tubs, steam rooms, or saunas.  Do not drink alcohol.  Do not use any products that contain nicotine or tobacco, such as cigarettes and e-cigarettes. If you need help quitting, ask your health care provider.  Do not use any medicinal herbs or unprescribed drugs. These chemicals affect the formation and growth of the baby.  Do not douche or use tampons or scented sanitary pads.  Do not cross your legs for long periods of time.  To prepare for the arrival of your baby: ? Take prenatal classes to understand, practice, and ask questions about labor and delivery. ? Make a trial run to the hospital. ? Visit the hospital and tour the maternity area. ? Arrange for maternity or paternity leave through employers. ? Arrange for family and friends to take care of pets while you are in the hospital. ? Purchase a rear-facing car seat and make sure you know how to install it in your car. ? Pack your hospital bag. ? Prepare the baby's nursery. Make sure to remove all pillows and stuffed animals from the baby's crib to prevent suffocation.  Visit your dentist if you have not gone during your pregnancy. Use a soft toothbrush to brush your teeth and be gentle when you floss. Contact a health care provider if:  You are unsure if you are in labor or if your water has broken.  You become dizzy.  You have mild pelvic cramps, pelvic pressure, or nagging pain in your abdominal area.  You have lower back pain.  You have persistent nausea, vomiting, or  diarrhea.  You have an unusual or bad smelling vaginal discharge.  You have pain when you urinate. Get help right away if:  Your water breaks before 37 weeks.  You have regular contractions less than 5 minutes apart before 37 weeks.  You have a fever.  You are leaking fluid from your vagina.  You have spotting or bleeding from your vagina.  You have severe abdominal pain or cramping.  You have rapid weight loss or weight gain.  You have   shortness of breath with chest pain.  You notice sudden or extreme swelling of your face, hands, ankles, feet, or legs.  Your baby makes fewer than 10 movements in 2 hours.  You have severe headaches that do not go away when you take medicine.  You have vision changes. Summary  The third trimester is from week 28 through week 40, months 7 through 9. The third trimester is a time when the unborn baby (fetus) is growing rapidly.  During the third trimester, your discomfort may increase as you and your baby continue to gain weight. You may have abdominal, leg, and back pain, sleeping problems, and an increased need to urinate.  During the third trimester your breasts will keep growing and they will continue to become tender. A yellow fluid (colostrum) may leak from your breasts. This is the first milk you are producing for your baby.  False labor is a condition in which you feel small, irregular tightenings of the muscles in the womb (contractions) that eventually go away. These are called Braxton Hicks contractions. Contractions may last for hours, days, or even weeks before true labor sets in.  Signs of labor can include: abdominal cramps; regular contractions that start at 10 minutes apart and become stronger and more frequent with time; watery or bloody mucus discharge that comes from the vagina; increased pelvic pressure and dull back pain; and leaking of amniotic fluid. This information is not intended to replace advice given to you by your  health care provider. Make sure you discuss any questions you have with your health care provider. Document Released: 04/04/2001 Document Revised: 05/16/2016 Document Reviewed: 05/16/2016 Elsevier Interactive Patient Education  2019 [employer]. Tdap Vaccine (Tetanus, Diphtheria and Pertussis): What You Need to Know 1. Why get vaccinated? Tetanus, diphtheria and pertussis are very serious diseases. Tdap vaccine can protect [place] from these diseases. And, Tdap vaccine given to pregnant women can protect newborn babies against pertussis.Marland Kitchen TETANUS (Lockjaw) is rare in the [place] today. It causes painful muscle tightening and stiffness, usually all over the body.  It can lead to tightening of muscles in the head and neck so you can't open your mouth, swallow, or sometimes even breathe. Tetanus kills about 1 out of 10 people who are infected even after receiving the best medical care. DIPHTHERIA is also rare in the [place] today. It can cause a thick coating to form in the back of the throat.  It can lead to breathing problems, heart failure, paralysis, and death. PERTUSSIS (Whooping Cough) causes severe coughing spells, which can cause difficulty breathing, vomiting and disturbed sleep.  It can also lead to weight loss, incontinence, and rib fractures. Up to 2 in 100 adolescents and 5 in 100 adults with pertussis are hospitalized or have complications, which could include pneumonia or death. These diseases are caused by bacteria. Diphtheria and pertussis are spread from person to person through secretions from coughing or sneezing. Tetanus enters the body through cuts, scratches, or wounds. Before vaccines, as many as 200,000 cases of diphtheria, 200,000 cases of pertussis, and hundreds of cases of tetanus, were reported in the [place] each year. Since vaccination began, reports of cases for tetanus and diphtheria have dropped by about 99% and for pertussis by about 80%. 2. Tdap  vaccine Tdap vaccine can protect adolescents and adults from tetanus, diphtheria, and pertussis. One dose of Tdap is routinely given at age 60 or 35. People who did not get Tdap at that age should get  it as soon as possible. Tdap is especially important for healthcare professionals and anyone having close contact with a baby younger than 12 months. Pregnant women should get a dose of Tdap during every pregnancy, to protect the newborn from pertussis. Infants are most at risk for severe, life-threatening complications from pertussis. Another vaccine, called Td, protects against tetanus and diphtheria, but not pertussis. A Td booster should be given every 10 years. Tdap may be given as one of these boosters if you have never gotten Tdap before. Tdap may also be given after a severe cut or burn to prevent tetanus infection. Your doctor or the person giving you the vaccine can give you more information. Tdap may safely be given at the same time as other vaccines. 3. Some people should not get this vaccine  A person who has ever had a life-threatening allergic reaction after a previous dose of any diphtheria, tetanus or pertussis containing vaccine, OR has a severe allergy to any part of this vaccine, should not get Tdap vaccine. Tell the person giving the vaccine about any severe allergies.  Anyone who had coma or long repeated seizures within 7 days after a childhood dose of DTP or DTaP, or a previous dose of Tdap, should not get Tdap, unless a cause other than the vaccine was found. They can still get Td.  Talk to your doctor if you: ? have seizures or another nervous system problem, ? had severe pain or swelling after any vaccine containing diphtheria, tetanus or pertussis, ? ever had a condition called Guillain-Barr Syndrome (GBS), ? aren't feeling well on the day the shot is scheduled. 4. Risks With any medicine, including vaccines, there is a chance of side effects. These are usually mild and  go away on their own. Serious reactions are also possible but are rare. Most people who get Tdap vaccine do not have any problems with it. Mild problems following Tdap (Did not interfere with activities)  Pain where the shot was given (about 3 in 4 adolescents or 2 in 3 adults)  Redness or swelling where the shot was given (about 1 person in 5)  Mild fever of at least 100.25F (up to about 1 in 25 adolescents or 1 in 100 adults)  Headache (about 3 or 4 people in 10)  Tiredness (about 1 person in 3 or 4)  Nausea, vomiting, diarrhea, stomach ache (up to 1 in 4 adolescents or 1 in 10 adults)  Chills, sore joints (about 1 person in 10)  Body aches (about 1 person in 3 or 4)  Rash, swollen glands (uncommon) Moderate problems following Tdap (Interfered with activities, but did not require medical attention)  Pain where the shot was given (up to 1 in 5 or 6)  Redness or swelling where the shot was given (up to about 1 in 16 adolescents or 1 in 12 adults)  Fever over 102F (about 1 in 100 adolescents or 1 in 250 adults)  Headache (about 1 in 7 adolescents or 1 in 10 adults)  Nausea, vomiting, diarrhea, stomach ache (up to 1 or 3 people in 100)  Swelling of the entire arm where the shot was given (up to about 1 in 500). Severe problems following Tdap (Unable to perform usual activities; required medical attention)  Swelling, severe pain, bleeding and redness in the arm where the shot was given (rare). Problems that could happen after any vaccine:  People sometimes faint after a medical procedure, including vaccination. Sitting or lying down for about  15 minutes can help prevent fainting, and injuries caused by a fall. Tell your doctor if you feel dizzy, or have vision changes or ringing in the ears.  Some people get severe pain in the shoulder and have difficulty moving the arm where a shot was given. This happens very rarely.  Any medication can cause a severe allergic reaction.  Such reactions from a vaccine are very rare, estimated at fewer than 1 in a million doses, and would happen within a few minutes to a few hours after the vaccination. As with any medicine, there is a very remote chance of a vaccine causing a serious injury or death. The safety of vaccines is always being monitored. For more information, visit: http://floyd.org/ 5. What if there is a serious problem? What should I look for?  Look for anything that concerns you, such as signs of a severe allergic reaction, very high fever, or unusual behavior. Signs of a severe allergic reaction can include hives, swelling of the face and throat, difficulty breathing, a fast heartbeat, dizziness, and weakness. These would usually start a few minutes to a few hours after the vaccination. What should I do?  If you think it is a severe allergic reaction or other emergency that can't wait, call 9-1-1 or get the person to the nearest hospital. Otherwise, call your doctor.  Afterward, the reaction should be reported to the Vaccine Adverse Event Reporting System (VAERS). Your doctor might file this report, or you can do it yourself through the VAERS web site at www.vaers.LAgents.no, or by calling 1-628-538-7332. VAERS does not give medical advice. 6. The National Vaccine Injury Compensation Program The [employer] Vaccine Injury Compensation Program (VICP) is a federal program that was created to compensate people who may have been injured by certain vaccines. Persons who believe they may have been injured by a vaccine can learn about the program and about filing a claim by calling 1-(479) 434-7156 or visiting the VICP website at SpiritualWord.at. There is a time limit to file a claim for compensation. 7. How can I learn more?  Ask your doctor. He or she can give you the vaccine package insert or suggest other sources of information.  Call your local or state health department.  Contact the Centers for  Disease Control and Prevention (CDC): ? Call 508-641-9837 (1-800-CDC-INFO) or ? Visit CDC's website at PicCapture.uy Vaccine Information Statement Tdap Vaccine (06/17/2013) This information is not intended to replace advice given to you by your health care provider. Make sure you discuss any questions you have with your health care provider. Document Released: 10/10/2011 Document Revised: 11/26/2017 Document Reviewed: 11/26/2017 Elsevier Interactive Patient Education  2019 [employer].

## 2018-06-25 NOTE — Progress Notes (Signed)
   PRENATAL VISIT NOTE  Subjective:  Joan Dawson is a 28 y.o. G1P0 at [redacted]w[redacted]d being seen today for ongoing prenatal care.  She is currently monitored for the following issues for this low-risk pregnancy and has Supervision of normal pregnancy, antepartum; History of migraine; and Chlamydia infection affecting pregnancy, antepartum on their problem list.  Patient reports no complaints.  Contractions: Not present. Vag. Bleeding: None.  Movement: Present. Denies leaking of fluid.   The following portions of the patient's history were reviewed and updated as appropriate: allergies, current medications, past family history, past medical history, past social history, past surgical history and problem list. Problem list updated.  Objective:   Vitals:   06/25/18 0819  BP: 110/72  Pulse: 87  Weight: 115.2 kg    Fetal Status: Fetal Heart Rate (bpm): 150   Movement: Present     General:  Alert, oriented and cooperative. Patient is in no acute distress.  Skin: Skin is warm and dry. No rash noted.   Cardiovascular: Normal heart rate noted  Respiratory: Normal respiratory effort, no problems with respiration noted  Abdomen: Soft, gravid, appropriate for gestational age.  Pain/Pressure: Absent     Pelvic: Cervical exam deferred        Extremities: Normal range of motion.  Edema: None  Mental Status: Normal mood and affect. Normal behavior. Normal judgment and thought content.   Assessment and Plan:  Pregnancy: G1P0 at [redacted]w[redacted]d  1. Encounter for supervision of normal pregnancy, antepartum, unspecified gravidity --Anticipatory guidance about next visits/weeks of pregnancy given.   Preterm labor symptoms and general obstetric precautions including but not limited to vaginal bleeding, contractions, leaking of fluid and fetal movement were reviewed in detail with the patient. Please refer to After Visit Summary for other counseling recommendations.  Return in about 2 weeks (around 07/09/2018).  No  future appointments.  Sharen Counter, CNM

## 2018-06-26 LAB — CBC
HEMOGLOBIN: 11 g/dL — AB (ref 11.1–15.9)
Hematocrit: 32.5 % — ABNORMAL LOW (ref 34.0–46.6)
MCH: 31.6 pg (ref 26.6–33.0)
MCHC: 33.8 g/dL (ref 31.5–35.7)
MCV: 93 fL (ref 79–97)
Platelets: 265 10*3/uL (ref 150–450)
RBC: 3.48 x10E6/uL — ABNORMAL LOW (ref 3.77–5.28)
RDW: 11.8 % (ref 11.7–15.4)
WBC: 11.5 10*3/uL — ABNORMAL HIGH (ref 3.4–10.8)

## 2018-06-26 LAB — GLUCOSE TOLERANCE, 2 HOURS W/ 1HR
GLUCOSE, 2 HOUR: 107 mg/dL (ref 65–152)
Glucose, 1 hour: 110 mg/dL (ref 65–179)
Glucose, Fasting: 77 mg/dL (ref 65–91)

## 2018-06-26 LAB — RPR: RPR Ser Ql: NONREACTIVE

## 2018-06-26 LAB — HIV ANTIBODY (ROUTINE TESTING W REFLEX): HIV Screen 4th Generation wRfx: NONREACTIVE

## 2018-07-08 NOTE — Patient Instructions (Signed)
Third Trimester of Pregnancy The third trimester is from week 28 through week 40 (months 7 through 9). The third trimester is a time when the unborn baby (fetus) is growing rapidly. At the end of the ninth month, the fetus is about 20 inches in length and weighs 6-10 pounds. Body changes during your third trimester Your body will continue to go through many changes during pregnancy. The changes vary from woman to woman. During the third trimester:  Your weight will continue to increase. You can expect to gain 25-35 pounds (11-16 kg) by the end of the pregnancy.  You may begin to get stretch marks on your hips, abdomen, and breasts.  You may urinate more often because the fetus is moving lower into your pelvis and pressing on your bladder.  You may develop or continue to have heartburn. This is caused by increased hormones that slow down muscles in the digestive tract.  You may develop or continue to have constipation because increased hormones slow digestion and cause the muscles that push waste through your intestines to relax.  You may develop hemorrhoids. These are swollen veins (varicose veins) in the rectum that can itch or be painful.  You may develop swollen, bulging veins (varicose veins) in your legs.  You may have increased body aches in the pelvis, back, or thighs. This is due to weight gain and increased hormones that are relaxing your joints.  You may have changes in your hair. These can include thickening of your hair, rapid growth, and changes in texture. Some women also have hair loss during or after pregnancy, or hair that feels dry or thin. Your hair will most likely return to normal after your baby is born.  Your breasts will continue to grow and they will continue to become tender. A yellow fluid (colostrum) may leak from your breasts. This is the first milk you are producing for your baby.  Your belly button may stick out.  You may notice more swelling in your hands,  face, or ankles.  You may have increased tingling or numbness in your hands, arms, and legs. The skin on your belly may also feel numb.  You may feel short of breath because of your expanding uterus.  You may have more problems sleeping. This can be caused by the size of your belly, increased need to urinate, and an increase in your body's metabolism.  You may notice the fetus "dropping," or moving lower in your abdomen (lightening).  You may have increased vaginal discharge.  You may notice your joints feel loose and you may have pain around your pelvic bone. What to expect at prenatal visits You will have prenatal exams every 2 weeks until week 36. Then you will have weekly prenatal exams. During a routine prenatal visit:  You will be weighed to make sure you and the baby are growing normally.  Your blood pressure will be taken.  Your abdomen will be measured to track your baby's growth.  The fetal heartbeat will be listened to.  Any test results from the previous visit will be discussed.  You may have a cervical check near your due date to see if your cervix has softened or thinned (effaced).  You will be tested for Group B streptococcus. This happens between 35 and 37 weeks. Your health care provider may ask you:  What your birth plan is.  How you are feeling.  If you are feeling the baby move.  If you have had any abnormal   symptoms, such as leaking fluid, bleeding, severe headaches, or abdominal cramping.  If you are using any tobacco products, including cigarettes, chewing tobacco, and electronic cigarettes.  If you have any questions. Other tests or screenings that may be performed during your third trimester include:  Blood tests that check for low iron levels (anemia).  Fetal testing to check the health, activity level, and growth of the fetus. Testing is done if you have certain medical conditions or if there are problems during the pregnancy.  Nonstress test  (NST). This test checks the health of your baby to make sure there are no signs of problems, such as the baby not getting enough oxygen. During this test, a belt is placed around your belly. The baby is made to move, and its heart rate is monitored during movement. What is false labor? False labor is a condition in which you feel small, irregular tightenings of the muscles in the womb (contractions) that usually go away with rest, changing position, or drinking water. These are called Braxton Hicks contractions. Contractions may last for hours, days, or even weeks before true labor sets in. If contractions come at regular intervals, become more frequent, increase in intensity, or become painful, you should see your health care provider. What are the signs of labor?  Abdominal cramps.  Regular contractions that start at 10 minutes apart and become stronger and more frequent with time.  Contractions that start on the top of the uterus and spread down to the lower abdomen and back.  Increased pelvic pressure and dull back pain.  A watery or bloody mucus discharge that comes from the vagina.  Leaking of amniotic fluid. This is also known as your "water breaking." It could be a slow trickle or a gush. Let your health care provider know if it has a color or strange odor. If you have any of these signs, call your health care provider right away, even if it is before your due date. Follow these instructions at home: Medicines  Follow your health care provider's instructions regarding medicine use. Specific medicines may be either safe or unsafe to take during pregnancy.  Take a prenatal vitamin that contains at least 600 micrograms (mcg) of folic acid.  If you develop constipation, try taking a stool softener if your health care provider approves. Eating and drinking   Eat a balanced diet that includes fresh fruits and vegetables, whole grains, good sources of protein such as meat, eggs, or tofu,  and low-fat dairy. Your health care provider will help you determine the amount of weight gain that is right for you.  Avoid raw meat and uncooked cheese. These carry germs that can cause birth defects in the baby.  If you have low calcium intake from food, talk to your health care provider about whether you should take a daily calcium supplement.  Eat four or five small meals rather than three large meals a day.  Limit foods that are high in fat and processed sugars, such as fried and sweet foods.  To prevent constipation: ? Drink enough fluid to keep your urine clear or pale yellow. ? Eat foods that are high in fiber, such as fresh fruits and vegetables, whole grains, and beans. Activity  Exercise only as directed by your health care provider. Most women can continue their usual exercise routine during pregnancy. Try to exercise for 30 minutes at least 5 days a week. Stop exercising if you experience uterine contractions.  Avoid heavy lifting.  Do   not exercise in extreme heat or humidity, or at high altitudes.  Wear low-heel, comfortable shoes.  Practice good posture.  You may continue to have sex unless your health care provider tells you otherwise. Relieving pain and discomfort  Take frequent breaks and rest with your legs elevated if you have leg cramps or low back pain.  Take warm sitz baths to soothe any pain or discomfort caused by hemorrhoids. Use hemorrhoid cream if your health care provider approves.  Wear a good support bra to prevent discomfort from breast tenderness.  If you develop varicose veins: ? Wear support pantyhose or compression stockings as told by your healthcare provider. ? Elevate your feet for 15 minutes, 3-4 times a day. Prenatal care  Write down your questions. Take them to your prenatal visits.  Keep all your prenatal visits as told by your health care provider. This is important. Safety  Wear your seat belt at all times when driving.  Make  a list of emergency phone numbers, including numbers for family, friends, the hospital, and police and fire departments. General instructions  Avoid cat litter boxes and soil used by cats. These carry germs that can cause birth defects in the baby. If you have a cat, ask someone to clean the litter box for you.  Do not travel far distances unless it is absolutely necessary and only with the approval of your health care provider.  Do not use hot tubs, steam rooms, or saunas.  Do not drink alcohol.  Do not use any products that contain nicotine or tobacco, such as cigarettes and e-cigarettes. If you need help quitting, ask your health care provider.  Do not use any medicinal herbs or unprescribed drugs. These chemicals affect the formation and growth of the baby.  Do not douche or use tampons or scented sanitary pads.  Do not cross your legs for long periods of time.  To prepare for the arrival of your baby: ? Take prenatal classes to understand, practice, and ask questions about labor and delivery. ? Make a trial run to the hospital. ? Visit the hospital and tour the maternity area. ? Arrange for maternity or paternity leave through employers. ? Arrange for family and friends to take care of pets while you are in the hospital. ? Purchase a rear-facing car seat and make sure you know how to install it in your car. ? Pack your hospital bag. ? Prepare the baby's nursery. Make sure to remove all pillows and stuffed animals from the baby's crib to prevent suffocation.  Visit your dentist if you have not gone during your pregnancy. Use a soft toothbrush to brush your teeth and be gentle when you floss. Contact a health care provider if:  You are unsure if you are in labor or if your water has broken.  You become dizzy.  You have mild pelvic cramps, pelvic pressure, or nagging pain in your abdominal area.  You have lower back pain.  You have persistent nausea, vomiting, or  diarrhea.  You have an unusual or bad smelling vaginal discharge.  You have pain when you urinate. Get help right away if:  Your water breaks before 37 weeks.  You have regular contractions less than 5 minutes apart before 37 weeks.  You have a fever.  You are leaking fluid from your vagina.  You have spotting or bleeding from your vagina.  You have severe abdominal pain or cramping.  You have rapid weight loss or weight gain.  You have   shortness of breath with chest pain.  You notice sudden or extreme swelling of your face, hands, ankles, feet, or legs.  Your baby makes fewer than 10 movements in 2 hours.  You have severe headaches that do not go away when you take medicine.  You have vision changes. Summary  The third trimester is from week 28 through week 40, months 7 through 9. The third trimester is a time when the unborn baby (fetus) is growing rapidly.  During the third trimester, your discomfort may increase as you and your baby continue to gain weight. You may have abdominal, leg, and back pain, sleeping problems, and an increased need to urinate.  During the third trimester your breasts will keep growing and they will continue to become tender. A yellow fluid (colostrum) may leak from your breasts. This is the first milk you are producing for your baby.  False labor is a condition in which you feel small, irregular tightenings of the muscles in the womb (contractions) that eventually go away. These are called Braxton Hicks contractions. Contractions may last for hours, days, or even weeks before true labor sets in.  Signs of labor can include: abdominal cramps; regular contractions that start at 10 minutes apart and become stronger and more frequent with time; watery or bloody mucus discharge that comes from the vagina; increased pelvic pressure and dull back pain; and leaking of amniotic fluid. This information is not intended to replace advice given to you by your  health care provider. Make sure you discuss any questions you have with your health care provider. Document Released: 04/04/2001 Document Revised: 05/16/2016 Document Reviewed: 05/16/2016 Elsevier Interactive Patient Education  2019 Else[employer] Inc[employer]  

## 2018-07-08 NOTE — Progress Notes (Addendum)
   PRENATAL VISIT NOTE  Subjective:  Joan Dawson is a 28 y.o. G1P0 at [redacted]w[redacted]d being seen today for ongoing prenatal care.  She is currently monitored for the following issues for this low-risk pregnancy and has Supervision of normal pregnancy, antepartum; History of migraine; and Chlamydia infection affecting pregnancy, antepartum on their problem list.  Patient reports no complaints.  Contractions: Not present. Vag. Bleeding: None.  Movement: Present. Denies leaking of fluid.   The following portions of the patient's history were reviewed and updated as appropriate: allergies, current medications, past family history, past medical history, past social history, past surgical history and problem list.   Objective:   Vitals:   07/09/18 1133  BP: 130/78  Pulse: 69  Weight: 117.6 kg    Fetal Status: Fetal Heart Rate (bpm): 141 Fundal Height: 31 cm Movement: Present     General:  Alert, oriented and cooperative. Patient is in no acute distress.  Skin: Skin is warm and dry. No rash noted.   Cardiovascular: Normal heart rate noted  Respiratory: Normal respiratory effort, no problems with respiration noted  Abdomen: Soft, gravid, appropriate for gestational age.  Pain/Pressure: Absent     Pelvic: Cervical exam deferred        Extremities: Normal range of motion.  Edema: None  Mental Status: Normal mood and affect. Normal behavior. Normal judgment and thought content.   Assessment and Plan:  Pregnancy: G1P0 at [redacted]w[redacted]d  1. Encounter for supervision of normal pregnancy, antepartum, unspecified gravidity --Anticipatory guidance about next visits/weeks of pregnancy given. --Pt enrolled in waterbirth class April 7. --Reviewed safety, visitor policy, reassurance about COVID-19 for pregnancy at this time.   Preterm labor symptoms and general obstetric precautions including but not limited to vaginal bleeding, contractions, leaking of fluid and fetal movement were reviewed in detail with the  patient. Please refer to After Visit Summary for other counseling recommendations.   Return in about 2 weeks (around 07/23/2018).  Future Appointments  Date Time Provider Department Center  07/23/2018  8:55 AM Leftwich-Kirby, Wilmer Floor, CNM [facility] None    Sharen Counter, CNM

## 2018-07-09 ENCOUNTER — Ambulatory Visit (INDEPENDENT_AMBULATORY_CARE_PROVIDER_SITE_OTHER): Payer: Medicaid Other | Admitting: Advanced Practice Midwife

## 2018-07-09 ENCOUNTER — Other Ambulatory Visit: Payer: Self-pay

## 2018-07-09 VITALS — BP 130/78 | HR 69 | Wt 259.2 lb

## 2018-07-09 DIAGNOSIS — Z3493 Encounter for supervision of normal pregnancy, unspecified, third trimester: Secondary | ICD-10-CM

## 2018-07-09 DIAGNOSIS — Z3A31 31 weeks gestation of pregnancy: Secondary | ICD-10-CM

## 2018-07-09 DIAGNOSIS — Z3490 Encounter for supervision of normal pregnancy, unspecified, unspecified trimester: Secondary | ICD-10-CM

## 2018-07-23 ENCOUNTER — Ambulatory Visit (INDEPENDENT_AMBULATORY_CARE_PROVIDER_SITE_OTHER): Payer: Medicaid Other | Admitting: Advanced Practice Midwife

## 2018-07-23 ENCOUNTER — Other Ambulatory Visit: Payer: Self-pay

## 2018-07-23 VITALS — BP 121/79 | HR 86 | Temp 98.2°F | Wt 259.1 lb

## 2018-07-23 DIAGNOSIS — O26893 Other specified pregnancy related conditions, third trimester: Secondary | ICD-10-CM

## 2018-07-23 DIAGNOSIS — R102 Pelvic and perineal pain: Secondary | ICD-10-CM

## 2018-07-23 DIAGNOSIS — N949 Unspecified condition associated with female genital organs and menstrual cycle: Secondary | ICD-10-CM

## 2018-07-23 DIAGNOSIS — Z3490 Encounter for supervision of normal pregnancy, unspecified, unspecified trimester: Secondary | ICD-10-CM

## 2018-07-23 DIAGNOSIS — A749 Chlamydial infection, unspecified: Secondary | ICD-10-CM

## 2018-07-23 DIAGNOSIS — O98813 Other maternal infectious and parasitic diseases complicating pregnancy, third trimester: Secondary | ICD-10-CM

## 2018-07-23 DIAGNOSIS — Z3A33 33 weeks gestation of pregnancy: Secondary | ICD-10-CM

## 2018-07-23 NOTE — Progress Notes (Signed)
Pt presents for a ROB. Pt has no complaints

## 2018-07-23 NOTE — Progress Notes (Signed)
   PRENATAL VISIT NOTE  Subjective:  Joan Dawson is a 28 y.o. G1P0 at [redacted]w[redacted]d being seen today for ongoing prenatal care.  She is currently monitored for the following issues for this low-risk pregnancy and has Supervision of normal pregnancy, antepartum; History of migraine; and Chlamydia infection affecting pregnancy, antepartum on their problem list.  Patient reports occasional bilateral lower abdominal/groin pain.  Contractions: Not present. Vag. Bleeding: None.  Movement: Present. Denies leaking of fluid.   The following portions of the patient's history were reviewed and updated as appropriate: allergies, current medications, past family history, past medical history, past social history, past surgical history and problem list.   Objective:   Vitals:   07/23/18 0902  BP: 121/79  Pulse: 86  Temp: 98.2 F (36.8 C)  Weight: 117.5 kg    Fetal Status: Fetal Heart Rate (bpm): 150   Movement: Present     General:  Alert, oriented and cooperative. Patient is in no acute distress.  Skin: Skin is warm and dry. No rash noted.   Cardiovascular: Normal heart rate noted  Respiratory: Normal respiratory effort, no problems with respiration noted  Abdomen: Soft, gravid, appropriate for gestational age.  Pain/Pressure: Absent     Pelvic: Cervical exam deferred        Extremities: Normal range of motion.  Edema: None  Mental Status: Normal mood and affect. Normal behavior. Normal judgment and thought content.   Assessment and Plan:  Pregnancy: G1P0 at [redacted]w[redacted]d  1. Encounter for supervision of normal pregnancy, antepartum, unspecified gravidity --Anticipatory guidance about next visits/weeks of pregnancy given. --Discussed suspension of waterbirth due to COVID-19 precautions.  Pt states understanding. --Reviewed safety, visitor policy, reassurance about COVID-19 for pregnancy at this time. Discussed possible changes to visits, including televisits, that may occur due to COVID-19.  The office  remains open if pt needs to be seen and MAU is open 24 hours/day for OB emergencies.   - Babyscripts Schedule Optimization  2. Round ligament pain --Occasional bilateral groin/low abdomen pain, mostly with movement. --Rest/ice/heat/warm bath/Tylenol/pregnancy support belt  Preterm labor symptoms and general obstetric precautions including but not limited to vaginal bleeding, contractions, leaking of fluid and fetal movement were reviewed in detail with the patient. Please refer to After Visit Summary for other counseling recommendations.   Return in about 2 weeks (around 08/06/2018).  No future appointments.  Sharen Counter, CNM

## 2018-07-23 NOTE — Patient Instructions (Signed)
Vaginal Bleeding During Pregnancy, Third Trimester ° °A small amount of bleeding from the vagina (spotting) is relatively common during pregnancy. Various things can cause bleeding or spotting during pregnancy. Sometimes bleeding is normal and is not a problem. However, bleeding during the third trimester can also be a sign of something serious for the mother and the baby. Be sure to tell your health care provider about any vaginal bleeding right away. °Some possible causes of vaginal bleeding during the third trimester include: °· Infection or growths (polyps) on the cervix. °· A condition in which the placenta partially or completely covers the opening of the cervix inside the uterus (placenta previa). °· The placenta separating from the uterus (placenta abruption). °· The start of labor (discharging of the mucus plug). °· A condition in which the placenta grows into the muscle layer of the uterus (placenta accreta). °Follow these instructions at home: °Activity °· Follow instructions from your health care provider about limiting your activity. If your health care provider recommends activity restriction, you may need to stay in bed and only get up to use the bathroom. In some cases, your health care provider may allow you to continue light activity. °· If needed, make plans for someone to help with your regular activities. °· Ask your health care provider if it is safe for you to drive. °· Do not lift anything that is heavier than 10 lb (4.5 kg), or the limit that your health care provider tells you, until he or she says that it is safe. °· Do not have sex or orgasms until your health care provider says that this is safe. °Medicines °· Take over-the-counter and prescription medicines only as told by your health care provider. °· Do not take aspirin because it can cause bleeding. °General instructions °· Pay attention to any changes in your symptoms. °· Write down how many pads you use each day, how often you  change pads, and how soaked (saturated) they are. °· Do not use tampons or douche. °· If you pass any tissue from your vagina, save the tissue so you can show it to your health care provider. °· Keep all follow-up visits as told by your health care provider. This is important. °Contact a health care provider if: °· You have vaginal bleeding during any part of your pregnancy. °· You have cramps or labor pains. °· You have a fever. °Get help right away if: °· You have severe cramps or pain in your back or abdomen. °· You have a gush of fluid from the vagina. °· You pass large clots or a large amount of tissue from your vagina. °· Your bleeding increases. °· You feel light-headed or weak. °· You faint. °· You feel that your baby is moving less than usual, or not moving at all. °Summary °· Various things can cause bleeding or spotting in pregnancy. °· Bleeding during the third trimester can be a sign of a serious problem for the mother and the baby. °· Be sure to tell your health care provider about any vaginal bleeding right away. °This information is not intended to replace advice given to you by your health care provider. Make sure you discuss any questions you have with your health care provider. °Document Released: 07/01/2002 Document Revised: 07/13/2016 Document Reviewed: 07/13/2016 °Elsevier Interactive Patient Education © 2019 Elsevier Inc. ° °

## 2018-08-06 ENCOUNTER — Other Ambulatory Visit: Payer: Self-pay

## 2018-08-06 ENCOUNTER — Ambulatory Visit (INDEPENDENT_AMBULATORY_CARE_PROVIDER_SITE_OTHER): Payer: Medicaid Other | Admitting: Advanced Practice Midwife

## 2018-08-06 ENCOUNTER — Telehealth: Payer: Self-pay

## 2018-08-06 VITALS — BP 115/79 | HR 74

## 2018-08-06 DIAGNOSIS — Z3A35 35 weeks gestation of pregnancy: Secondary | ICD-10-CM

## 2018-08-06 DIAGNOSIS — G479 Sleep disorder, unspecified: Secondary | ICD-10-CM

## 2018-08-06 DIAGNOSIS — O26893 Other specified pregnancy related conditions, third trimester: Secondary | ICD-10-CM

## 2018-08-06 DIAGNOSIS — R102 Pelvic and perineal pain: Secondary | ICD-10-CM | POA: Diagnosis not present

## 2018-08-06 DIAGNOSIS — Z3490 Encounter for supervision of normal pregnancy, unspecified, unspecified trimester: Secondary | ICD-10-CM

## 2018-08-06 NOTE — Progress Notes (Signed)
S/w pt via tele visit, pt reports fetal movement, denies pain. Pt took BP over the phone 115/79, pulse 74.

## 2018-08-06 NOTE — Telephone Encounter (Signed)
Contacted pt, left vm advising that babyrx is now set to manual entry for BP.

## 2018-08-06 NOTE — Progress Notes (Signed)
   TELEHEALTH VIRTUAL OBSTETRICS VISIT ENCOUNTER NOTE  I connected with Joan Dawson on 08/06/18 at  8:35 AM EDT by telephone at home and verified that I am speaking with the correct person using two identifiers.   I discussed the limitations, risks, security and privacy concerns of performing an evaluation and management service by telephone and the availability of in person appointments. I also discussed with the patient that there may be a patient responsible charge related to this service. The patient expressed understanding and agreed to proceed.  Subjective:  Joan Dawson is a 28 y.o. G1P0 at [redacted]w[redacted]d being followed for ongoing prenatal care.  She is currently monitored for the following issues for this low-risk pregnancy and has Supervision of normal pregnancy, antepartum; History of migraine; and Chlamydia infection affecting pregnancy, antepartum on their problem list.  Patient reports hip pain at night, trouble sleeping. Reports fetal movement. Denies any contractions, bleeding or leaking of fluid.   The following portions of the patient's history were reviewed and updated as appropriate: allergies, current medications, past family history, past medical history, past social history, past surgical history and problem list.   Objective:   General:  Alert, oriented and cooperative.   Mental Status: Normal mood and affect perceived. Normal judgment and thought content.  Rest of physical exam deferred due to type of encounter  Assessment and Plan:  Pregnancy: G1P0 at [redacted]w[redacted]d  1. Encounter for supervision of normal pregnancy, antepartum, unspecified gravidity --Anticipatory guidance about next visits/weeks of pregnancy given. --Reviewed safety, visitor policy, reassurance about COVID-19 for pregnancy at this time. Discussed possible changes to visits, including televisits, that may occur due to COVID-19.  The office remains open if pt needs to be seen and MAU is open 24 hours/day for OB  emergencies. --BP 115/79 today, pt unable to enter into Babyscripts.  No s/sx of PEC. Will investigate so pt can enter BP in system.    2. Disturbance of sleep --Pt waking up during the night, hard to get comfortable due to hip pain --Discussed sleep hygiene, use heat for musculoskeletal pain, Tylenol and Benadryl PRN  3. Pelvic pain affecting pregnancy in third trimester, antepartum --Pain is only at night when trying to sleep, aching pain, no regular pattern.  Pt to try comfort measures, see above, Tylenol if needed.  Preterm labor symptoms and general obstetric precautions including but not limited to vaginal bleeding, contractions, leaking of fluid and fetal movement were reviewed in detail with the patient.  I discussed the assessment and treatment plan with the patient. The patient was provided an opportunity to ask questions and all were answered. The patient agreed with the plan and demonstrated an understanding of the instructions. The patient was advised to call back or seek an in-person office evaluation/go to [facility]U at [facility] for any urgent or concerning symptoms. Please refer to After Visit Summary for other counseling recommendations.   I provided 10 minutes of non-face-to-face time during this encounter.  No follow-ups on file.  No future appointments.  Sharen Counter, CNM [facility] [employer], [facility]

## 2018-08-21 ENCOUNTER — Other Ambulatory Visit: Payer: Self-pay

## 2018-08-21 ENCOUNTER — Ambulatory Visit (INDEPENDENT_AMBULATORY_CARE_PROVIDER_SITE_OTHER): Payer: Medicaid Other

## 2018-08-21 ENCOUNTER — Other Ambulatory Visit (HOSPITAL_COMMUNITY)
Admission: RE | Admit: 2018-08-21 | Discharge: 2018-08-21 | Disposition: A | Discharge status: Home / Self Care | Payer: Medicaid Other | Source: Ambulatory Visit

## 2018-08-21 VITALS — BP 114/78 | HR 102 | Temp 98.4°F | Wt 265.0 lb

## 2018-08-21 DIAGNOSIS — Z3490 Encounter for supervision of normal pregnancy, unspecified, unspecified trimester: Secondary | ICD-10-CM | POA: Insufficient documentation

## 2018-08-21 DIAGNOSIS — Z3493 Encounter for supervision of normal pregnancy, unspecified, third trimester: Secondary | ICD-10-CM

## 2018-08-21 NOTE — Progress Notes (Signed)
ROB w/ complaints of not being able to sleep at night.

## 2018-08-21 NOTE — Progress Notes (Signed)
   PRENATAL VISIT NOTE  Subjective:  Joan Dawson is a 28 y.o. G1P0 at [redacted]w[redacted]d who presents today for routine prenatal care.  She is currently being monitored for supervision of a low-risk pregnancy with problems as listed below.  Patient has no pregnancy related concerns and endorses fetal movement.  She denies vaginal concerns including discharge, bleeding, leaking, itching, and burning. She does report some acid reflux, but uses pepcid with resolution of symptoms.  Patient does c/o insomnia, but reports she usually works from 1630-0300 and has not been to work in about one month.    Patient Active Problem List   Diagnosis Date Noted  . Chlamydia infection affecting pregnancy, antepartum 02/20/2018  . History of migraine 02/18/2018  . Supervision of normal pregnancy, antepartum 02/15/2018    The following portions of the patient's history were reviewed and updated as appropriate: allergies, current medications, past family history, past medical history, past social history, past surgical history and problem list. Problem list updated.  Objective:   Vitals:   08/21/18 0916  BP: 114/78  Pulse: (!) 102  Temp: 98.4 F (36.9 C)  Weight: 265 lb (120.2 kg)    Fetal Status: Fetal Heart Rate (bpm): 1 Fundal Height: 36 cm Movement: Present  Presentation: Undeterminable  General:  Alert, oriented and cooperative. Patient is in no acute distress.  Skin: Skin is warm and dry.   Cardiovascular: Regular rate and rhythm.  Respiratory: Normal respiratory effort. CTA-Bilaterally  Abdomen: Soft, gravid, appropriate for gestational age.  Pelvic: Cervical exam performed Dilation: Closed Effacement (%): Thick Station: Ballotable  Extremities: Normal range of motion.  Edema: None  Mental Status: Normal mood and affect. Normal behavior. Normal judgment and thought content.   Assessment and Plan:  Pregnancy: G1P0 at [redacted]w[redacted]d  1. Encounter for supervision of normal pregnancy, antepartum, unspecified  gravidity -Discussed benadryl usage for assistance with insomnia as patient will be going back to work Monday. *Educated on proper dosage of 50 mg and to take about 2 hours before regular sleep time to ensure adequate time to rest. -Encouraged to continue pepcid for acid reflux -Anticipatory guidance for upcoming appts including webex sessions. -Labs as below: *Strep Gp B NAA *GC/Chlamydia probe amp ([place])not at [facility] -Educated on GBS infection including what it is, why we test, and how and when we treat if needed. -Discussed releasing of results to mychart. -Discussed and reviewed postpartum planning including contraception, pediatricians, and infant feedings and circumcision desires/costs. *Patient desires to breastfeed and is considering Depo Provera for contraception. -Plan for Webex ROB in one week.   Term labor symptoms and general obstetric precautions including but not limited to vaginal bleeding, contractions, leaking of fluid and fetal movement were reviewed with the patient.  Please refer to After Visit Summary for other counseling recommendations.  Return in about 1 week (around 08/28/2018) for ROB via Webex.  Future Appointments  Date Time Provider Department Center  08/28/2018 11:15 AM Constant, Gigi Gin, MD [facility] None  09/04/2018 10:00 AM Sharyon Cable, CNM [facility] None  09/11/2018  2:30 PM Hermina Staggers, MD [facility] None    Cherre Robins, CNM 08/21/2018, 10:37 AM

## 2018-08-22 LAB — GC/CHLAMYDIA PROBE AMP (~~LOC~~) NOT AT ARMC
Chlamydia: NEGATIVE
Neisseria Gonorrhea: NEGATIVE

## 2018-08-23 ENCOUNTER — Encounter (HOSPITAL_COMMUNITY): Payer: Self-pay

## 2018-08-23 DIAGNOSIS — B951 Streptococcus, group B, as the cause of diseases classified elsewhere: Secondary | ICD-10-CM | POA: Insufficient documentation

## 2018-08-23 LAB — STREP GP B NAA: Strep Gp B NAA: POSITIVE — AB

## 2018-08-28 ENCOUNTER — Ambulatory Visit (INDEPENDENT_AMBULATORY_CARE_PROVIDER_SITE_OTHER): Payer: Medicaid Other | Admitting: Obstetrics

## 2018-08-28 ENCOUNTER — Encounter: Payer: Self-pay | Admitting: Obstetrics

## 2018-08-28 ENCOUNTER — Other Ambulatory Visit: Payer: Self-pay

## 2018-08-28 DIAGNOSIS — Z3490 Encounter for supervision of normal pregnancy, unspecified, unspecified trimester: Secondary | ICD-10-CM

## 2018-08-28 DIAGNOSIS — Z3493 Encounter for supervision of normal pregnancy, unspecified, third trimester: Secondary | ICD-10-CM

## 2018-08-28 DIAGNOSIS — Z3A38 38 weeks gestation of pregnancy: Secondary | ICD-10-CM

## 2018-08-28 NOTE — Progress Notes (Signed)
   TELEHEALTH VIRTUAL OBSTETRICS PRENATAL VISIT ENCOUNTER NOTE  I connected with Cambre Dawson on 08/28/18 at 11:15 AM EDT by WebEx at home and verified that I am speaking with the correct person using two identifiers.   I discussed the limitations, risks, security and privacy concerns of performing an evaluation and management service by telephone and the availability of in person appointments. I also discussed with the patient that there may be a patient responsible charge related to this service. The patient expressed understanding and agreed to proceed. Subjective:  Joan Dawson is a 28 y.o. G1P0 at [redacted]w[redacted]d being seen today for ongoing prenatal care.  She is currently monitored for the following issues for this low-risk pregnancy and has Supervision of normal pregnancy, antepartum; History of migraine; Chlamydia infection affecting pregnancy, antepartum; and Group beta Strep positive on their problem list.  Patient reports no complaints.  Reports fetal movement. Contractions: Not present. Vag. Bleeding: None.  Movement: Present. Denies any contractions, bleeding or leaking of fluid.   The following portions of the patient's history were reviewed and updated as appropriate: allergies, current medications, past family history, past medical history, past social history, past surgical history and problem list.   Objective:   Vitals:   08/28/18 1108  BP: 124/77  Pulse: 84    Fetal Status:     Movement: Present     General:  Alert, oriented and cooperative. Patient is in no acute distress.  Respiratory: Normal respiratory effort, no problems with respiration noted  Mental Status: Normal mood and affect. Normal behavior. Normal judgment and thought content.  Rest of physical exam deferred due to type of encounter  Assessment and Plan:  Pregnancy: G1P0 at [redacted]w[redacted]d 1. Encounter for supervision of normal pregnancy, antepartum, unspecified gravidity   Term labor symptoms and general obstetric  precautions including but not limited to vaginal bleeding, contractions, leaking of fluid and fetal movement were reviewed in detail with the patient. I discussed the assessment and treatment plan with the patient. The patient was provided an opportunity to ask questions and all were answered. The patient agreed with the plan and demonstrated an understanding of the instructions. The patient was advised to call back or seek an in-person office evaluation/go to MAU at [facility] for any urgent or concerning symptoms. Please refer to After Visit Summary for other counseling recommendations.   I provided 10 minutes of face-to-face via WebEx time during this encounter.  Return in about 1 week (around 09/04/2018) for [facility].  Future Appointments  Date Time Provider Department Center  09/04/2018 10:00 AM Sharyon Cable, CNM [facility] None  09/11/2018  2:30 PM Hermina Staggers, MD [facility] None    Coral Ceo, MD Center for [facility], [facility] 08-28-2018

## 2018-08-28 NOTE — Progress Notes (Signed)
Webex ROB: No concerns today per pt

## 2018-09-04 ENCOUNTER — Other Ambulatory Visit: Payer: Self-pay

## 2018-09-04 ENCOUNTER — Ambulatory Visit (INDEPENDENT_AMBULATORY_CARE_PROVIDER_SITE_OTHER): Payer: Medicaid Other | Admitting: Certified Nurse Midwife

## 2018-09-04 ENCOUNTER — Encounter: Payer: Self-pay | Admitting: Certified Nurse Midwife

## 2018-09-04 VITALS — BP 130/78 | HR 86

## 2018-09-04 DIAGNOSIS — Z3490 Encounter for supervision of normal pregnancy, unspecified, unspecified trimester: Secondary | ICD-10-CM

## 2018-09-04 DIAGNOSIS — O98513 Other viral diseases complicating pregnancy, third trimester: Secondary | ICD-10-CM

## 2018-09-04 DIAGNOSIS — Z3A39 39 weeks gestation of pregnancy: Secondary | ICD-10-CM

## 2018-09-04 DIAGNOSIS — B951 Streptococcus, group B, as the cause of diseases classified elsewhere: Secondary | ICD-10-CM

## 2018-09-04 NOTE — Progress Notes (Signed)
Todays B/P 130/78  P: 86

## 2018-09-04 NOTE — Progress Notes (Signed)
   TELEHEALTH VIRTUAL OBSTETRICS PRENATAL VISIT ENCOUNTER NOTE  I connected with Joan Dawson on 09/03/24 at 10:00 AM EDT by WebEx at home and verified that I am speaking with the correct person using two identifiers.   I discussed the limitations, risks, security and privacy concerns of performing an evaluation and management service by telephone and the availability of in person appointments. I also discussed with the patient that there may be a patient responsible charge related to this service. The patient expressed understanding and agreed to proceed. Subjective:  Joan Dawson is a 28 y.o.  G1P0 at [redacted]w[redacted]d being seen today for ongoing prenatal care.  She is currently monitored for the following issues for this low-risk pregnancy and has Supervision of normal pregnancy, antepartum; History of migraine; Chlamydia infection affecting pregnancy, antepartum; and Group beta Strep positive on their problem list.  Patient reports no complaints.  Reports fetal movement. Contractions: Not present. Vag. Bleeding: None.  Movement: Present. Denies any contractions, bleeding or leaking of fluid.   The following portions of the patient's history were reviewed and updated as appropriate: allergies, current medications, past family history, past medical history, past social history, past surgical history and problem list.   Objective:   Vitals:   09/04/18 1002  BP: 130/78  Pulse: 86    Fetal Status:     Movement: Present     General:  Alert, oriented and cooperative. Patient is in no acute distress.  Respiratory: Normal respiratory effort, no problems with respiration noted  Mental Status: Normal mood and affect. Normal behavior. Normal judgment and thought content.  Rest of physical exam deferred due to type of encounter  Assessment and Plan:  Pregnancy: G1P0 at [redacted]w[redacted]d 1. Encounter for supervision of normal pregnancy, antepartum, unspecified gravidity - Patient doing well, no complaints -  Anticipatory guidance on upcoming appointments  - Encouraged patient to continue inputting BP into babyscripts app  - Discussed with patient post dates antenatal screening to occur next week, discussed with patient IOL being week of 5/25, possible outpatient FB insertion on 5/26 with IOL on 5/27, needs to discuss with patient again.  - Orders placed for induction   2. Group beta Strep positive - Treat in labor   Term labor symptoms and general obstetric precautions including but not limited to vaginal bleeding, contractions, leaking of fluid and fetal movement were reviewed in detail with the patient. I discussed the assessment and treatment plan with the patient. The patient was provided an opportunity to ask questions and all were answered. The patient agreed with the plan and demonstrated an understanding of the instructions. The patient was advised to call back or seek an in-person office evaluation/go to MAU at [facility] for any urgent or concerning symptoms. Please refer to After Visit Summary for other counseling recommendations.   I provided 10 minutes of face-to-face via WebEx time during this encounter.  Return in about 1 week (around 09/11/2018) for ROB, NST.  Future Appointments  Date Time Provider Department Center  09/11/2018  2:30 PM Hermina Staggers, MD [facility] None  09/18/2018 12:00 AM [facility] SCHED ROOM [facility] None    Sharyon Cable, CNM Center for [employer], [facility]

## 2018-09-04 NOTE — Progress Notes (Signed)
ROB with concerns.

## 2018-09-04 NOTE — Addendum Note (Signed)
Addended by: Sharyon Cable on: 09/04/2018 04:26 PM   Modules accepted: Orders, SmartSet

## 2018-09-05 ENCOUNTER — Encounter (HOSPITAL_COMMUNITY): Payer: Self-pay | Admitting: *Deleted

## 2018-09-05 ENCOUNTER — Telehealth (HOSPITAL_COMMUNITY): Payer: Self-pay | Admitting: *Deleted

## 2018-09-05 NOTE — Telephone Encounter (Signed)
Preadmission screen  

## 2018-09-11 ENCOUNTER — Encounter: Payer: Self-pay | Admitting: Obstetrics and Gynecology

## 2018-09-11 ENCOUNTER — Ambulatory Visit (INDEPENDENT_AMBULATORY_CARE_PROVIDER_SITE_OTHER): Payer: Medicaid Other | Admitting: Obstetrics and Gynecology

## 2018-09-11 ENCOUNTER — Other Ambulatory Visit: Payer: Self-pay

## 2018-09-11 VITALS — BP 134/85 | HR 108 | Temp 99.3°F | Wt 273.2 lb

## 2018-09-11 DIAGNOSIS — Z3A40 40 weeks gestation of pregnancy: Secondary | ICD-10-CM

## 2018-09-11 DIAGNOSIS — O480 Post-term pregnancy: Secondary | ICD-10-CM

## 2018-09-11 DIAGNOSIS — Z3480 Encounter for supervision of other normal pregnancy, unspecified trimester: Secondary | ICD-10-CM

## 2018-09-11 DIAGNOSIS — B951 Streptococcus, group B, as the cause of diseases classified elsewhere: Secondary | ICD-10-CM

## 2018-09-11 DIAGNOSIS — O99824 Streptococcus B carrier state complicating childbirth: Secondary | ICD-10-CM | POA: Diagnosis not present

## 2018-09-11 NOTE — Progress Notes (Signed)
Pt presents for ROB and NST. EDD scheduled 09/18/2018. Pt has questions about baby's position.

## 2018-09-11 NOTE — Progress Notes (Signed)
Subjective:  Joan Dawson is a 28 y.o. G1P0 at [redacted]w[redacted]d being seen today for ongoing prenatal care.  She is currently monitored for the following issues for this low-risk pregnancy and has Supervision of normal pregnancy, antepartum; History of migraine; Chlamydia infection affecting pregnancy, antepartum; and Group beta Strep positive on their problem list.  Patient reports general discomforts of pregnancy.  Contractions: Not present. Vag. Bleeding: None.  Movement: Present. Denies leaking of fluid.   The following portions of the patient's history were reviewed and updated as appropriate: allergies, current medications, past family history, past medical history, past social history, past surgical history and problem list. Problem list updated.  Objective:   Vitals:   09/11/18 1432  BP: 134/85  Pulse: (!) 108  Temp: 99.3 F (37.4 C)  Weight: 273 lb 3.2 oz (123.9 kg)    Fetal Status: Fetal Heart Rate (bpm): NST    Movement: Present     General:  Alert, oriented and cooperative. Patient is in no acute distress.  Skin: Skin is warm and dry. No rash noted.   Cardiovascular: Normal heart rate noted  Respiratory: Normal respiratory effort, no problems with respiration noted  Abdomen: Soft, gravid, appropriate for gestational age. Pain/Pressure: Absent     Pelvic:  Cervical exam performed        Extremities: Normal range of motion.  Edema: None  Mental Status: Normal mood and affect. Normal behavior. Normal judgment and thought content.   Urinalysis:      Assessment and Plan:  Pregnancy: G1P0 at [redacted]w[redacted]d  1. Supervision of other normal pregnancy, antepartum NST baseline 150's, 15 x 15 accels, no ut ctx reactive Labor precautions IOL 09/18/18 - Fetal nonstress test  2. Group beta Strep positive Tx while in labor  Term labor symptoms and general obstetric precautions including but not limited to vaginal bleeding, contractions, leaking of fluid and fetal movement were reviewed in detail  with the patient. Please refer to After Visit Summary for other counseling recommendations.  No follow-ups on file.   Hermina Staggers, MD

## 2018-09-11 NOTE — Patient Instructions (Signed)
Postpartum Care After Vaginal Delivery °This sheet gives you information about how to care for yourself from the time you deliver your baby to up to 6-12 weeks after delivery (postpartum period). Your health care provider may also give you more specific instructions. If you have problems or questions, contact your health care provider. °Follow these instructions at home: °Vaginal bleeding °· It is normal to have vaginal bleeding (lochia) after delivery. Wear a sanitary pad for vaginal bleeding and discharge. °? During the first week after delivery, the amount and appearance of lochia is often similar to a menstrual period. °? Over the next few weeks, it will gradually decrease to a dry, yellow-brown discharge. °? For most women, lochia stops completely by 4-6 weeks after delivery. Vaginal bleeding can vary from woman to woman. °· Change your sanitary pads frequently. Watch for any changes in your flow, such as: °? A sudden increase in volume. °? A change in color. °? Large blood clots. °· If you pass a blood clot from your vagina, save it and call your health care provider to discuss. Do not flush blood clots down the toilet before talking with your health care provider. °· Do not use tampons or douches until your health care provider says this is safe. °· If you are not breastfeeding, your period should return 6-8 weeks after delivery. If you are feeding your child breast milk only (exclusive breastfeeding), your period may not return until you stop breastfeeding. °Perineal care °· Keep the area between the vagina and the anus (perineum) clean and dry as told by your health care provider. Use medicated pads and pain-relieving sprays and creams as directed. °· If you had a cut in the perineum (episiotomy) or a tear in the vagina, check the area for signs of infection until you are healed. Check for: °? More redness, swelling, or pain. °? Fluid or blood coming from the cut or tear. °? Warmth. °? Pus or a bad  smell. °· You may be given a squirt bottle to use instead of wiping to clean the perineum area after you go to the bathroom. As you start healing, you may use the squirt bottle before wiping yourself. Make sure to wipe gently. °· To relieve pain caused by an episiotomy, a tear in the vagina, or swollen veins in the anus (hemorrhoids), try taking a warm sitz bath 2-3 times a day. A sitz bath is a warm water bath that is taken while you are sitting down. The water should only come up to your hips and should cover your buttocks. °Breast care °· Within the first few days after delivery, your breasts may feel heavy, full, and uncomfortable (breast engorgement). Milk may also leak from your breasts. Your health care provider can suggest ways to help relieve the discomfort. Breast engorgement should go away within a few days. °· If you are breastfeeding: °? Wear a bra that supports your breasts and fits you well. °? Keep your nipples clean and dry. Apply creams and ointments as told by your health care provider. °? You may need to use breast pads to absorb milk that leaks from your breasts. °? You may have uterine contractions every time you breastfeed for up to several weeks after delivery. Uterine contractions help your uterus return to its normal size. °? If you have any problems with breastfeeding, work with your health care provider or lactation consultant. °· If you are not breastfeeding: °? Avoid touching your breasts a lot. Doing this can make   your breasts produce more milk. °? Wear a good-fitting bra and use cold packs to help with swelling. °? Do not squeeze out (express) milk. This causes you to make more milk. °Intimacy and sexuality °· Ask your health care provider when you can engage in sexual activity. This may depend on: °? Your risk of infection. °? How fast you are healing. °? Your comfort and desire to engage in sexual activity. °· You are able to get pregnant after delivery, even if you have not had  your period. If desired, talk with your health care provider about methods of birth control (contraception). °Medicines °· Take over-the-counter and prescription medicines only as told by your health care provider. °· If you were prescribed an antibiotic medicine, take it as told by your health care provider. Do not stop taking the antibiotic even if you start to feel better. °Activity °· Gradually return to your normal activities as told by your health care provider. Ask your health care provider what activities are safe for you. °· Rest as much as possible. Try to rest or take a nap while your baby is sleeping. °Eating and drinking ° °· Drink enough fluid to keep your urine pale yellow. °· Eat high-fiber foods every day. These may help prevent or relieve constipation. High-fiber foods include: °? Whole grain cereals and breads. °? Brown rice. °? Beans. °? Fresh fruits and vegetables. °· Do not try to lose weight quickly by cutting back on calories. °· Take your prenatal vitamins until your postpartum checkup or until your health care provider tells you it is okay to stop. °Lifestyle °· Do not use any products that contain nicotine or tobacco, such as cigarettes and e-cigarettes. If you need help quitting, ask your health care provider. °· Do not drink alcohol, especially if you are breastfeeding. °General instructions °· Keep all follow-up visits for you and your baby as told by your health care provider. Most women visit their health care provider for a postpartum checkup within the first 3-6 weeks after delivery. °Contact a health care provider if: °· You feel unable to cope with the changes that your child brings to your life, and these feelings do not go away. °· You feel unusually sad or worried. °· Your breasts become red, painful, or hard. °· You have a fever. °· You have trouble holding urine or keeping urine from leaking. °· You have little or no interest in activities you used to enjoy. °· You have not  breastfed at all and you have not had a menstrual period for 12 weeks after delivery. °· You have stopped breastfeeding and you have not had a menstrual period for 12 weeks after you stopped breastfeeding. °· You have questions about caring for yourself or your baby. °· You pass a blood clot from your vagina. °Get help right away if: °· You have chest pain. °· You have difficulty breathing. °· You have sudden, severe leg pain. °· You have severe pain or cramping in your lower abdomen. °· You bleed from your vagina so much that you fill more than one sanitary pad in one hour. Bleeding should not be heavier than your heaviest period. °· You develop a severe headache. °· You faint. °· You have blurred vision or spots in your vision. °· You have bad-smelling vaginal discharge. °· You have thoughts about hurting yourself or your baby. °If you ever feel like you may hurt yourself or others, or have thoughts about taking your own life, get help   right away. You can go to the nearest emergency department or call:  Your local emergency services (911 in the [place]  A suicide crisis helpline, such as the National Suicide Prevention Lifeline at (916)504-21111-(205)777-3836. This is open 24 hours a day. Summary  The period of time right after you deliver your newborn up to 6-12 weeks after delivery is called the postpartum period.  Gradually return to your normal activities as told by your health care provider.  Keep all follow-up visits for you and your baby as told by your health care provider. This information is not intended to replace advice given to you by your health care provider. Make sure you discuss any questions you have with your health care provider. Document Released: 02/05/2007 Document Revised: 01/22/2017 Document Reviewed: 01/22/2017 Elsevier Interactive Patient Education  2019 [employer] Vaginal Delivery  Vaginal delivery means that you give birth by pushing your baby out of your birth canal (vagina). A  team of health care providers will help you before, during, and after vaginal delivery. Birth experiences are unique for every woman and every pregnancy, and birth experiences vary depending on where you choose to give birth. What happens when I arrive at the birth center or hospital? Once you are in labor and have been admitted into the hospital or birth center, your health care provider may:  Review your pregnancy history and any concerns that you have.  Insert an IV into one of your veins. This may be used to give you fluids and medicines.  Check your blood pressure, pulse, temperature, and heart rate (vital signs).  Check whether your bag of water (amniotic sac) has broken (ruptured).  Talk with you about your birth plan and discuss pain control options. Monitoring Your health care provider may monitor your contractions (uterine monitoring) and your baby's heart rate (fetal monitoring). You may need to be monitored:  Often, but not continuously (intermittently).  All the time or for long periods at a time (continuously). Continuous monitoring may be needed if: ? You are taking certain medicines, such as medicine to relieve pain or make your contractions stronger. ? You have pregnancy or labor complications. Monitoring may be done by:  Placing a special stethoscope or a handheld monitoring device on your abdomen to check your baby's heartbeat and to check for contractions.  Placing monitors on your abdomen (external monitors) to record your baby's heartbeat and the frequency and length of contractions.  Placing monitors inside your uterus through your vagina (internal monitors) to record your baby's heartbeat and the frequency, length, and strength of your contractions. Depending on the type of monitor, it may remain in your uterus or on your baby's head until birth.  Telemetry. This is a type of continuous monitoring that can be done with external or internal monitors. Instead of  having to stay in bed, you are able to move around during telemetry. Physical exam Your health care provider may perform frequent physical exams. This may include:  Checking how and where your baby is positioned in your uterus.  Checking your cervix to determine: ? Whether it is thinning out (effacing). ? Whether it is opening up (dilating). What happens during labor and delivery?  Normal labor and delivery is divided into the following three stages: Stage 1  This is the longest stage of labor.  This stage can last for hours or days.  Throughout this stage, you will feel contractions. Contractions generally feel mild, infrequent, and irregular at first. They get stronger, more  frequent (about every 2-3 minutes), and more regular as you move through this stage.  This stage ends when your cervix is completely dilated to 4 inches (10 cm) and completely effaced. Stage 2  This stage starts once your cervix is completely effaced and dilated and lasts until the delivery of your baby.  This stage may last from 20 minutes to 2 hours.  This is the stage where you will feel an urge to push your baby out of your vagina.  You may feel stretching and burning pain, especially when the widest part of your baby's head passes through the vaginal opening (crowning).  Once your baby is delivered, the umbilical cord will be clamped and cut. This usually occurs after waiting a period of 1-2 minutes after delivery.  Your baby will be placed on your bare chest (skin-to-skin contact) in an upright position and covered with a warm blanket. Watch your baby for feeding cues, like rooting or sucking, and help the baby to your breast for his or her first feeding. Stage 3  This stage starts immediately after the birth of your baby and ends after you deliver the placenta.  This stage may take anywhere from 5 to 30 minutes.  After your baby has been delivered, you will feel contractions as your body expels  the placenta and your uterus contracts to control bleeding. What can I expect after labor and delivery?  After labor is over, you and your baby will be monitored closely until you are ready to go home to ensure that you are both healthy. Your health care team will teach you how to care for yourself and your baby.  You and your baby will stay in the same room (rooming in) during your hospital stay. This will encourage early bonding and successful breastfeeding.  You may continue to receive fluids and medicines through an IV.  Your uterus will be checked and massaged regularly (fundal massage).  You will have some soreness and pain in your abdomen, vagina, and the area of skin between your vaginal opening and your anus (perineum).  If an incision was made near your vagina (episiotomy) or if you had some vaginal tearing during delivery, cold compresses may be placed on your episiotomy or your tear. This helps to reduce pain and swelling.  You may be given a squirt bottle to use instead of wiping when you go to the bathroom. To use the squirt bottle, follow these steps: ? Before you urinate, fill the squirt bottle with warm water. Do not use hot water. ? After you urinate, while you are sitting on the toilet, use the squirt bottle to rinse the area around your urethra and vaginal opening. This rinses away any urine and blood. ? Fill the squirt bottle with clean water every time you use the bathroom.  It is normal to have vaginal bleeding after delivery. Wear a sanitary pad for vaginal bleeding and discharge. Summary  Vaginal delivery means that you will give birth by pushing your baby out of your birth canal (vagina).  Your health care provider may monitor your contractions (uterine monitoring) and your baby's heart rate (fetal monitoring).  Your health care provider may perform a physical exam.  Normal labor and delivery is divided into three stages.  After labor is over, you and your  baby will be monitored closely until you are ready to go home. This information is not intended to replace advice given to you by your health care provider. Make sure you  discuss any questions you have with your health care provider. Document Released: 01/18/2008 Document Revised: 05/15/2017 Document Reviewed: 05/15/2017 Elsevier Interactive Patient Education  2019 [employer].

## 2018-09-16 ENCOUNTER — Other Ambulatory Visit (HOSPITAL_COMMUNITY)
Admission: RE | Admit: 2018-09-16 | Discharge: 2018-09-16 | Disposition: A | Discharge status: Home / Self Care | Payer: Medicaid Other | Source: Ambulatory Visit | Attending: Obstetrics & Gynecology | Admitting: Obstetrics & Gynecology

## 2018-09-16 ENCOUNTER — Other Ambulatory Visit: Payer: Self-pay

## 2018-09-16 DIAGNOSIS — Z1159 Encounter for screening for other viral diseases: Secondary | ICD-10-CM | POA: Diagnosis present

## 2018-09-16 NOTE — MAU Note (Signed)
Asymptomatic, swab collected without problem. 

## 2018-09-17 ENCOUNTER — Other Ambulatory Visit (HOSPITAL_COMMUNITY): Payer: Self-pay | Admitting: *Deleted

## 2018-09-17 ENCOUNTER — Encounter (HOSPITAL_COMMUNITY): Payer: Self-pay

## 2018-09-17 ENCOUNTER — Inpatient Hospital Stay (HOSPITAL_COMMUNITY)
Admission: AD | Admit: 2018-09-17 | Discharge: 2018-09-20 | DRG: 807 | Disposition: A | Discharge status: Home / Self Care | Payer: Medicaid Other | Attending: Family Medicine | Admitting: Family Medicine

## 2018-09-17 ENCOUNTER — Other Ambulatory Visit: Payer: Self-pay

## 2018-09-17 DIAGNOSIS — O26893 Other specified pregnancy related conditions, third trimester: Secondary | ICD-10-CM | POA: Diagnosis present

## 2018-09-17 DIAGNOSIS — O479 False labor, unspecified: Secondary | ICD-10-CM | POA: Diagnosis present

## 2018-09-17 DIAGNOSIS — Z3A41 41 weeks gestation of pregnancy: Secondary | ICD-10-CM

## 2018-09-17 DIAGNOSIS — O99824 Streptococcus B carrier state complicating childbirth: Secondary | ICD-10-CM | POA: Diagnosis present

## 2018-09-17 DIAGNOSIS — O480 Post-term pregnancy: Secondary | ICD-10-CM | POA: Diagnosis present

## 2018-09-17 DIAGNOSIS — Z87891 Personal history of nicotine dependence: Secondary | ICD-10-CM | POA: Diagnosis not present

## 2018-09-17 DIAGNOSIS — A749 Chlamydial infection, unspecified: Secondary | ICD-10-CM

## 2018-09-17 LAB — NOVEL CORONAVIRUS, NAA (HOSP ORDER, SEND-OUT TO REF LAB; TAT 18-24 HRS): SARS-CoV-2, NAA: NOT DETECTED

## 2018-09-17 LAB — ABO/RH: ABO/RH(D): B POS

## 2018-09-17 LAB — CBC
HCT: 34.6 % — ABNORMAL LOW (ref 36.0–46.0)
Hemoglobin: 11.6 g/dL — ABNORMAL LOW (ref 12.0–15.0)
MCH: 29.7 pg (ref 26.0–34.0)
MCHC: 33.5 g/dL (ref 30.0–36.0)
MCV: 88.5 fL (ref 80.0–100.0)
Platelets: 209 10*3/uL (ref 150–400)
RBC: 3.91 MIL/uL (ref 3.87–5.11)
RDW: 13.2 % (ref 11.5–15.5)
WBC: 12.4 10*3/uL — ABNORMAL HIGH (ref 4.0–10.5)
nRBC: 0 % (ref 0.0–0.2)

## 2018-09-17 LAB — TYPE AND SCREEN
ABO/RH(D): B POS
Antibody Screen: NEGATIVE

## 2018-09-17 MED ORDER — OXYCODONE-ACETAMINOPHEN 5-325 MG PO TABS: 2.0000 | ORAL_TABLET | ORAL | Status: DC | PRN

## 2018-09-17 MED ORDER — OXYCODONE-ACETAMINOPHEN 5-325 MG PO TABS: 1.0000 | ORAL_TABLET | ORAL | Status: DC | PRN

## 2018-09-17 MED ORDER — PENICILLIN G 3 MILLION UNITS IVPB - SIMPLE MED: 3.0000 10*6.[IU] | INTRAVENOUS | Status: DC

## 2018-09-17 MED ORDER — SOD CITRATE-CITRIC ACID 500-334 MG/5ML PO SOLN: 30.0000 mL | ORAL | Status: DC | PRN

## 2018-09-17 MED ORDER — TERBUTALINE SULFATE 1 MG/ML IJ SOLN: 0.2500 mg | Freq: Once | INTRAMUSCULAR | Status: DC | PRN

## 2018-09-17 MED ORDER — FENTANYL CITRATE (PF) 100 MCG/2ML IJ SOLN
100.0000 ug | INTRAMUSCULAR | Status: DC | PRN
  Administered 2018-09-17: 23:00:00 100 ug via INTRAVENOUS
  Filled 2018-09-17: qty 2

## 2018-09-17 MED ORDER — LACTATED RINGERS IV SOLN: 500.0000 mL | INTRAVENOUS | Status: DC | PRN

## 2018-09-17 MED ORDER — LIDOCAINE HCL (PF) 1 % IJ SOLN
30.0000 mL | INTRAMUSCULAR | Status: AC | PRN
  Administered 2018-09-18: 01:00:00 30 mL via SUBCUTANEOUS

## 2018-09-17 MED ORDER — OXYTOCIN 40 UNITS IN NORMAL SALINE INFUSION - SIMPLE MED: 2.5000 [IU]/h | INTRAVENOUS | Status: DC

## 2018-09-17 MED ORDER — OXYTOCIN 40 UNITS IN NORMAL SALINE INFUSION - SIMPLE MED
2.5000 [IU]/h | INTRAVENOUS | Status: DC
  Filled 2018-09-17: qty 1000

## 2018-09-17 MED ORDER — ONDANSETRON HCL 4 MG/2ML IJ SOLN: 4.0000 mg | Freq: Four times a day (QID) | INTRAMUSCULAR | Status: DC | PRN

## 2018-09-17 MED ORDER — OXYTOCIN BOLUS FROM INFUSION
500.0000 mL | Freq: Once | INTRAVENOUS | Status: AC
  Administered 2018-09-18: 01:00:00 500 mL via INTRAVENOUS

## 2018-09-17 MED ORDER — FENTANYL CITRATE (PF) 100 MCG/2ML IJ SOLN
INTRAMUSCULAR | Status: AC
  Filled 2018-09-17: qty 2

## 2018-09-17 MED ORDER — LACTATED RINGERS IV SOLN
INTRAVENOUS | Status: DC
  Administered 2018-09-17: 20:00:00 via INTRAVENOUS

## 2018-09-17 MED ORDER — ACETAMINOPHEN 325 MG PO TABS: 650.0000 mg | ORAL_TABLET | ORAL | Status: DC | PRN

## 2018-09-17 MED ORDER — SODIUM CHLORIDE 0.9 % IV SOLN
5.0000 10*6.[IU] | Freq: Once | INTRAVENOUS | Status: AC
  Administered 2018-09-17: 21:00:00 5 10*6.[IU] via INTRAVENOUS
  Filled 2018-09-17: qty 5

## 2018-09-17 MED ORDER — OXYTOCIN BOLUS FROM INFUSION: 500.0000 mL | Freq: Once | INTRAVENOUS | Status: DC

## 2018-09-17 MED ORDER — LIDOCAINE HCL (PF) 1 % IJ SOLN: 30.0000 mL | INTRAMUSCULAR | Status: DC | PRN

## 2018-09-17 MED ORDER — LACTATED RINGERS IV SOLN: INTRAVENOUS | Status: DC

## 2018-09-17 MED ORDER — SODIUM CHLORIDE 0.9 % IV SOLN: 5.0000 10*6.[IU] | Freq: Once | INTRAVENOUS | Status: DC

## 2018-09-17 MED ORDER — PENICILLIN G 3 MILLION UNITS IVPB - SIMPLE MED
3.0000 10*6.[IU] | INTRAVENOUS | Status: DC
  Filled 2018-09-17: qty 100

## 2018-09-17 NOTE — MAU Note (Signed)
Supposed to come in at midnight for induction.  Is pretty sure she passed her mucous plug this afternoon and is having contractions, 3-5 min.

## 2018-09-18 ENCOUNTER — Encounter (HOSPITAL_COMMUNITY): Payer: Self-pay

## 2018-09-18 ENCOUNTER — Inpatient Hospital Stay (HOSPITAL_COMMUNITY): Payer: Medicaid Other

## 2018-09-18 DIAGNOSIS — O480 Post-term pregnancy: Secondary | ICD-10-CM

## 2018-09-18 DIAGNOSIS — O99824 Streptococcus B carrier state complicating childbirth: Secondary | ICD-10-CM

## 2018-09-18 DIAGNOSIS — Z3A41 41 weeks gestation of pregnancy: Secondary | ICD-10-CM

## 2018-09-18 LAB — HIV ANTIBODY (ROUTINE TESTING W REFLEX): HIV Screen 4th Generation wRfx: NONREACTIVE

## 2018-09-18 LAB — RPR: RPR Ser Ql: NONREACTIVE

## 2018-09-18 MED ORDER — LIDOCAINE HCL (PF) 1 % IJ SOLN
INTRAMUSCULAR | Status: AC
  Administered 2018-09-18: 01:00:00 30 mL via SUBCUTANEOUS
  Filled 2018-09-18: qty 30

## 2018-09-18 MED ORDER — IBUPROFEN 600 MG PO TABS
600.0000 mg | ORAL_TABLET | Freq: Four times a day (QID) | ORAL | Status: DC
  Administered 2018-09-18 – 2018-09-20 (×9): 600 mg via ORAL
  Filled 2018-09-18 (×9): qty 1

## 2018-09-18 MED ORDER — DIPHENHYDRAMINE HCL 25 MG PO CAPS: 25.0000 mg | ORAL_CAPSULE | Freq: Four times a day (QID) | ORAL | Status: DC | PRN

## 2018-09-18 MED ORDER — BENZOCAINE-MENTHOL 20-0.5 % EX AERO
1.0000 "application " | INHALATION_SPRAY | CUTANEOUS | Status: DC | PRN
  Administered 2018-09-18: 1 via TOPICAL
  Filled 2018-09-18: qty 56

## 2018-09-18 MED ORDER — SENNOSIDES-DOCUSATE SODIUM 8.6-50 MG PO TABS
2.0000 | ORAL_TABLET | ORAL | Status: DC
  Administered 2018-09-18 – 2018-09-20 (×2): 2 via ORAL
  Filled 2018-09-18 (×2): qty 2

## 2018-09-18 MED ORDER — ONDANSETRON HCL 4 MG PO TABS
4.0000 mg | ORAL_TABLET | ORAL | Status: DC | PRN
  Administered 2018-09-20: 11:00:00 4 mg via ORAL
  Filled 2018-09-18: qty 1

## 2018-09-18 MED ORDER — WITCH HAZEL-GLYCERIN EX PADS: 1.0000 "application " | MEDICATED_PAD | CUTANEOUS | Status: DC | PRN

## 2018-09-18 MED ORDER — ZOLPIDEM TARTRATE 5 MG PO TABS: 5.0000 mg | ORAL_TABLET | Freq: Every evening | ORAL | Status: DC | PRN

## 2018-09-18 MED ORDER — DIBUCAINE (PERIANAL) 1 % EX OINT: 1.0000 "application " | TOPICAL_OINTMENT | CUTANEOUS | Status: DC | PRN

## 2018-09-18 MED ORDER — ACETAMINOPHEN 325 MG PO TABS: 650.0000 mg | ORAL_TABLET | ORAL | Status: DC | PRN

## 2018-09-18 MED ORDER — PRENATAL MULTIVITAMIN CH
1.0000 | ORAL_TABLET | Freq: Every day | ORAL | Status: DC
  Administered 2018-09-18 – 2018-09-19 (×2): 1 via ORAL
  Filled 2018-09-18 (×2): qty 1

## 2018-09-18 MED ORDER — TETANUS-DIPHTH-ACELL PERTUSSIS 5-2.5-18.5 LF-MCG/0.5 IM SUSP: 0.5000 mL | Freq: Once | INTRAMUSCULAR | Status: DC

## 2018-09-18 MED ORDER — ONDANSETRON HCL 4 MG/2ML IJ SOLN: 4.0000 mg | INTRAMUSCULAR | Status: DC | PRN

## 2018-09-18 MED ORDER — SIMETHICONE 80 MG PO CHEW: 80.0000 mg | CHEWABLE_TABLET | ORAL | Status: DC | PRN

## 2018-09-18 MED ORDER — COCONUT OIL OIL
1.0000 "application " | TOPICAL_OIL | Status: DC | PRN
  Administered 2018-09-18: 1 via TOPICAL

## 2018-09-18 NOTE — H&P (Signed)
Joan Dawson is a 28 y.o. female presenting for painful contractions starting about 6pm [facility]  . OB History    Gravida  1   Para      Term      Preterm      AB      Living        SAB      TAB      Ectopic      Multiple      Live Births             Past Medical History:  Diagnosis Date  . ADHD   . Migraines   . Scoliosis    Past Surgical History:  Procedure Laterality Date  . NO PAST SURGERIES     Family History: family history includes Crohn's disease in her sister; Diabetes in her mother; Hypertension in her brother, mother, and sister; Uterine cancer in her mother. Social History:  reports that she quit smoking about 6 months ago. Her smoking use included cigars. She has never used smokeless tobacco. She reports current alcohol use. She reports that she does not use drugs.     Maternal Diabetes: No Genetic Screening: Normal Maternal Ultrasounds/Referrals: Normal Fetal Ultrasounds or other Referrals:  None Maternal Substance Abuse:  No Significant Maternal Medications:  None Significant Maternal Lab Results:  Lab values include: Group B Strep positive Other Comments:  None  Review of Systems  Constitutional: Negative for chills and fever.  Eyes: Negative for blurred vision.  Respiratory: Negative for shortness of breath.   Cardiovascular: Negative for leg swelling.  Gastrointestinal: Positive for abdominal pain. Negative for constipation, diarrhea, nausea and vomiting.  Genitourinary: Negative for dysuria.   Maternal Medical History:  Reason for admission: Contractions.  Nausea.  Contractions: Onset was 3-5 hours ago.   Frequency: irregular.   Perceived severity is moderate.    Fetal activity: Perceived fetal activity is normal.   Last perceived fetal movement was within the past hour.    Prenatal complications: No PIH, pre-eclampsia or preterm labor.   Prenatal Complications - Diabetes:  none.    Dilation: 10 Effacement (%): 100 Station: -2 Exam by:: M. Jhair Witherington,CNM Blood pressure 128/72, pulse 77, temperature 98.4 F (36.9 C), temperature source Oral, resp. rate 18, height 5\' 9"  (1.753 m), weight 122.2 kg, last menstrual period 12/03/2017, SpO2 99 %. Maternal Exam:  Uterine Assessment: Contraction strength is moderate.  Contraction frequency is irregular.   Abdomen: Patient reports no abdominal tenderness. Fetal presentation: vertex  Introitus: Normal vulva. Vagina is positive for vaginal discharge.  Ferning test: not done.  Nitrazine test: not done. Amniotic fluid character: not assessed.  Pelvis: adequate for delivery.   Cervix: Cervix evaluated by digital exam.     Fetal Exam Fetal Monitor Review: Mode: ultrasound.   Baseline rate: 130.  Variability: moderate (6-25 bpm).   Pattern: no decelerations and accelerations present.    Fetal State Assessment: Category I - tracings are normal.     Physical Exam  Constitutional: She is oriented to person, place, and time. She appears well-developed and well-nourished. No distress.  Neck: Normal range of motion. Neck supple.  Cardiovascular: Normal rate.  Respiratory: Effort normal. No respiratory distress.  GI: Soft. She exhibits no distension. There is no abdominal tenderness. There is no rebound and no guarding.  Genitourinary:    Vulva normal.     Vaginal discharge present.   Musculoskeletal: Normal range of motion.  Neurological: She is alert and oriented to person, place, and time.  Skin: Skin is warm and dry.  Psychiatric: She has a normal mood and affect.    Prenatal labs: ABO, Rh: --/--/B POS, B POS Performed at [facility], [address], [place], [place] [address]  [phone]/26 2015) Antibody: NEG (05/26 2015) Rubella: 3.79 (10/28 0936) RPR: Non Reactive (03/03 0906)  HBsAg: Negative (10/28 0936)  HIV: Non Reactive (03/03 0906)  GBS: Positive (04/29 1036)   Assessment/Plan: Single  intrauterine pregnancy at [redacted]w[redacted]d Latent phase labor GBS positive  Admit to Labor and Delivery Routine orders    Wynelle Bourgeois 09/18/2018, 2:17 AM

## 2018-09-18 NOTE — Discharge Summary (Signed)
Postpartum Discharge Summary     Patient Name: Anneli Swaziland DOB: 1990-09-04 MRN: [id number]  Date of admission: 09/17/2018 Delivering Provider: Aviva Signs   Date of discharge: 09/20/2018  Admitting diagnosis: CTX Mucus Plug Intrauterine pregnancy: [redacted]w[redacted]d     Secondary diagnosis:  Active Problems:   Post-dates pregnancy   Uterine contractions during pregnancy   Vaginal delivery  Additional problems: mild shoulder dystocia     Discharge diagnosis: Term Pregnancy Delivered                                                                                                Post partum procedures:none  Augmentation: AROM in second stage  Complications: None  Hospital course:  Onset of Labor With Vaginal Delivery     28 y.o. yo G1P0 at [redacted]w[redacted]d was admitted in Latent Labor on 09/17/2018. Patient had an uncomplicated labor course as follows:  Membrane Rupture Time/Date: 12:25 AM ,09/18/2018   Intrapartum Procedures: Episiotomy: None [1]                                         Lacerations:  1st degree [2];Labial [10];Perineal [11]  Patient had a delivery of a Viable infant. 09/18/2018  Information for the patient's newborn:  [place], Boy Timea [[id number]]  Delivery Method: Vag-Spont    Pateint had an uncomplicated postpartum course.  She is ambulating, tolerating a regular diet, passing flatus, and urinating well. Patient is discharged home in stable condition on 09/20/18.   Magnesium Sulfate recieved: No BMZ received: No  Physical exam  Vitals:   09/18/18 1550 09/19/18 0554 09/19/18 2235 09/20/18 0601  BP: 119/61 105/62 111/71 (!) 93/52  Pulse: 80 65 75 67  Resp: 18 16 18 18   Temp: 98.2 F (36.8 C) 98.9 F (37.2 C) 98.1 F (36.7 C) 97.8 F (36.6 C)  TempSrc:  Oral Oral Oral  SpO2:  98%    Weight:      Height:       General: alert, cooperative and no distress Lochia: appropriate Uterine Fundus: firm Incision: N/A DVT Evaluation: No evidence of DVT seen on  physical exam. Negative Homan's sign. No cords or calf tenderness. No significant calf/ankle edema. Labs: Lab Results  Component Value Date   WBC 12.4 (H) 09/17/2018   HGB 11.6 (L) 09/17/2018   HCT 34.6 (L) 09/17/2018   MCV 88.5 09/17/2018   PLT 209 09/17/2018   CMP Latest Ref Rng & Units 05/13/2018  Glucose 65 - 99 mg/dL 86  BUN 6 - 20 mg/dL 8  Creatinine 1.70 - 0.17 mg/dL 4.94(W)  Sodium 967 - 591 mmol/L 137  Potassium 3.5 - 5.2 mmol/L 4.4  Chloride 96 - 106 mmol/L 104  CO2 20 - 29 mmol/L 21  Calcium 8.7 - 10.2 mg/dL 9.4  Total Protein 6.0 - 8.5 g/dL 6.7  Total Bilirubin 0.0 - 1.2 mg/dL 0.3  Alkaline Phos 39 - 117 IU/L 51  AST 0 - 40 IU/L 11  ALT 0 - 32 IU/L 11  Discharge instruction: per After Visit Summary and "Baby and Me Booklet".  After visit meds:  Allergies as of 09/20/2018   No Known Allergies     Medication List    TAKE these medications   ibuprofen 600 MG tablet Commonly known as:  ADVIL Take 1 tablet (600 mg total) by mouth every 6 (six) hours.       Diet: routine diet  Activity: Advance as tolerated. Pelvic rest for 6 weeks.   Outpatient follow up:4 weeks Follow up Appt: Future Appointments  Date Time Provider Department Center  10/17/2018  3:00 PM Sharyon Cableogers, Veronica C, CNM [facility] None   Follow up Visit: Follow-up Information    [facility]. Schedule an appointment as soon as possible for a visit in 4 week(s).   Specialty:  Obstetrics and Gynecology Contact information: [address], [address] [place] [id number] [phone]          [facility] Please schedule this patient for Postpartum visit in: 4 weeks with the following provider: Any provider For C/S patients schedule nurse incision check in weeks 2 weeks: no Low risk pregnancy complicated by: GBS positive, Mild shoulder dystocia Delivery mode:  SVD Anticipated Birth Control:  Depo PP Procedures needed: none  Schedule Integrated  BH visit: no      Newborn Data: Live born female  Birth Weight:   APGAR: 8, 9  Newborn Delivery   Time head delivered:  09/18/2018 01:05:00 Birth date/time:  09/18/2018 01:05:00 Delivery type:  Vaginal, Spontaneous     Baby Feeding: Breast Disposition:home with mother   09/20/2018 Levie HeritageJacob J Trenell Concannon, DO

## 2018-09-18 NOTE — Lactation Note (Signed)
This note was copied from a baby's chart. Lactation Consultation Note  Patient Name: Joan Dawson Today's Date: 09/18/2018 Reason for consult: Initial assessment;1st time breastfeeding;Term P1, 4 hour female infant, LGA greater than 9 lbs, has mild shoulder dystocia. Per mom, she was told infant had stool inside of her. Infant has been latching well, he latched in L&D and mom was assisted by Nurse and infant breastfeed for 30 minutes at 4:24 am prior to [facility] entering the room. LC did not observe a latch at this time. LC discussed hand expression and mom taught back expressing 3 ml which she will offer at next feeding after latching infant to breast. LC noticed mom is short shafted LC discussed breast stimulation and a little hand expression prior to latching infant to breast. Mom knows to wear breast shells in bra and not sleep in them at night. Mom knows she can pre-pump to help extend nipple shaft out more prior to latching infant to breast.  Mom knows to breastfeed according hunger cues, 8 to 12 times within 24 hours including nights and breastfeed on demand. Mom knows to call Nurse or LC if she has any questions, concerns or need assistance with latching infant to breast. LC discussed I & O. Reviewed Baby & Me book's Breastfeeding Basics.  Mom made aware of O/P services, breastfeeding support groups, community resources, and our phone # for post-discharge questions.   Maternal Data Formula Feeding for Exclusion: No Has patient been taught Hand Expression?: Yes(Mom has 62ml of colostrum for future feeding .) Does the patient have breastfeeding experience prior to this delivery?: No  Feeding Feeding Type: Breast Fed  LATCH Score Latch: Repeated attempts needed to sustain latch, nipple held in mouth throughout feeding, stimulation needed to elicit sucking reflex.  Audible Swallowing: Spontaneous and intermittent  Type of Nipple: Everted at rest and after stimulation  Comfort  (Breast/Nipple): Soft / non-tender  Hold (Positioning): No assistance needed to correctly position infant at breast.  LATCH Score: 9  Interventions Interventions: Breast feeding basics reviewed;Skin to skin;Hand express;Breast compression;Expressed milk;Hand pump;Position options;Shells  Lactation Tools Discussed/Used Tools: Shells;Pump Shell Type: Other (comment)(short shafted.) Breast pump type: Manual WIC Program: Yes Pump Review: Setup, frequency, and cleaning;Milk Storage Initiated by:: Danelle Earthly, IBCLC Date initiated:: 09/18/18   Consult Status Consult Status: Follow-up Date: 09/18/18 Follow-up type: In-patient    Danelle Earthly 09/18/2018, 5:09 AM

## 2018-09-19 MED ORDER — MEDROXYPROGESTERONE ACETATE 150 MG/ML IM SUSP: 150.0000 mg | Freq: Once | INTRAMUSCULAR | Status: DC

## 2018-09-19 NOTE — Lactation Note (Signed)
This note was copied from a baby's chart. Lactation Consultation Note  Patient Name: Boy Ellanore [place] IFOYD'X Date: 09/19/2018 Reason for consult: Follow-up assessment;Primapara;1st time breastfeeding;Term;Hyperbilirubinemia  Visited with Mom of term baby at 44 hrs old.  Baby's bilirubin elevated and baby acting sleepy.  Mom holding baby STS as he is sleeping.  Offered to assist with positioning and trying to latch baby.   Reviewed breast massage and hand expression.  Colostrum drops easily expressed into baby's mouth.  Baby would not root, open his mouth or show any interest.    Set up DEBP and assisted Mom with first pumping.  Encouraged her to hand express at end to collect more colostrum for baby.  RN aware of need to assist with feeding baby by spoon, curved tip syringe or cup.    Mom had let baby suck on pacifier earlier.  Restated importance of no pacifier, keeping baby STS, and offering breast if baby cues to eat.   Plan- 1- Keep baby STS as much as possible 2- Offer breast when baby cues to eat. 3- Pump both breasts/hand express and feed baby any EBM collected. 4- If baby won't latch to breast again, consider formula supplement if unable to express 10-20 ml of colostrum.  Mom does not have a DEBP at home, does have WIC. Referral faxed to [facility]   Feeding Feeding Type: Breast Fed  LATCH Score Latch: Too sleepy or reluctant, no latch achieved, no sucking elicited.  Audible Swallowing: None  Type of Nipple: Everted at rest and after stimulation  Comfort (Breast/Nipple): Soft / non-tender  Hold (Positioning): Full assist, staff holds infant at breast  LATCH Score: 4  Interventions Interventions: Breast feeding basics reviewed;Assisted with latch;Skin to skin;Breast massage;Hand express;Breast compression;Adjust position;Support pillows;Position options;Expressed milk;Coconut oil;Shells;Hand pump;DEBP  Lactation Tools Discussed/Used Tools: Shells;Pump Shell Type:  Inverted Breast pump type: Double-Electric Breast Pump Pump Review: Setup, frequency, and cleaning;Milk Storage Initiated by:: Erby Pian RN IBCLC Date initiated:: 09/19/18   Consult Status Consult Status: Follow-up Date: 09/20/18 Follow-up type: In-patient    Judee Clara 09/19/2018, 5:45 PM

## 2018-09-19 NOTE — Progress Notes (Signed)
Post Partum Day 1 1/2 Subjective: no complaints, up ad lib, voiding and tolerating PO. She wants to stay until tomorrow since the baby can't go until then.  Objective: Blood pressure 105/62, pulse 65, temperature 98.9 F (37.2 C), temperature source Oral, resp. rate 16, height 5\' 9"  (1.753 m), weight 122.2 kg, last menstrual period 12/03/2017, SpO2 98 %, unknown if currently breastfeeding.  Physical Exam:  General: alert Lochia: appropriate Uterine Fundus: firm and non-tender at U-1 DVT Evaluation: No evidence of DVT seen on physical exam.  Recent Labs    09/17/18 2122  HGB 11.6*  HCT 34.6*    Assessment/Plan: Plan for discharge tomorrow  Depo provera today   LOS: 2 days   Allie Bossier 09/19/2018, 9:37 AM

## 2018-09-20 MED ORDER — IBUPROFEN 600 MG PO TABS: 600.0000 mg | ORAL_TABLET | Freq: Four times a day (QID) | ORAL | 0 refills | Status: DC

## 2018-09-20 NOTE — Lactation Note (Signed)
This note was copied from a baby's chart. Lactation Consultation Note  Patient Name: Joan Dawson [id number] Date: 09/20/2018 Reason for consult: Follow-up assessment;Infant weight loss;Primapara;1st time breastfeeding;Other (Comment)(7 % weight loss, post dates )  Randel Books is 77 hours old and has an order for D/C today.  Per mm breast feeding is going much better and the baby is feeding better.  Per mom the baby recently fed at 0815 for 15 mins with swallows. Baby asleep.  Mom denied any issues today. Sore nipple and engorgement prevention and tx reviewed.  Mom has hand pump and a DEBP kit if needed for a DEBP from [facility] .  LC stressed the importance of STS feedings  Until the baby is back to birth weight , gaining steadily,  And can stay awake for majority of feeding. Storage of breast milk, and explanation of engorgement in baby and me booklet.  Mom aware of the breast feeding resources at [facility] health after D/C.   Maternal Data    Feeding Feeding Type: (per mom baby last fed at 8:15am for 15 mins )  LATCH Score                   Interventions Interventions: Breast feeding basics reviewed  Lactation Tools Discussed/Used Tools: Shells;Pump Shell Type: Inverted Breast pump type: Double-Electric Breast Pump Pump Review: Milk Storage   Consult Status Consult Status: Complete Date: 09/20/18    Myer Haff 09/20/2018, 9:24 AM

## 2018-09-20 NOTE — Discharge Instructions (Signed)
Vaginal Delivery, Care After °Refer to this sheet in the next few weeks. These instructions provide you with information about caring for yourself after vaginal delivery. Your health care provider may also give you more specific instructions. Your treatment has been planned according to current medical practices, but problems sometimes occur. Call your health care provider if you have any problems or questions. °What can I expect after the procedure? °After vaginal delivery, it is common to have: °· Some bleeding from your vagina. °· Soreness in your abdomen, your vagina, and the area of skin between your vaginal opening and your anus (perineum). °· Pelvic cramps. °· Fatigue. °Follow these instructions at home: °Medicines °· Take over-the-counter and prescription medicines only as told by your health care provider. °· If you were prescribed an antibiotic medicine, take it as told by your health care provider. Do not stop taking the antibiotic until it is finished. °Driving ° °· Do not drive or operate heavy machinery while taking prescription pain medicine. °· Do not drive for 24 hours if you received a sedative. °Lifestyle °· Do not drink alcohol. This is especially important if you are breastfeeding or taking medicine to relieve pain. °· Do not use tobacco products, including cigarettes, chewing tobacco, or e-cigarettes. If you need help quitting, ask your health care provider. °Eating and drinking °· Drink at least 8 eight-ounce glasses of water every day unless you are told not to by your health care provider. If you choose to breastfeed your baby, you may need to drink more water than this. °· Eat high-fiber foods every day. These foods may help prevent or relieve constipation. High-fiber foods include: °? Whole grain cereals and breads. °? Brown rice. °? Beans. °? Fresh fruits and vegetables. °Activity °· Return to your normal activities as told by your health care provider. Ask your health care provider what  activities are safe for you. °· Rest as much as possible. Try to rest or take a nap when your baby is sleeping. °· Do not lift anything that is heavier than your baby or 10 lb (4.5 kg) until your health care provider says that it is safe. °· Talk with your health care provider about when you can engage in sexual activity. This may depend on your: °? Risk of infection. °? Rate of healing. °? Comfort and desire to engage in sexual activity. °Vaginal Care °· If you have an episiotomy or a vaginal tear, check the area every day for signs of infection. Check for: °? More redness, swelling, or pain. °? More fluid or blood. °? Warmth. °? Pus or a bad smell. °· Do not use tampons or douches until your health care provider says this is safe. °· Watch for any blood clots that may pass from your vagina. These may look like clumps of dark red, brown, or black discharge. °General instructions °· Keep your perineum clean and dry as told by your health care provider. °· Wear loose, comfortable clothing. °· Wipe from front to back when you use the toilet. °· Ask your health care provider if you can shower or take a bath. If you had an episiotomy or a perineal tear during labor and delivery, your health care provider may tell you not to take baths for a certain length of time. °· Wear a bra that supports your breasts and fits you well. °· If possible, have someone help you with household activities and help care for your baby for at least a few days after you   leave the hospital. °· Keep all follow-up visits for you and your baby as told by your health care provider. This is important. °Contact a health care provider if: °· You have: °? Vaginal discharge that has a bad smell. °? Difficulty urinating. °? Pain when urinating. °? A sudden increase or decrease in the frequency of your bowel movements. °? More redness, swelling, or pain around your episiotomy or vaginal tear. °? More fluid or blood coming from your episiotomy or vaginal  tear. °? Pus or a bad smell coming from your episiotomy or vaginal tear. °? A fever. °? A rash. °? Little or no interest in activities you used to enjoy. °? Questions about caring for yourself or your baby. °· Your episiotomy or vaginal tear feels warm to the touch. °· Your episiotomy or vaginal tear is separating or does not appear to be healing. °· Your breasts are painful, hard, or turn red. °· You feel unusually sad or worried. °· You feel nauseous or you vomit. °· You pass large blood clots from your vagina. If you pass a blood clot from your vagina, save it to show to your health care provider. Do not flush blood clots down the toilet without having your health care provider look at them. °· You urinate more than usual. °· You are dizzy or light-headed. °· You have not breastfed at all and you have not had a menstrual period for 12 weeks after delivery. °· You have stopped breastfeeding and you have not had a menstrual period for 12 weeks after you stopped breastfeeding. °Get help right away if: °· You have: °? Pain that does not go away or does not get better with medicine. °? Chest pain. °? Difficulty breathing. °? Blurred vision or spots in your vision. °? Thoughts about hurting yourself or your baby. °· You develop pain in your abdomen or in one of your legs. °· You develop a severe headache. °· You faint. °· You bleed from your vagina so much that you fill two sanitary pads in one hour. °This information is not intended to replace advice given to you by your health care provider. Make sure you discuss any questions you have with your health care provider. °Document Released: 04/07/2000 Document Revised: 09/22/2015 Document Reviewed: 04/25/2015 °Elsevier Interactive Patient Education © 2019 Elsevier Inc. ° °

## 2018-09-20 NOTE — Progress Notes (Signed)
Patient does not want Depo today. She reports feeling nauseated from "taking so many medications" and does not want any other medications at this time. Patient declined Ibuprofen, Prenatal Vitamin and Depo. Eager to go home.

## 2018-09-20 NOTE — Lactation Note (Signed)
This note was copied from a baby's chart. Lactation Consultation Note Baby 48 hrs old. Mom stated baby just finished BF. Mom holding baby STS. Mom stated baby is doing much better w/BF. She thinks he's starting to cluster feed now. Mom stated she is using DEBP and hand expressing giving baby 2 ml colostrum.  Baby having good output for hours of age. Encouraged breast massage at intervals during feeding. Discussed options of supplementing w/formula if output decreased or jaundice increases. Encouraged to call for assistance.  Patient Name: Joan Dawson [id number] Date: 09/20/2018 Reason for consult: Follow-up assessment   Maternal Data    Feeding Feeding Type: Breast Fed  LATCH Score                   Interventions    Lactation Tools Discussed/Used     Consult Status Consult Status: Follow-up Date: 09/20/18 Follow-up type: In-patient    Laylonie Marzec, Diamond Nickel 09/20/2018, 1:12 AM

## 2018-10-17 ENCOUNTER — Ambulatory Visit: Payer: Medicaid Other | Admitting: Certified Nurse Midwife

## 2018-10-29 ENCOUNTER — Encounter: Payer: Self-pay | Admitting: Obstetrics and Gynecology

## 2018-10-29 ENCOUNTER — Other Ambulatory Visit: Payer: Self-pay

## 2018-10-29 ENCOUNTER — Ambulatory Visit (INDEPENDENT_AMBULATORY_CARE_PROVIDER_SITE_OTHER): Payer: Medicaid Other | Admitting: Obstetrics and Gynecology

## 2018-10-29 DIAGNOSIS — Z3202 Encounter for pregnancy test, result negative: Secondary | ICD-10-CM | POA: Diagnosis not present

## 2018-10-29 DIAGNOSIS — Z1389 Encounter for screening for other disorder: Secondary | ICD-10-CM

## 2018-10-29 DIAGNOSIS — Z30013 Encounter for initial prescription of injectable contraceptive: Secondary | ICD-10-CM | POA: Insufficient documentation

## 2018-10-29 LAB — POCT URINE PREGNANCY: Preg Test, Ur: NEGATIVE

## 2018-10-29 MED ORDER — MEDROXYPROGESTERONE ACETATE 150 MG/ML IM SUSP
150.0000 mg | INTRAMUSCULAR | Status: DC
  Administered 2018-10-29 – 2019-04-21 (×2): 150 mg via INTRAMUSCULAR

## 2018-10-29 NOTE — Progress Notes (Signed)
Postpartum Visit  Joan Dawson is a 28 y.o. [id number] female who presents for a postpartum visit. She is 6 weeks postpartum following a spontaneous vaginal delivery. I have fully reviewed the prenatal and intrapartum course. The delivery was at 41.2 gestational weeks.  Anesthesia: IV sedation. Postpartum course has been good; uncomplicated. Baby's course has been good; uncomplicated. Baby is feeding by breast. Bleeding staining only. Bowel function is normal. Bladder function is normal. Patient is not sexually active. Contraception method is none, but she desires Depo starting today. Postpartum depression screening: neg  The following portions of the patient's history were reviewed and updated as appropriate: allergies, current medications, past family history, past medical history, past social history, past surgical history and problem list. Last pap smear done 02-18-18 and was Normal  Review of Systems Constitutional: negative Eyes: negative Ears, nose, mouth, throat, and face: negative Respiratory: negative Cardiovascular: negative Gastrointestinal: negative Genitourinary:negative Integument/breast: positive for breast milk Hematologic/lymphatic: negative Musculoskeletal:negative Neurological: negative Behavioral/Psych: negative Endocrine: negative Allergic/Immunologic: negative    Objective:  Currently breastfeeding without any difficulties.  General:  alert, cooperative, appears stated age and no distress   Breasts:  inspection negative, no nipple discharge or bleeding, no masses or nodularity palpable  Lungs: clear to auscultation bilaterally  Heart:  regular rate and rhythm, S1, S2 normal, no murmur, click, rub or gallop  Abdomen: soft, non-tender; bowel sounds normal; no masses,  no organomegaly   Vulva:  normal per pt report  Vagina: not evaluated  Cervix:  not evaluated  Corpus: not examined  Adnexa:  not evaluated  Rectal Exam: Not performed.        Assessment:    Normal postpartum exam. Pap smear not done at today's visit.   Plan:   1. Contraception: Depo-Provera injections 2. Follow up in: every 3 months for Depo Injections 1 year for AEX or as needed.

## 2018-10-29 NOTE — Patient Instructions (Signed)
Contraceptive Injection A contraceptive injection is a shot that prevents pregnancy. It is also called the birth control shot. The shot contains the hormone progestin, which prevents pregnancy by:  Stopping the ovaries from releasing eggs.  Thickening cervical mucus to prevent sperm from entering the cervix.  Thinning the lining of the uterus to prevent a fertilized egg from attaching to the uterus. Contraceptive injections are given under the skin (subcutaneous) or into a muscle (intramuscular). For these shots to work, you must get one of them every 3 months (12 weeks) from a health care provider. Tell a health care provider about:  Any allergies you have.  All medicines you are taking, including vitamins, herbs, eye drops, creams, and over-the-counter medicines.  Any blood disorders you have.  Any medical conditions you have.  Whether you are pregnant or may be pregnant. What are the risks? Generally, this is a safe procedure. However, problems may occur, including:  Mood changes or depression.  Loss of bone density (osteoporosis) after long-term use.  Blood clots.  Higher risk of an egg being fertilized outside your uterus (ectopic pregnancy).This is rare. What happens before the procedure?  Your health care provider may do a routine physical exam.  You may have a test to make sure you are not pregnant. What happens during the procedure?  The area where the shot will be given will be cleaned and sanitized with alcohol.  A needle will be inserted into a muscle in your upper arm or buttock, or into the skin of your thigh or abdomen. The needle will be attached to a syringe with the medicine inside of it.  The medicine will be pushed through the syringe and injected into your body.  A small bandage (dressing) may be placed over the injection site. What can I expect after the procedure?  After the procedure, it is common to have: ? Soreness around the injection site for  a couple of days. ? Irregular menstrual bleeding. ? Weight gain. ? Breast tenderness. ? Headaches. ? Discomfort in your abdomen.  Ask your health care provider whether you need to use an added method of birth control (backup contraception), such as a condom, sponge, or spermicide. ? If the first shot is given 1-7 days after the start of your last period, you will not need backup contraception. ? If the first shot is given at any other time during your menstrual cycle, you should avoid having sex or you will need backup contraception for 7 days after you receive the shot. Follow these instructions at home: General instructions   Take over-the-counter and prescription medicines only as told by your health care provider.  Do not massage the injection site.  Track your menstrual periods so you will know if they become irregular.  Always use a condom to protect against STIs (sexually transmitted infections).  Make sure you schedule an appointment in time for your next shot, and mark it on your calendar. For the birth control to prevent pregnancy, you must get the injections every 3 months (12 weeks). Lifestyle  Do not use any products that contain nicotine or tobacco, such as cigarettes and e-cigarettes. If you need help quitting, ask your health care provider.  Eat foods that are high in calcium and vitamin D, such as milk, cheese, and salmon. Doing this may help with any loss in bone density that is caused by the contraceptive injection. Ask your health care provider for dietary recommendations. Contact a health care provider if:  You   have nausea or vomiting.  You have abnormal vaginal discharge or bleeding.  You miss a period or you think you might be pregnant.  You experience mood changes or depression.  You feel dizzy or light-headed.  You have leg pain. Get help right away if:  You have chest pain.  You cough up blood.  You have shortness of breath.  You have a  severe headache that does not go away.  You have numbness in any part of your body.  You have slurred speech.  You have vision problems.  You have vaginal bleeding that is abnormally heavy or does not stop.  You have severe pain in your abdomen.  You have depression that does not get better with treatment. If you ever feel like you may hurt yourself or others, or have thoughts about taking your own life, get help right away. You can go to your nearest emergency department or call:  Your local emergency services (911 in the [place]  A suicide crisis helpline, such as the National Suicide Prevention Lifeline at 1-800-273-8255. This is open 24 hours a day. Summary  A contraceptive injection is a shot that prevents pregnancy. It is also called the birth control shot.  The shot is given under the skin (subcutaneous) or into a muscle (intramuscular).  After this procedure, it is common to have soreness around the injection site for a couple of days.  To prevent pregnancy, the shot must be given by a health care provider every 3 months (12 weeks).  After you have the shot, ask your health care provider whether you need to use an added method of birth control (backup contraception), such as a condom, sponge, or spermicide. This information is not intended to replace advice given to you by your health care provider. Make sure you discuss any questions you have with your health care provider. Document Released: 12/04/2016 Document Revised: 03/23/2017 Document Reviewed: 12/04/2016 Elsevier Patient Education  2020 Elsevier Inc.  

## 2018-10-29 NOTE — Progress Notes (Signed)
Administered depo in L Del and pt tolerated well. Next due 9/22-010/6. .. Administrations This Visit    medroxyPROGESTERone (DEPO-PROVERA) injection 150 mg    Admin Date 10/29/2018 Action Given Dose 150 mg Route Intramuscular Administered By Hinton Lovely, RN

## 2018-12-10 ENCOUNTER — Ambulatory Visit (HOSPITAL_COMMUNITY)
Admission: EM | Admit: 2018-12-10 | Discharge: 2018-12-10 | Disposition: A | Discharge status: Home / Self Care | Payer: Medicaid Other | Attending: Emergency Medicine | Admitting: Emergency Medicine

## 2018-12-10 ENCOUNTER — Other Ambulatory Visit: Payer: Self-pay

## 2018-12-10 ENCOUNTER — Encounter (HOSPITAL_COMMUNITY): Payer: Self-pay | Admitting: Emergency Medicine

## 2018-12-10 DIAGNOSIS — R197 Diarrhea, unspecified: Secondary | ICD-10-CM | POA: Diagnosis not present

## 2018-12-10 DIAGNOSIS — R1084 Generalized abdominal pain: Secondary | ICD-10-CM

## 2018-12-10 DIAGNOSIS — Z833 Family history of diabetes mellitus: Secondary | ICD-10-CM | POA: Insufficient documentation

## 2018-12-10 DIAGNOSIS — Z87891 Personal history of nicotine dependence: Secondary | ICD-10-CM | POA: Insufficient documentation

## 2018-12-10 DIAGNOSIS — Z8249 Family history of ischemic heart disease and other diseases of the circulatory system: Secondary | ICD-10-CM | POA: Diagnosis not present

## 2018-12-10 DIAGNOSIS — R030 Elevated blood-pressure reading, without diagnosis of hypertension: Secondary | ICD-10-CM

## 2018-12-10 DIAGNOSIS — Z20828 Contact with and (suspected) exposure to other viral communicable diseases: Secondary | ICD-10-CM | POA: Insufficient documentation

## 2018-12-10 NOTE — ED Triage Notes (Signed)
Pt sts nausea and not feeling well x 2 days

## 2018-12-10 NOTE — Discharge Instructions (Addendum)
Drink plenty of fluids to keep your self hydrated.    Your COVID test is pending.  You should self quarantine until your test result is back and is negative.    Go to the emergency department if you develop worsening abdominal pain, shortness of breath, high fever, severe diarrhea, or other concerning symptoms.    Your blood pressure was elevated today at 152/94.  Have this recheck in 2-4 weeks by your primary care provider or the provider listed below.

## 2018-12-10 NOTE — ED Provider Notes (Signed)
[facility]    CSN: [id number] Arrival date & time: 12/10/18  1400     History   Chief Complaint Chief Complaint  Patient presents with  . Nausea    HPI Joan Dawson is a 28 y.o. female.   Patient presents with 2-day history of cramping abdominal pain, nausea, fatigue, and diarrhea.  She states she had approximately 4 episodes of diarrhea yesterday and has had 2 today.  She denies emesis, fever, chills, cough, shortness of breath.  LMP: Current, started Depo 3 weeks ago.  The history is provided by the patient.    Past Medical History:  Diagnosis Date  . ADHD   . Migraines   . Scoliosis     Patient Active Problem List   Diagnosis Date Noted  . Encounter for postpartum care of lactating mother 10/29/2018  . Encounter for initial prescription of injectable contraceptive 10/29/2018  . Vaginal delivery 09/18/2018  . Post-dates pregnancy 09/17/2018  . Uterine contractions during pregnancy 09/17/2018  . Group beta Strep positive 08/23/2018  . Chlamydia infection affecting pregnancy, antepartum 02/20/2018  . History of migraine 02/18/2018  . Supervision of normal pregnancy, antepartum 02/15/2018    Past Surgical History:  Procedure Laterality Date  . NO PAST SURGERIES      OB History    Gravida  1   Para  1   Term  1   Preterm      AB      Living  1     SAB      TAB      Ectopic      Multiple  0   Live Births  1            Home Medications    Prior to Admission medications   Medication Sig Start Date End Date Taking? Authorizing Provider  ibuprofen (ADVIL) 600 MG tablet Take 1 tablet (600 mg total) by mouth every 6 (six) hours. 09/20/18   Levie HeritageStinson, Jacob J, DO    Family History Family History  Problem Relation Age of Onset  . Diabetes Mother   . Uterine cancer Mother   . Hypertension Mother   . Crohn's disease Sister   . Hypertension Sister   . Hypertension Brother     Social History Social History   Tobacco Use  .  Smoking status: Former Smoker    Types: Cigars    Quit date: 03/07/2018    Years since quitting: 0.7  . Smokeless tobacco: Never Used  . Tobacco comment: BLACK N MILD  Substance Use Topics  . Alcohol use: Yes    Frequency: Never    Comment: OCC.  . Drug use: No     Allergies   Patient has no known allergies.   Review of Systems Review of Systems  Constitutional: Positive for fatigue. Negative for chills and fever.  HENT: Negative for ear pain and sore throat.   Eyes: Negative for pain and visual disturbance.  Respiratory: Negative for cough and shortness of breath.   Cardiovascular: Negative for chest pain and palpitations.  Gastrointestinal: Positive for abdominal pain, diarrhea and nausea. Negative for vomiting.  Genitourinary: Negative for dysuria and hematuria.  Musculoskeletal: Negative for arthralgias and back pain.  Skin: Negative for color change and rash.  Neurological: Negative for seizures and syncope.  All other systems reviewed and are negative.    Physical Exam Triage Vital Signs ED Triage Vitals  Enc Vitals Group     BP 12/10/18 1435 (!) 152/94  Pulse Rate 12/10/18 1435 95     Resp 12/10/18 1435 18     Temp 12/10/18 1435 98.3 F (36.8 C)     Temp Source 12/10/18 1435 Temporal     SpO2 12/10/18 1435 98 %     Weight --      Height --      Head Circumference --      Peak Flow --      Pain Score 12/10/18 1436 3     Pain Loc --      Pain Edu? --      Excl. in [place]? --    No data found.  Updated Vital Signs BP (!) 152/94 (BP Location: Right Arm)   Pulse 95   Temp 98.3 F (36.8 C) (Temporal)   Resp 18   SpO2 98%   Visual Acuity Right Eye Distance:   Left Eye Distance:   Bilateral Distance:    Right Eye Near:   Left Eye Near:    Bilateral Near:     Physical Exam Vitals signs and nursing note reviewed.  Constitutional:      General: She is not in acute distress.    Appearance: She is well-developed.     Comments: Well-appearing.   HENT:     Head: Normocephalic and atraumatic.     Right Ear: Tympanic membrane normal.     Left Ear: Tympanic membrane normal.     Nose: Nose normal.     Mouth/Throat:     Mouth: Mucous membranes are moist.     Pharynx: Oropharynx is clear.  Eyes:     Conjunctiva/sclera: Conjunctivae normal.  Neck:     Musculoskeletal: Neck supple.  Cardiovascular:     Rate and Rhythm: Normal rate and regular rhythm.     Heart sounds: No murmur.  Pulmonary:     Effort: Pulmonary effort is normal. No respiratory distress.     Breath sounds: Normal breath sounds.  Abdominal:     Palpations: Abdomen is soft.     Tenderness: There is no abdominal tenderness. There is no right CVA tenderness, left CVA tenderness, guarding or rebound.  Skin:    General: Skin is warm and dry.     Findings: No rash.  Neurological:     Mental Status: She is alert.      UC Treatments / Results  Labs (all labs ordered are listed, but only abnormal results are displayed) Labs Reviewed  NOVEL CORONAVIRUS, NAA (HOSPITAL ORDER, SEND-OUT TO REF LAB)    EKG   Radiology No results found.  Procedures Procedures (including critical care time)  Medications Ordered in UC Medications - No data to display  Initial Impression / Assessment and Plan / UC Course  I have reviewed the triage vital signs and the nursing notes.  Pertinent labs & imaging results that were available during my care of the patient were reviewed by me and considered in my medical decision making (see chart for details).   Generalized abdominal pain, diarrhea, elevated blood pressure without diagnosis of hypertension.  COVID test performed here.  Instructed patient to self quarantine until her test result is back.  Instructed patient to go to the emergency department if she develops worsening abdominal pain, shortness of breath, high fever, severe diarrhea, or other concerning symptoms.  Discussed with patient that her blood pressure was elevated  today and that she should have this rechecked by her PCP in 2 to 4 weeks.     Final Clinical Impressions(s) /  UC Diagnoses   Final diagnoses:  Generalized abdominal pain  Diarrhea, unspecified type  Elevated blood pressure reading without diagnosis of hypertension     Discharge Instructions     Drink plenty of fluids to keep your self hydrated.    Your COVID test is pending.  You should self quarantine until your test result is back and is negative.    Go to the emergency department if you develop worsening abdominal pain, shortness of breath, high fever, severe diarrhea, or other concerning symptoms.    Your blood pressure was elevated today at 152/94.  Have this recheck in 2-4 weeks by your primary care provider or the provider listed below.         ED Prescriptions    None     Controlled Substance Prescriptions [place] Controlled Substance Registry consulted? Not Applicable   Mickie Bailate, Bartholomew Ramesh H, NP 12/10/18 1520

## 2018-12-11 ENCOUNTER — Encounter (HOSPITAL_COMMUNITY): Payer: Self-pay

## 2018-12-11 LAB — NOVEL CORONAVIRUS, NAA (HOSP ORDER, SEND-OUT TO REF LAB; TAT 18-24 HRS): SARS-CoV-2, NAA: NOT DETECTED

## 2018-12-20 ENCOUNTER — Telehealth: Payer: Self-pay | Admitting: *Deleted

## 2018-12-20 ENCOUNTER — Other Ambulatory Visit: Payer: Self-pay | Admitting: *Deleted

## 2018-12-20 MED ORDER — MEDROXYPROGESTERONE ACETATE 10 MG PO TABS: 10.0000 mg | ORAL_TABLET | Freq: Every day | ORAL | 0 refills | Status: DC

## 2018-12-20 NOTE — Progress Notes (Signed)
Reviewed earlier call with Dr Roselie Awkward. He ordered Provera 10mg  D-20 for pt to help bleeding control.  Pt advised of Rx and recommendations. Pt states she is wanting to change method to OCP.  Pt advised to follow up with office if current Rx does not help bleeding.  Pt advised she may change BC methods any time during her Depo window. Pt made aware she may want to call office prior to next Depo if she wants to change before injection due.  Pt states understanding.

## 2018-12-20 NOTE — Telephone Encounter (Signed)
Spoke with pt today regarding her Depo. Pt states she last got injection in July.  Pt states she had no bleeding for about 1 week after injection. States since that time she has been bleeding like a normal cycle, approx 4-6 weeks.  Pt made aware of possible irregular bleeding with Depo.  Pt would like to know what she should do in order to stop the bleeding. Pt states she may want to switch method to pill.   Pt made aware message to be sent to provider for recommendations.   Please advise.

## 2019-01-20 ENCOUNTER — Ambulatory Visit: Payer: Medicaid Other

## 2019-01-20 ENCOUNTER — Ambulatory Visit (INDEPENDENT_AMBULATORY_CARE_PROVIDER_SITE_OTHER): Payer: Medicaid Other

## 2019-01-20 ENCOUNTER — Other Ambulatory Visit: Payer: Self-pay

## 2019-01-20 VITALS — BP 128/84 | HR 85 | Ht 69.0 in | Wt 254.0 lb

## 2019-01-20 DIAGNOSIS — Z3042 Encounter for surveillance of injectable contraceptive: Secondary | ICD-10-CM

## 2019-01-20 MED ORDER — MEDROXYPROGESTERONE ACETATE 150 MG/ML IM SUSP
150.0000 mg | Freq: Once | INTRAMUSCULAR | Status: AC
  Administered 2019-01-20: 150 mg via INTRAMUSCULAR

## 2019-01-20 MED ORDER — MEDROXYPROGESTERONE ACETATE 150 MG/ML IM SUSP: 150.0000 mg | INTRAMUSCULAR | 3 refills | Status: DC

## 2019-01-20 NOTE — Progress Notes (Signed)
Presents for DEPO Injection, given in RD, tolerated well.  Next DEPO 12/14-28/2020  Administrations This Visit    medroxyPROGESTERone (DEPO-PROVERA) injection 150 mg    Admin Date 01/20/2019 Action Given Dose 150 mg Route Intramuscular Administered By Tamela Oddi, RMA

## 2019-04-14 ENCOUNTER — Ambulatory Visit: Payer: Medicaid Other

## 2019-04-21 ENCOUNTER — Other Ambulatory Visit: Payer: Self-pay

## 2019-04-21 ENCOUNTER — Ambulatory Visit (INDEPENDENT_AMBULATORY_CARE_PROVIDER_SITE_OTHER): Payer: Medicaid Other | Admitting: *Deleted

## 2019-04-21 VITALS — BP 127/87 | HR 91 | Wt 252.0 lb

## 2019-04-21 DIAGNOSIS — Z30013 Encounter for initial prescription of injectable contraceptive: Secondary | ICD-10-CM

## 2019-04-21 DIAGNOSIS — Z3042 Encounter for surveillance of injectable contraceptive: Secondary | ICD-10-CM

## 2019-04-21 NOTE — Progress Notes (Signed)
Pt is in office for Depo injection.  Pt is on time for injection. Pt tolerated injection well.  Pt advised to RTO 3/15-3/29/2021 for Depo.  Pt has no other concerns today.   BP 127/87   Pulse 91   Wt 252 lb (114.3 kg)   BMI 37.21 kg/m   Administrations This Visit    medroxyPROGESTERone (DEPO-PROVERA) injection 150 mg    Admin Date 04/21/2019 Action Given Dose 150 mg Route Intramuscular Administered By Valene Bors, CMA

## 2019-07-08 ENCOUNTER — Ambulatory Visit (INDEPENDENT_AMBULATORY_CARE_PROVIDER_SITE_OTHER): Payer: Medicaid Other | Admitting: Obstetrics

## 2019-07-08 ENCOUNTER — Other Ambulatory Visit: Payer: Self-pay

## 2019-07-08 ENCOUNTER — Other Ambulatory Visit (HOSPITAL_COMMUNITY)
Admission: RE | Admit: 2019-07-08 | Discharge: 2019-07-08 | Disposition: A | Discharge status: Home / Self Care | Payer: Medicaid Other | Source: Ambulatory Visit | Attending: Obstetrics | Admitting: Obstetrics

## 2019-07-08 ENCOUNTER — Encounter: Payer: Self-pay | Admitting: Obstetrics

## 2019-07-08 VITALS — BP 119/79 | HR 81 | Ht 69.0 in | Wt 246.0 lb

## 2019-07-08 DIAGNOSIS — R102 Pelvic and perineal pain unspecified side: Secondary | ICD-10-CM

## 2019-07-08 DIAGNOSIS — Z01419 Encounter for gynecological examination (general) (routine) without abnormal findings: Secondary | ICD-10-CM

## 2019-07-08 DIAGNOSIS — Z3042 Encounter for surveillance of injectable contraceptive: Secondary | ICD-10-CM

## 2019-07-08 MED ORDER — IBUPROFEN 800 MG PO TABS: 800.0000 mg | ORAL_TABLET | Freq: Three times a day (TID) | ORAL | 5 refills | Status: DC | PRN

## 2019-07-08 NOTE — Progress Notes (Signed)
Patient is in the office for pelvic pain. Pt reports pelvic pain/pressure on both sides that started about a month ago. Pt currently on depo and next injection due 07-11-19. Pt also reports brown spotting that started a few days ago. Last pap 02-18-18

## 2019-07-08 NOTE — Progress Notes (Signed)
Subjective:        Joan Dawson is a 29 y.o. female here for a routine exam.  Current complaints: Pelvic pain.  On a scale of 1-10 she rates the pain a 3-4/10   Personal health questionnaire:  Is patient Ashkenazi Jewish, have a family history of breast and/or ovarian cancer: no Is there a family history of uterine cancer diagnosed at age < 65, gastrointestinal cancer, urinary tract cancer, family member who is a Field seismologist syndrome-associated carrier: no Is the patient overweight and hypertensive, family history of diabetes, personal history of gestational diabetes, preeclampsia or PCOS: no Is patient over 46, have PCOS,  family history of premature CHD under age 68, diabetes, smoke, have hypertension or peripheral artery disease:  no At any time, has a partner hit, kicked or otherwise hurt or frightened you?: no Over the past 2 weeks, have you felt down, depressed or hopeless?: no Over the past 2 weeks, have you felt little interest or pleasure in doing things?:no   Gynecologic History No LMP recorded. Patient has had an injection. Contraception: Depo-Provera injections Last Pap: 02-18-2018. Results were: normal Last mammogram: n/a. Results were: n/a  Obstetric History OB History  Gravida Para Term Preterm AB Living  1 1 1     1   SAB TAB Ectopic Multiple Live Births        0 1    # Outcome Date GA Lbr Len/2nd Weight Sex Delivery Anes PTL Lv  1 Term 09/18/18 [redacted]w[redacted]d 01:45 / 00:30 9 lb 4.3 oz (4.204 kg) M Vag-Spont Local  LIV     Birth Comments: umbilical hernia    Past Medical History:  Diagnosis Date  . ADHD   . Migraines   . Scoliosis     Past Surgical History:  Procedure Laterality Date  . NO PAST SURGERIES       Current Outpatient Medications:  .  ibuprofen (ADVIL) 600 MG tablet, Take 1 tablet (600 mg total) by mouth every 6 (six) hours. (Patient not taking: Reported on 01/20/2019), Disp: 30 tablet, Rfl: 0 .  ibuprofen (ADVIL) 800 MG tablet, Take 1 tablet (800 mg  total) by mouth every 8 (eight) hours as needed., Disp: 30 tablet, Rfl: 5 .  medroxyPROGESTERone (DEPO-PROVERA) 150 MG/ML injection, Inject 1 mL (150 mg total) into the muscle every 3 (three) months. (Patient not taking: Reported on 07/08/2019), Disp: 1 mL, Rfl: 3 .  medroxyPROGESTERone (PROVERA) 10 MG tablet, Take 1 tablet (10 mg total) by mouth daily for 20 days., Disp: 20 tablet, Rfl: 0  Current Facility-Administered Medications:  .  medroxyPROGESTERone (DEPO-PROVERA) injection 150 mg, 150 mg, Intramuscular, Q90 days, Laury Deep, CNM, 150 mg at 04/21/19 1541 No Known Allergies  Social History   Tobacco Use  . Smoking status: Former Smoker    Types: Cigars    Quit date: 03/07/2018    Years since quitting: 1.3  . Smokeless tobacco: Never Used  . Tobacco comment: BLACK N MILD  Substance Use Topics  . Alcohol use: Yes    Comment: OCC.    Family History  Problem Relation Age of Onset  . Diabetes Mother   . Uterine cancer Mother   . Hypertension Mother   . Crohn's disease Sister   . Hypertension Sister   . Hypertension Brother       Review of Systems  Constitutional: negative for fatigue and weight loss Respiratory: negative for cough and wheezing Cardiovascular: negative for chest pain, fatigue and palpitations Gastrointestinal: negative for abdominal pain  and change in bowel habits Musculoskeletal:negative for myalgias Neurological: negative for gait problems and tremors Behavioral/Psych: negative for abusive relationship, depression Endocrine: negative for temperature intolerance    Genitourinary:negative for abnormal menstrual periods, genital lesions, hot flashes, sexual problems and vaginal discharge Integument/breast: negative for breast lump, breast tenderness, nipple discharge and skin lesion(s)    Objective:       BP 119/79   Pulse 81   Ht 5\' 9"  (1.753 m)   Wt 246 lb (111.6 kg)   BMI 36.33 kg/m  General:   alert  Skin:   no rash or abnormalities   Lungs:   clear to auscultation bilaterally  Heart:   regular rate and rhythm, S1, S2 normal, no murmur, click, rub or gallop  Breasts:   normal without suspicious masses, skin or nipple changes or axillary nodes  Abdomen:  normal findings: no organomegaly, soft, non-tender and no hernia  Pelvis:  External genitalia: normal general appearance Urinary system: urethral meatus normal and bladder without fullness, nontender Vaginal: normal without tenderness, induration or masses Cervix: normal appearance Adnexa: normal bimanual exam Uterus: anteverted and non-tender, normal size   Lab Review Urine pregnancy test Labs reviewed yes Radiologic studies reviewed no  50% of 25 min visit spent on counseling and coordination of care.   Assessment:     1. Encounter for gynecological examination with Papanicolaou smear of cervix Rx: - Cytology - PAP( [facility])  2. Pelvic pain Rx: - PELVIC COMPLETE WITH TRANSVAGINAL; Future - ibuprofen (ADVIL) 800 MG tablet; Take 1 tablet (800 mg total) by mouth every 8 (eight) hours as needed.  Dispense: 30 tablet; Refill: 5  3. Encounter for surveillance of injectable contraceptive    Plan:    Education reviewed: calcium supplements, depression evaluation, low fat, low cholesterol diet, safe sex/STD prevention, self breast exams and weight bearing exercise. Contraception: Depo-Provera injections. Follow up in: 2 weeks.   Meds ordered this encounter  Medications  . ibuprofen (ADVIL) 800 MG tablet    Sig: Take 1 tablet (800 mg total) by mouth every 8 (eight) hours as needed.    Dispense:  30 tablet    Refill:  5   Orders Placed This Encounter  Procedures  . US PELVIC COMPLETE WITH TRANSVAGINAL    Standing Status:   Future    Standing Expiration Date:   09/06/2020    Order Specific Question:   Reason for Exam (SYMPTOM  OR DIAGNOSIS REQUIRED)    Answer:   Pelvic pain    Order Specific Question:   Preferred imaging location?    Answer:    [facility] Outpatient Ultrasound    [facility], MD 07/08/2019 12:49 PM

## 2019-07-09 LAB — CYTOLOGY - PAP
Adequacy: ABSENT
Comment: NEGATIVE
Diagnosis: NEGATIVE
High risk HPV: POSITIVE — AB

## 2019-07-11 ENCOUNTER — Ambulatory Visit: Payer: Medicaid Other

## 2019-07-15 ENCOUNTER — Ambulatory Visit (HOSPITAL_COMMUNITY)
Admission: RE | Admit: 2019-07-15 | Discharge: 2019-07-15 | Disposition: A | Discharge status: Home / Self Care | Payer: Medicaid Other | Source: Ambulatory Visit | Attending: Obstetrics | Admitting: Obstetrics

## 2019-07-15 ENCOUNTER — Other Ambulatory Visit: Payer: Self-pay

## 2019-07-15 DIAGNOSIS — R102 Pelvic and perineal pain: Secondary | ICD-10-CM | POA: Insufficient documentation

## 2019-07-22 ENCOUNTER — Telehealth (INDEPENDENT_AMBULATORY_CARE_PROVIDER_SITE_OTHER): Payer: Medicaid Other | Admitting: Obstetrics

## 2019-07-22 ENCOUNTER — Encounter: Payer: Self-pay | Admitting: Obstetrics

## 2019-07-22 DIAGNOSIS — Z3009 Encounter for other general counseling and advice on contraception: Secondary | ICD-10-CM

## 2019-07-22 DIAGNOSIS — Z30011 Encounter for initial prescription of contraceptive pills: Secondary | ICD-10-CM | POA: Diagnosis not present

## 2019-07-22 DIAGNOSIS — R102 Pelvic and perineal pain: Secondary | ICD-10-CM | POA: Diagnosis not present

## 2019-07-22 MED ORDER — BALCOLTRA 0.1-20 MG-MCG(21) PO TABS: 1.0000 | ORAL_TABLET | Freq: Every day | ORAL | 11 refills | Status: DC

## 2019-07-22 NOTE — Progress Notes (Signed)
TELEHEALTH VIRTUAL GYNECOLOGY VISIT ENCOUNTER NOTE  I connected with Joan Dawson on 07/22/19 at  9:45 AM EDT by telephone at home and verified that I am speaking with the correct person using two identifiers.   I discussed the limitations, risks, security and privacy concerns of performing an evaluation and management service by telephone and the availability of in person appointments. I also discussed with the patient that there may be a patient responsible charge related to this service. The patient expressed understanding and agreed to proceed.   History:  Joan Dawson is a 29 y.o. [id number] female being evaluated today for follow up for pelvic pain.  Patient states that pain is now resolved. She denies any abnormal vaginal discharge, bleeding, pelvic pain or other concerns.       Past Medical History:  Diagnosis Date  . ADHD   . Migraines   . Scoliosis    Past Surgical History:  Procedure Laterality Date  . NO PAST SURGERIES     The following portions of the patient's history were reviewed and updated as appropriate: allergies, current medications, past family history, past medical history, past social history, past surgical history and problem list.    Health Maintenance:  Normal pap and negative HRHPV on 07-08-2019.    Review of Systems:  Pertinent items noted in HPI and remainder of comprehensive ROS otherwise negative.  Physical Exam:   General:  Alert, oriented and cooperative.   Mental Status: Normal mood and affect perceived. Normal judgment and thought content.  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   Diagnosis      - Negative for  intraepithelial lesion or malignancy (NILM)   Comment Normal Reference Range HPV - Negative    US PELVIC COMPLETE WITH TRANSVAGINAL  Result Date: 07/15/2019 CLINICAL DATA:  Pelvic pain bilaterally for 1 month; on Depo shots, no menses EXAM: TRANSABDOMINAL AND TRANSVAGINAL ULTRASOUND OF PELVIS TECHNIQUE: Both transabdominal and transvaginal ultrasound examinations of the pelvis were performed. Transabdominal technique was performed for global imaging of the pelvis including uterus, ovaries, adnexal regions, and pelvic cul-de-sac. It was necessary to proceed with endovaginal exam following the transabdominal exam to visualize the endometrium and adnexa. COMPARISON:  None FINDINGS: Uterus Measurements: 8.0 x 3.9 x 5.1 cm = volume: 82 mL. Anteverted. Normal morphology without mass Endometrium Thickness: 2 mm.  No endometrial fluid or focal abnormality Right ovary Measurements: 2.4 x 1.5 x 1.9 cm = volume: 3.6 mL. Normal morphology without mass Left ovary Measurements: 3.1 x 2.4 x 1.9 cm = volume: 7.5 mL. Normal morphology without mass Other findings No free pelvic fluid or adnexal masses. IMPRESSION: Normal exam. Electronically Signed   By: Ulyses Southward M.D.   On: 07/15/2019 11:12      Assessment and Plan:     1. Pelvic pain - resolved - ultrasound is WNL's  2. Encounter for other general counseling and advice on contraception - wants to change birth control from Depo to OCP's  3. Encounter for initial prescription of contraceptive pills Rx: - Levonorgest-Eth Estrad-Fe Bisg (BALCOLTRA) 0.1-20 MG-MCG(21) TABS; Take 1 tablet by mouth daily.  Dispense: 28 tablet;  Refill: 11       I discussed the assessment and treatment plan with the patient. The patient was provided an opportunity to ask questions and all were answered. The patient agreed with the plan and demonstrated an understanding of the instructions.   The patient was advised to call back or seek an in-person evaluation/go to the ED if the  symptoms worsen or if the condition fails to improve as anticipated.  I provided 15 minutes of non-face-to-face time during this encounter.   Baltazar Najjar, MD [facility] 07/22/2019

## 2019-10-01 ENCOUNTER — Ambulatory Visit (HOSPITAL_COMMUNITY)
Admission: EM | Admit: 2019-10-01 | Discharge: 2019-10-01 | Disposition: A | Discharge status: Home / Self Care | Payer: Medicaid Other | Attending: Family Medicine | Admitting: Family Medicine

## 2019-10-01 ENCOUNTER — Other Ambulatory Visit: Payer: Self-pay

## 2019-10-01 DIAGNOSIS — Z20822 Contact with and (suspected) exposure to covid-19: Secondary | ICD-10-CM | POA: Insufficient documentation

## 2019-10-01 DIAGNOSIS — H65191 Other acute nonsuppurative otitis media, right ear: Secondary | ICD-10-CM

## 2019-10-01 DIAGNOSIS — H65111 Acute and subacute allergic otitis media (mucoid) (sanguinous) (serous), right ear: Secondary | ICD-10-CM | POA: Diagnosis present

## 2019-10-01 DIAGNOSIS — Z87891 Personal history of nicotine dependence: Secondary | ICD-10-CM | POA: Insufficient documentation

## 2019-10-01 MED ORDER — CETIRIZINE HCL 10 MG PO TABS
10.0000 mg | ORAL_TABLET | Freq: Every day | ORAL | 0 refills | Status: DC
Start: 2019-10-01 — End: 2020-10-27

## 2019-10-01 MED ORDER — IBUPROFEN 600 MG PO TABS
600.0000 mg | ORAL_TABLET | Freq: Three times a day (TID) | ORAL | 0 refills | Status: DC | PRN
Start: 2019-10-01 — End: 2020-10-27

## 2019-10-01 MED ORDER — AMOXICILLIN 500 MG PO CAPS
1000.0000 mg | ORAL_CAPSULE | Freq: Three times a day (TID) | ORAL | 0 refills | Status: AC
Start: 2019-10-01 — End: 2019-10-08

## 2019-10-01 MED ORDER — FLUTICASONE PROPIONATE 50 MCG/ACT NA SUSP
1.0000 | Freq: Every day | NASAL | 2 refills | Status: DC
Start: 2019-10-01 — End: 2020-10-27

## 2019-10-01 NOTE — ED Provider Notes (Signed)
[place]    CSN: [id number] Arrival date & time: 10/01/19  1347      History   Chief Complaint Chief Complaint  Patient presents with  . Otalgia  . Nasal Congestion    HPI Joan Dawson is a 29 y.o. female.   Pt is overall healthy 29 year old who presents with right ear pain and right-sided nasal congestion x 3 days. Reports son has double ear infection, took 1 dose of son's amoxicillin. Pt denies cough, sore throat, chest pain, wheezing, difficulty breathing, fevers. No alleviating measures taken. Hx seasonal allergies.   ROS per HPI      Past Medical History:  Diagnosis Date  . ADHD   . Migraines   . Scoliosis     Patient Active Problem List   Diagnosis Date Noted  . Encounter for postpartum care of lactating mother 10/29/2018  . Encounter for initial prescription of injectable contraceptive 10/29/2018  . Vaginal delivery 09/18/2018  . Post-dates pregnancy 09/17/2018  . Uterine contractions during pregnancy 09/17/2018  . Group beta Strep positive 08/23/2018  . Chlamydia infection affecting pregnancy, antepartum 02/20/2018  . History of migraine 02/18/2018  . Supervision of normal pregnancy, antepartum 02/15/2018    Past Surgical History:  Procedure Laterality Date  . NO PAST SURGERIES      OB History    Gravida  1   Para  1   Term  1   Preterm      AB      Living  1     SAB      TAB      Ectopic      Multiple  0   Live Births  1            Home Medications    Prior to Admission medications   Medication Sig Start Date End Date Taking? Authorizing Provider  amoxicillin (AMOXIL) 500 MG capsule Take 2 capsules (1,000 mg total) by mouth 3 (three) times daily for 7 days. 10/01/19 10/08/19  Loura Halt A, NP  cetirizine (ZYRTEC) 10 MG tablet Take 1 tablet (10 mg total) by mouth daily. 10/01/19   Antonella Upson, Tressia Miners A, NP  fluticasone (FLONASE) 50 MCG/ACT nasal spray Place 1 spray into both nostrils daily. 10/01/19   Loura Halt A, NP    ibuprofen (ADVIL) 600 MG tablet Take 1 tablet (600 mg total) by mouth every 8 (eight) hours as needed for moderate pain. 10/01/19   Loura Halt A, NP  medroxyPROGESTERone (PROVERA) 10 MG tablet Take 1 tablet (10 mg total) by mouth daily for 20 days. 12/20/18 01/09/19  Woodroe Mode, MD  medroxyPROGESTERone (DEPO-PROVERA) 150 MG/ML injection Inject 1 mL (150 mg total) into the muscle every 3 (three) months. Patient not taking: Reported on 07/08/2019 01/20/19 10/01/19  Shelly Bombard, MD    Family History Family History  Problem Relation Age of Onset  . Diabetes Mother   . Uterine cancer Mother   . Hypertension Mother   . Crohn's disease Sister   . Hypertension Sister   . Hypertension Brother     Social History Social History   Tobacco Use  . Smoking status: Former Smoker    Types: Cigars    Quit date: 03/07/2018    Years since quitting: 1.5  . Smokeless tobacco: Never Used  . Tobacco comment: BLACK N MILD  Substance Use Topics  . Alcohol use: Yes    Comment: OCC.  . Drug use: No     Allergies  Patient has no known allergies.   Review of Systems Review of Systems  Constitutional: Negative.   HENT: Positive for ear discharge, ear pain, postnasal drip, rhinorrhea, sinus pressure and sinus pain.   Respiratory: Negative.   Cardiovascular: Negative.   Gastrointestinal: Negative.   Endocrine: Negative.   Genitourinary: Negative.   Musculoskeletal: Negative.   Skin: Negative.   Allergic/Immunologic: Positive for environmental allergies.       Seasonal allergies  Neurological: Negative.   Hematological: Negative.   Psychiatric/Behavioral: Negative.      Physical Exam Triage Vital Signs ED Triage Vitals  Enc Vitals Group     BP 10/01/19 1356 (!) 146/73     Pulse Rate 10/01/19 1356 75     Resp 10/01/19 1356 18     Temp 10/01/19 1356 98.4 F (36.9 C)     Temp Source 10/01/19 1356 Oral     SpO2 10/01/19 1356 100 %     Weight --      Height --      Head  Circumference --      Peak Flow --      Pain Score 10/01/19 1357 4     Pain Loc --      Pain Edu? --      Excl. in GC? --    No data found.  Updated Vital Signs BP (!) 146/73 (BP Location: Right Arm)   Pulse 75   Temp 98.4 F (36.9 C) (Oral)   Resp 18   LMP 09/21/2019 (Approximate)   SpO2 100%   Visual Acuity Right Eye Distance:   Left Eye Distance:   Bilateral Distance:    Right Eye Near:   Left Eye Near:    Bilateral Near:     Physical Exam Constitutional:      Appearance: Normal appearance. She is normal weight.  HENT:     Head: Normocephalic.     Right Ear: Drainage present. Tympanic membrane is erythematous.     Left Ear: Tympanic membrane normal. Left ear retracted TM: Mucoid TM.     Nose: Congestion and rhinorrhea present.     Mouth/Throat:     Mouth: Mucous membranes are moist.     Pharynx: Oropharynx is clear.  Eyes:     Extraocular Movements: Extraocular movements intact.     Conjunctiva/sclera: Conjunctivae normal.     Pupils: Pupils are equal, round, and reactive to light.  Cardiovascular:     Rate and Rhythm: Normal rate and regular rhythm.     Pulses: Normal pulses.     Heart sounds: Normal heart sounds.  Pulmonary:     Effort: Pulmonary effort is normal. No respiratory distress.     Breath sounds: Normal breath sounds.  Musculoskeletal:     Cervical back: Normal range of motion.  Neurological:     Mental Status: She is alert.      UC Treatments / Results  Labs (all labs ordered are listed, but only abnormal results are displayed) Labs Reviewed  SARS CORONAVIRUS 2 (TAT 6-24 HRS)    EKG   Radiology No results found.  Procedures Procedures (including critical care time)  Medications Ordered in UC Medications - No data to display  Initial Impression / Assessment and Plan / UC Course  I have reviewed the triage vital signs and the nursing notes.  Pertinent labs & imaging results that were available during my care of the patient  were reviewed by me and considered in my medical decision making (see chart for  details).     Acute mucoid otitis media of right ear. Treating with amoxicillin. Flonase and Zyrtec for allergy symptoms Ibuprofen for pain as needed Follow up as needed for continued or worsening symptoms  Final Clinical Impressions(s) / UC Diagnoses   Final diagnoses:  Acute mucoid otitis media of right ear     Discharge Instructions     Treating you for an ear infection Take the antibiotics as prescribed.  Allergy medicine daily Ibuprofen as needed Follow up as needed for continued or worsening symptoms     ED Prescriptions    Medication Sig Dispense Auth. Provider   amoxicillin (AMOXIL) 500 MG capsule Take 2 capsules (1,000 mg total) by mouth 3 (three) times daily for 7 days. 42 capsule Elwood Bazinet A, NP   ibuprofen (ADVIL) 600 MG tablet Take 1 tablet (600 mg total) by mouth every 8 (eight) hours as needed for moderate pain. 30 tablet Brandan Robicheaux A, NP   fluticasone (FLONASE) 50 MCG/ACT nasal spray Place 1 spray into both nostrils daily. 16 g Rhyan Radler A, NP   cetirizine (ZYRTEC) 10 MG tablet Take 1 tablet (10 mg total) by mouth daily. 30 tablet Dahlia Byes A, NP     PDMP not reviewed this encounter.   Dahlia Byes A, NP 10/01/19 1550

## 2019-10-01 NOTE — ED Triage Notes (Signed)
Pt c/o right ear pain, nasal congestion, runny nose with yellow sputum for approx 3 days. Fever of 100.6 this morning. Pt states her infant son recently had bilaterally ear infections. Pt states she took 23ml of her son's amoxicillin last night.  Denies sore throat, abdom pain, n/v/d, body aches.

## 2019-10-01 NOTE — Discharge Instructions (Addendum)
Treating you for an ear infection Take the antibiotics as prescribed.  Allergy medicine daily Ibuprofen as needed Follow up as needed for continued or worsening symptoms

## 2019-10-02 LAB — SARS CORONAVIRUS 2 (TAT 6-24 HRS): SARS Coronavirus 2: NEGATIVE

## 2019-11-28 ENCOUNTER — Ambulatory Visit (HOSPITAL_COMMUNITY)
Admission: EM | Admit: 2019-11-28 | Discharge: 2019-11-28 | Disposition: A | Discharge status: Home / Self Care | Payer: Medicaid Other | Attending: Family Medicine | Admitting: Family Medicine

## 2019-11-28 ENCOUNTER — Encounter (HOSPITAL_COMMUNITY): Payer: Self-pay | Admitting: Emergency Medicine

## 2019-11-28 ENCOUNTER — Other Ambulatory Visit: Payer: Self-pay

## 2019-11-28 DIAGNOSIS — M25561 Pain in right knee: Secondary | ICD-10-CM

## 2019-11-28 DIAGNOSIS — S838X1A Sprain of other specified parts of right knee, initial encounter: Secondary | ICD-10-CM

## 2019-11-28 MED ORDER — MELOXICAM 15 MG PO TABS: 15.0000 mg | ORAL_TABLET | Freq: Every day | ORAL | 0 refills | Status: DC

## 2019-11-28 NOTE — ED Provider Notes (Signed)
[facility]   [id number] 11/28/19 Arrival Time: 0903  YH:[id number] PAIN  SUBJECTIVE: History from: patient. Joan Dawson is a 29 y.o. female complains of right knee pain that began about a mont ago. Reports a twisting injury to the R knee. Reports that the knee pops when she bends it or has to squat down. Localizes the pain to the medial aspect of the right knee. Describes the pain as intermittent and achy in character. Has tried OTC medications without relief  Symptoms are made worse with activity, specifically bending the knee. Denies similar symptoms in the past. Denies fever, chills, erythema, ecchymosis, effusion, weakness, numbness and tingling, saddle paresthesias, loss of bowel or bladder function.      ROS: As per HPI.  All other pertinent ROS negative.     Past Medical History:  Diagnosis Date  . ADHD   . Migraines   . Scoliosis    Past Surgical History:  Procedure Laterality Date  . NO PAST SURGERIES     No Known Allergies No current facility-administered medications on file prior to encounter.   Current Outpatient Medications on File Prior to Encounter  Medication Sig Dispense Refill  . cetirizine (ZYRTEC) 10 MG tablet Take 1 tablet (10 mg total) by mouth daily. 30 tablet 0  . fluticasone (FLONASE) 50 MCG/ACT nasal spray Place 1 spray into both nostrils daily. 16 g 2  . ibuprofen (ADVIL) 600 MG tablet Take 1 tablet (600 mg total) by mouth every 8 (eight) hours as needed for moderate pain. 30 tablet 0  . medroxyPROGESTERone (PROVERA) 10 MG tablet Take 1 tablet (10 mg total) by mouth daily for 20 days. 20 tablet 0  . [DISCONTINUED] medroxyPROGESTERone (DEPO-PROVERA) 150 MG/ML injection Inject 1 mL (150 mg total) into the muscle every 3 (three) months. (Patient not taking: Reported on 07/08/2019) 1 mL 3   Social History   Socioeconomic History  . Marital status: Single    Spouse name: Not on file  . Number of children: Not on file  . Years of education: Not on  file  . Highest education level: Not on file  Occupational History  . Not on file  Tobacco Use  . Smoking status: Former Smoker    Types: Cigars    Quit date: 03/07/2018    Years since quitting: 1.7  . Smokeless tobacco: Never Used  . Tobacco comment: BLACK N MILD  Vaping Use  . Vaping Use: Never used  Substance and Sexual Activity  . Alcohol use: Yes    Comment: OCC.  . Drug use: No  . Sexual activity: Not Currently    Birth control/protection: Injection  Other Topics Concern  . Not on file  Social History Narrative  . Not on file   Social Determinants of Health   Financial Resource Strain:   . Difficulty of Paying Living Expenses:   Food Insecurity:   . Worried About Programme researcher, broadcasting/film/video in the Last Year:   . Barista in the Last Year:   Transportation Needs:   . Freight forwarder (Medical):   Marland Kitchen Lack of Transportation (Non-Medical):   Physical Activity:   . Days of Exercise per Week:   . Minutes of Exercise per Session:   Stress:   . Feeling of Stress :   Social Connections:   . Frequency of Communication with Friends and Family:   . Frequency of Social Gatherings with Friends and Family:   . Attends Religious Services:   . Active  Member of Clubs or Organizations:   . Attends Banker Meetings:   Marland Kitchen Marital Status:   Intimate Partner Violence:   . Fear of Current or Ex-Partner:   . Emotionally Abused:   Marland Kitchen Physically Abused:   . Sexually Abused:    Family History  Problem Relation Age of Onset  . Diabetes Mother   . Uterine cancer Mother   . Hypertension Mother   . Crohn's disease Sister   . Hypertension Sister   . Hypertension Brother     OBJECTIVE:  Vitals:   11/28/19 0930  BP: 118/60  Pulse: 92  Resp: 18  Temp: 99 F (37.2 C)  TempSrc: Oral  SpO2: 99%    General appearance: ALERT; in no acute distress.  Head: NCAT Lungs: Normal respiratory effort CV:  pulses 2+ bilaterally. Cap refill < 2  seconds Musculoskeletal:  Inspection: Skin warm, dry, clear and intact without obvious erythema, effusion, or ecchymosis.  Palpation: Medial aspect of right knee tender to palpation ROM: limited ROM active and passive to right knee, cannot flex the knee without reported pain Skin: warm and dry Neurologic: Ambulates without difficulty; Sensation intact about the upper/ lower extremities Psychological: alert and cooperative; normal mood and affect  DIAGNOSTIC STUDIES:  No results found.   ASSESSMENT & PLAN:  1. Acute pain of right knee   2. Sprain of other ligament of right knee, initial encounter      Meds ordered this encounter  Medications  . meloxicam (MOBIC) 15 MG tablet    Sig: Take 1 tablet (15 mg total) by mouth daily.    Dispense:  30 tablet    Refill:  0    Order Specific Question:   Supervising Provider    Answer:   Merrilee Jansky [[id number]]    Prescribed meloxicam Follow up with ortho for possible imaging and further evaluation Continue conservative management of rest, ice, and gentle stretches Take meloxicam as needed for pain relief (may cause abdominal discomfort, ulcers, and GI bleeds avoid taking with other NSAIDs) Follow up with PCP if symptoms persist Return or go to the ER if you have any new or worsening symptoms (fever, chills, chest pain, abdominal pain, changes in bowel or bladder habits, pain radiating into lower legs)   Reviewed expectations re: course of current medical issues. Questions answered. Outlined signs and symptoms indicating need for more acute intervention. Patient verbalized understanding. After Visit Summary given.       Moshe Cipro, NP 11/28/19 1044

## 2019-11-28 NOTE — Discharge Instructions (Addendum)
Take meloxicam daily.  Rest and elevate your leg.  Apply ice packs 2-3 times a day for up to 20 minutes each.  Wear the knee sleeve as needed for comfort.    Follow up with your primary care provider or an orthopedist if you symptoms continue or worsen;  Or if you develop new symptoms, such as numbness, tingling, or weakness.

## 2019-11-28 NOTE — ED Triage Notes (Signed)
Pt  C/o right knee pain onset about a month ago. Pt states her whole leg from knee down to ankle was swollen. Pt states the pain is worse when knee is bent.

## 2019-12-02 ENCOUNTER — Other Ambulatory Visit: Payer: Self-pay

## 2019-12-02 ENCOUNTER — Ambulatory Visit (INDEPENDENT_AMBULATORY_CARE_PROVIDER_SITE_OTHER): Payer: Medicaid Other | Admitting: Family Medicine

## 2019-12-02 DIAGNOSIS — M25561 Pain in right knee: Secondary | ICD-10-CM | POA: Diagnosis present

## 2019-12-02 NOTE — Progress Notes (Signed)
    SUBJECTIVE:   CHIEF COMPLAINT / HPI:   Right knee pain Ms. Joan Dawson is a very pleasant 29 year old female who is a new patient to this practice presenting today for right knee pain.  Began several weeks ago while she was at work as she was outside on gravel she went to turn and run quickly and felt immediate pain on the inside of her right knee.  She did not fall but her leg did swell and she did have significant pain where she was unable to walk normally however she denies ever having a significant limp or being away unable to bear weight on her leg.  She had noted it first due to having a toddler at home and being a single parent however as the pain has continued she felt like she needed to be seen.  Standing is uncomfortable for her especially in long periods.  She also has trouble turning on that knee.  She does feel like the leg is weaker since before the incident but has had no falls.  She denies any sensation of the knee locking, popping, or feeling like it was going to "give out".  Has had no previous known injuries to the right knee.  PERTINENT  PMH / PSH: Obesity  OBJECTIVE:   BP 123/67   Ht 5\' 9"  (1.753 m)   Wt 280 lb (127 kg)   LMP 11/06/2019 (Approximate)   BMI 41.35 kg/m   MSK: Knee, Right: Inspection was negative for erythema, ecchymosis, but notable for effusion. No obvious bony abnormalities or signs of osteophyte development. Palpation yielded no asymmetric warmth; positive for medial joint line tenderness; No condyle tenderness; No patellar tenderness; No patellar crepitus. Patellar and quadriceps tendons unremarkable, positive for tenderness of the pes anserine tendons proximal to insertion on the tibia. No obvious Baker's cyst development. Decreased active ROM in flexion and extension due to pain, can achieve full passive ROM. Normal hamstring and quadriceps strength. Neurovascularly intact bilaterally. Special Tests  - Cruciate/Collateral Ligaments:   - Anterior  Drawer:  NEG - Posterior Drawer: NEG   - Lachman:  NEG   - Varus/Valgus Stress test: NEG  - Meniscus:   - Thessaly: unable to perform   - McMurray's: equiviocal  - Patella:   - Patellar grind/compression: NEG  ASSESSMENT/PLAN:   Right knee pain Given initial injury was over 6 weeks ago she is still having some swelling on exam today along with moderate tenderness to palpation of the medial joint line and pes anserine tendons and some guarding was noted on exam today when trying to test the meniscus there is some concern for a combination of a mild to moderate hamstring strain with possible meniscal injury.   - We will treat conservatively with formal PT and use of a compression sleeve while active. -Recommended 3 weeks of light duty with no bending squatting use of ladders or going up and down stairs. -She was already prescribed meloxicam from an urgent care visit earlier and recommended that she continue to take this daily until it was finished -Follow-up with 11/08/2019 in 6 weeks.  If she has not had any improvement we will consider getting MRI of the right knee.     [place], DO PGY-4, Sports Medicine Fellow [facility]

## 2019-12-02 NOTE — Assessment & Plan Note (Signed)
Given initial injury was over 6 weeks ago she is still having some swelling on exam today along with moderate tenderness to palpation of the pes anserine tendons and some guarding was noted on exam today when trying to test the meniscus there is some concern for a combination of a mild to moderate hamstring strain with possible meniscal injury.   - We will treat conservatively with formal PT and use of a compression sleeve while active. -Recommended 3 weeks of light duty with no bending squatting use of ladders or going up and down stairs. -She was already prescribed meloxicam from an urgent care visit earlier and recommended that she continue to take this daily until it was finished -Follow-up with [place] in 6 weeks.  If she has not had any improvement we will consider getting MRI of the right knee.

## 2019-12-02 NOTE — Patient Instructions (Signed)
It was great to meet you today! Thank you for letting me participate in your care!  Today, we discussed your continued knee pain which is most likely due to a hamstring muscle/tendon strain. We have referred you to physical therapy which will help. Please continue taking the meloxicam daily with food every day even if it doesn't hurt. Please wear your knee brace during the day.   I have recommended you for light duty for the next 3 weeks. If you are no better or get worse please return sooner otherwise, we will see you in 6 weeks.  Be well, [name], DO PGY-4, Sports Medicine Fellow [facility]

## 2019-12-03 ENCOUNTER — Encounter: Payer: Self-pay | Admitting: Family Medicine

## 2019-12-03 ENCOUNTER — Telehealth: Payer: Self-pay | Admitting: General Practice

## 2019-12-03 NOTE — Telephone Encounter (Signed)
Patient was seen yesterday in our office and we referred her for physical therapy. They do not take Medicaid and she would like [place] to find her somewhere else that takes medicaid or is more affordable. Thanks so much

## 2019-12-03 NOTE — Addendum Note (Signed)
Addended by: Annita Brod on: 12/03/2019 05:01 PM   Modules accepted: Orders

## 2019-12-03 NOTE — Telephone Encounter (Signed)
Resubmitted PT order to [facility], who will take medicaid. They will call patient with appt. Attempted to call patient but no answer.

## 2019-12-12 ENCOUNTER — Other Ambulatory Visit: Payer: Self-pay

## 2019-12-12 ENCOUNTER — Ambulatory Visit: Payer: Medicaid Other | Attending: Family Medicine | Admitting: Physical Therapy

## 2019-12-12 ENCOUNTER — Encounter: Payer: Self-pay | Admitting: Physical Therapy

## 2019-12-12 DIAGNOSIS — M25661 Stiffness of right knee, not elsewhere classified: Secondary | ICD-10-CM | POA: Insufficient documentation

## 2019-12-12 DIAGNOSIS — M25561 Pain in right knee: Secondary | ICD-10-CM | POA: Diagnosis present

## 2019-12-12 DIAGNOSIS — R600 Localized edema: Secondary | ICD-10-CM | POA: Insufficient documentation

## 2019-12-12 DIAGNOSIS — M6281 Muscle weakness (generalized): Secondary | ICD-10-CM | POA: Insufficient documentation

## 2019-12-12 NOTE — Patient Instructions (Signed)
Access Code:  URL: [email or web address] Date: 12/12/2019 Prepared by: Roderic Scarce  Exercises Supine Quadricep Sets - 2 x daily - 10 reps - 5 hold Supine Heel Slide - 2 x daily - 10 reps Seated Heel Slide - 2 x daily - 10 reps Seated Long Arc Quad - 2 x daily - 10 reps - 5 hold Single Leg Stance with Support - 2 x daily - 10 reps - as long as able - goal 30 sec hold Small Range Straight Leg Raise - 1 x daily - 10 reps

## 2019-12-12 NOTE — Therapy (Signed)
[facility] [address] [place], [place], [address] Phone: [phone]   Fax:  [phone]  Physical Therapy Evaluation  Patient Details  Name: Joan SwazilandJordan MRN: [id number] Date of Birth: [birth year] Referring Provider (PT): Dr Norton BlizzardShane Hudnall   Encounter Date: 12/12/2019   PT End of Session - 12/12/19 0757    Visit Number 1    Number of Visits 4    Date for PT Re-Evaluation 01/09/20    Authorization Type MCD - submitted for visits    PT Start Time 0758    PT Stop Time 0847    PT Time Calculation (min) 49 min    Activity Tolerance Patient limited by pain    Behavior [facility] tasks assessed/performed           Past Medical History:  Diagnosis Date  . ADHD   . Migraines   . Scoliosis     Past Surgical History:  Procedure Laterality Date  . NO PAST SURGERIES      There were no vitals filed for this visit.    Subjective Assessment - 12/12/19 0757    Subjective Pt reports she was on gravel, made a quick turn and had immediate Rt knee pain, this happened in mid July.  She has a 27 yo and wasn't able to get to MD right away,   She currently has a lot of fluid in her knee.  the posterior knee is feeling better, the quad is still painful.  She is wearing a compression/hinged sleeve on her knee -    How long can you walk comfortably? with brace can walk short distances without pain, without brace has immediate pain    Diagnostic tests none    Patient Stated Goals not sure, she has never been injured before - would like the knee to get better    Currently in Pain? Yes    Pain Score 3    at its worse 8-9/10   Pain Location Knee    Pain Orientation Right;Medial    Pain Descriptors / Indicators Burning;Tightness;Aching    Pain Type Acute pain    Pain Onset More than a month ago    Pain Frequency Constant    Aggravating Factors  bending, weightbearing    Pain Relieving Factors nothing really    Effect of Pain  on Daily Activities using ice twice a day              [facility] - 12/12/19 0001      Assessment   Medical Diagnosis Rt knee pain     Referring Provider (PT) Dr Norton BlizzardShane Hudnall    Onset Date/Surgical Date 11/06/19    Hand Dominance Right    Next MD Visit 01/14/2020    Prior Therapy none      Precautions   Precautions Other (comment)    Precaution Comments ligth duty at work, limit stairs, dont walk more than 10 min at a time.       Balance Screen   Has the patient fallen in the past 6 months No    Has the patient had a decrease in activity level because of a fear of falling?  No    Is the patient reluctant to leave their home because of a fear of falling?  No      Home Environment   Living Environment Private residence    Living Arrangements Children    Home Layout Two level   limits stairs  Prior Function   Level of Independence Independent    Vocation Full time employment    Vocation Requirements works with [employer]    Leisure care for 1 yo son       Observation/Other Assessments-Edema    Edema --   (+) in Rt knee joint      Functional Tests   Functional tests Squat;Single leg stance      Squat   Comments very limited, wt shift to Lt - pt reports the Rt knee felt it was going to roll and unstable       Single Leg Stance   Comments Rt 8 sec, Lt WNL      Posture/Postural Control   Posture Comments LE valgus       ROM / Strength   AROM / PROM / Strength AROM;Strength      AROM   AROM Assessment Site Knee    Right/Left Knee Right   Lt 5 ext to 135   Right Knee Extension -10   PROM -7   Right Knee Flexion 110   PROM 120     Strength   Strength Assessment Site Hip;Knee;Ankle    Right/Left Hip --   Lt 5/5, Rt grossly 4+/5   Right/Left Knee Right   Lt 5/5   Right Knee Flexion 4+/5    Right Knee Extension 4/5   pain medial knee    Right/Left Ankle --   5/5     Flexibility   Soft Tissue Assessment /Muscle Length --   Hamstring WNL, quad NA  d/t pain     Palpation   Patella mobility good medial/lateral, hypomobile superior/inferior     Palpation comment pain on Rt medial tib platuea       Special Tests    Special Tests Knee Special Tests;Meniscus Tests    Meniscus Tests McMurray Test      McMurray Test   Findings Positive    Side Right                      Objective measurements completed on examination: See above findings.       [facility] Adult PT Treatment/Exercise - 12/12/19 0001      Exercises   Exercises Knee/Hip      Knee/Hip Exercises: Standing   SLS Rt as tolerated       Knee/Hip Exercises: Seated   Long Arc Quad Right;10 reps    Heel Slides Right;10 reps      Knee/Hip Exercises: Supine   Quad Sets Right;10 reps   5 sec holds   Heel Slides Right;10 reps      Modalities   Modalities Vasopneumatic      Vasopneumatic   Number Minutes Vasopneumatic  10 minutes    Vasopnuematic Location  Knee   rt   Vasopneumatic Pressure Low   attempted med - had pain   Vasopneumatic Temperature  34                    PT Short Term Goals - 12/12/19 0855      PT SHORT TERM GOAL #1   Title I with initial HEP ( 01/09/2020)    Baseline not exercising    Time 4    Period Weeks    Status New    Target Date 01/09/20      PT SHORT TERM GOAL #2   Title demo Rt knee AROM within 5 degrees of Lt ( 01/09/2020)  Baseline -12 extension to 110 flexion Rt , Lt 5-135 degrees    Time 4    Period Weeks    Status New    Target Date 01/09/20      PT SHORT TERM GOAL #3   Title report =/> 50% reduction of Rt knee pain with ambulation ( 01/09/2020)    Baseline 8-9/10 with ambulation using brace    Time 4    Period Weeks    Status New    Target Date 01/09/20      PT SHORT TERM GOAL #4   Title tolerate functional strengthing for Rt LE ( 01/09/2020)    Baseline not able to tolerate strengtheing in standing d/t pain    Time 4    Period Weeks    Status New    Target Date 01/09/20             PT  Long Term Goals - 12/12/19 0858      PT LONG TERM GOAL #1   Title to be set at renewal                  Plan - 12/12/19 0827    Clinical Impression Statement 29 yo female ~ 6 wks s/p twisting injury to the Rt knee.  She is currently wearing a knee brace when she is up.  Her pain has persisted as has feelings of instability and limited ROM.  Her Rt knee motion and strength is impaired, there is visible edema in the knee jt and she has pain/difficulty with functional tasks.  She has a positive Nigel Berthold test Rt side as well.  She would benefit from PT to restore ROM, strength and fiunctional mobility    Personal Factors and Comorbidities Comorbidity 2    Comorbidities scoliosis, migraines    Examination-Activity Limitations Bathing;Locomotion Level;Transfers;Sleep;Squat;Stairs    Examination-Participation Restrictions Community Activity;Occupation;Other    Stability/Clinical Decision Making Stable/Uncomplicated    Clinical Decision Making Low    Rehab Potential Good    PT Frequency 1x / week    PT Duration 4 weeks    PT Treatment/Interventions Iontophoresis 4mg /ml Dexamethasone;Gait training;Taping;Vasopneumatic Device;Patient/family education;Functional mobility training;Moist Heat;Therapeutic activities;Therapeutic exercise;Cryotherapy;Electrical Stimulation;Neuromuscular re-education;Manual techniques;Dry needling;Passive range of motion    PT Next Visit Plan Progress Rt knee ROM. LE strength and proprioception, modalities PRn for pain and edema    PT Home Exercise Plan    Consulted and Agree with Plan of Care Patient           Patient will benefit from skilled therapeutic intervention in order to improve the following deficits and impairments:  Difficulty walking, Pain, Decreased strength, Decreased mobility, Decreased balance, Decreased range of motion  Visit Diagnosis: Acute pain of right knee - Plan: PT plan of care cert/re-cert  Stiffness of right knee, not  elsewhere classified - Plan: PT plan of care cert/re-cert  Muscle weakness (generalized) - Plan: PT plan of care cert/re-cert  Localized edema - Plan: PT plan of care cert/re-cert     Problem List Patient Active Problem List   Diagnosis Date Noted  . Right knee pain 12/02/2019  . Encounter for postpartum care of lactating mother 10/29/2018  . Encounter for initial prescription of injectable contraceptive 10/29/2018  . Vaginal delivery 09/18/2018  . Post-dates pregnancy 09/17/2018  . Uterine contractions during pregnancy 09/17/2018  . Group beta Strep positive 08/23/2018  . Chlamydia infection affecting pregnancy, antepartum 02/20/2018  . History of migraine 02/18/2018  . Supervision of normal pregnancy, antepartum 02/15/2018  Roderic Scarce PT  12/12/2019, 9:00 AM  [facility] [address] [place], [place], [address] Phone: [phone]   Fax:  [phone]  Name: Faten Dawson MRN: [id number] Date of Birth: Sep 28, 1990

## 2019-12-25 ENCOUNTER — Other Ambulatory Visit: Payer: Self-pay

## 2019-12-25 ENCOUNTER — Ambulatory Visit: Payer: Medicaid Other | Attending: Family Medicine | Admitting: Physical Therapy

## 2019-12-25 ENCOUNTER — Encounter: Payer: Self-pay | Admitting: Physical Therapy

## 2019-12-25 DIAGNOSIS — M25661 Stiffness of right knee, not elsewhere classified: Secondary | ICD-10-CM

## 2019-12-25 DIAGNOSIS — M25561 Pain in right knee: Secondary | ICD-10-CM | POA: Diagnosis present

## 2019-12-25 DIAGNOSIS — R600 Localized edema: Secondary | ICD-10-CM | POA: Diagnosis present

## 2019-12-25 DIAGNOSIS — M6281 Muscle weakness (generalized): Secondary | ICD-10-CM

## 2019-12-25 NOTE — Therapy (Signed)
[facility] [address] [place], [place], [address] Phone: [phone]   Fax:  [phone]  Physical Therapy Treatment  Patient Details  Name: Joan Dawson MRN: [id number] Date of Birth: 1991/01/24 Referring Provider (PT): Dr Norton Blizzard   Encounter Date: 12/25/2019   PT End of Session - 12/25/19 0721    Visit Number 2    Number of Visits 4    Date for PT Re-Evaluation 01/09/20    Authorization Type MCD - submitted for visits    PT Start Time 0715    PT Stop Time 0800    PT Time Calculation (min) 45 min           Past Medical History:  Diagnosis Date  . ADHD   . Migraines   . Scoliosis     Past Surgical History:  Procedure Laterality Date  . NO PAST SURGERIES      There were no vitals filed for this visit.   Subjective Assessment - 12/25/19 0717    Subjective I do not get the shooting pain as much as I did. Doing the exercises, still have difficulty standing. on one leg and bending it as far as I can. It still hurts at work because We are short staffed and I have to do more than light duty,    Currently in Pain? No/denies              [facility] PT Assessment - 12/25/19 0001      AROM   Right Knee Extension -10    Right Knee Flexion 112                         [facility] Adult PT Treatment/Exercise - 12/25/19 0001      Knee/Hip Exercises: Stretches   Active Hamstring Stretch 3 reps;10 seconds    Gastroc Stretch Limitations supine with strap       Knee/Hip Exercises: Aerobic   Recumbent Bike Full revs x 5 minutes       Knee/Hip Exercises: Standing   SLS --      Knee/Hip Exercises: Seated   Long Arc Quad Right;15 reps    Heel Slides Right;20 reps    Other Seated Knee/Hip Exercises seated Qs with heel on floor 5 sec x 10       Knee/Hip Exercises: Supine   Quad Sets Right;10 reps   5 sec holds   Heel Slides Right;10 reps    Straight Leg Raises 10 reps      Vasopneumatic   Number  Minutes Vasopneumatic  10 minutes    Vasopnuematic Location  Knee    Vasopneumatic Pressure Low    Vasopneumatic Temperature  34                    PT Short Term Goals - 12/12/19 0855      PT SHORT TERM GOAL #1   Title I with initial HEP ( 01/09/2020)    Baseline not exercising    Time 4    Period Weeks    Status New    Target Date 01/09/20      PT SHORT TERM GOAL #2   Title demo Rt knee AROM within 5 degrees of Lt ( 01/09/2020)    Baseline -12 extension to 110 flexion Rt , Lt 5-135 degrees    Time 4    Period Weeks    Status New    Target Date 01/09/20  PT SHORT TERM GOAL #3   Title report =/> 50% reduction of Rt knee pain with ambulation ( 01/09/2020)    Baseline 8-9/10 with ambulation using brace    Time 4    Period Weeks    Status New    Target Date 01/09/20      PT SHORT TERM GOAL #4   Title tolerate functional strengthing for Rt LE ( 01/09/2020)    Baseline not able to tolerate strengtheing in standing d/t pain    Time 4    Period Weeks    Status New    Target Date 01/09/20             PT Long Term Goals - 12/12/19 0858      PT LONG TERM GOAL #1   Title to be set at renewal                 Plan - 12/25/19 0753    Clinical Impression Statement Pt reports less shooting pain. She is still very limited by pain at work and has trouble remaining on light duty tasks only due to staffing. Able to begin revolutions on rec bike and add gastroc and hamstring stretches. She is guarded with therex and reports anterior/medial knee pain and pulling in hamstring. Increased discomfort at end of session. Vaso used to decrease pain and inflammation. She does not less overall swelling and less need for ice at home.    PT Next Visit Plan Progress Rt knee ROM. LE strength and proprioception, modalities PRn for pain and edema    PT Home Exercise Plan [email or web address]: LAQ, QS SLR, heel slide, SLS, added H/s and calf stretrch with strap           Patient will  benefit from skilled therapeutic intervention in order to improve the following deficits and impairments:  Difficulty walking, Pain, Decreased strength, Decreased mobility, Decreased balance, Decreased range of motion  Visit Diagnosis: Acute pain of right knee  Stiffness of right knee, not elsewhere classified  Muscle weakness (generalized)  Localized edema     Problem List Patient Active Problem List   Diagnosis Date Noted  . Right knee pain 12/02/2019  . Encounter for postpartum care of lactating mother 10/29/2018  . Encounter for initial prescription of injectable contraceptive 10/29/2018  . Vaginal delivery 09/18/2018  . Post-dates pregnancy 09/17/2018  . Uterine contractions during pregnancy 09/17/2018  . Group beta Strep positive 08/23/2018  . Chlamydia infection affecting pregnancy, antepartum 02/20/2018  . History of migraine 02/18/2018  . Supervision of normal pregnancy, antepartum 02/15/2018    Sherrie Mustache, PTA 12/25/2019, 8:00 AM  [facility] [address] [place], [place], [address] Phone: [phone]   Fax:  [phone]  Name: Marshae Dawson MRN: [id number] Date of Birth: [birth year]

## 2019-12-25 NOTE — Patient Instructions (Signed)
Access Code: [email or web address] URL: [email or web address] Date: 12/25/2019 Prepared by: Jannette Spanner   Hooklying Hamstring Stretch with Strap - 2 x daily - 7 x weekly - 1 sets - 3 reps - 10-20 hold Seated Gastroc Stretch with Strap - 2 x daily - 7 x weekly - 1 sets - 3 reps - 10-20 hold

## 2020-01-01 ENCOUNTER — Ambulatory Visit: Payer: Medicaid Other | Admitting: Physical Therapy

## 2020-01-01 ENCOUNTER — Other Ambulatory Visit: Payer: Self-pay

## 2020-01-01 ENCOUNTER — Encounter: Payer: Self-pay | Admitting: Physical Therapy

## 2020-01-01 DIAGNOSIS — M25561 Pain in right knee: Secondary | ICD-10-CM | POA: Diagnosis not present

## 2020-01-01 DIAGNOSIS — R600 Localized edema: Secondary | ICD-10-CM

## 2020-01-01 DIAGNOSIS — M25661 Stiffness of right knee, not elsewhere classified: Secondary | ICD-10-CM

## 2020-01-01 DIAGNOSIS — M6281 Muscle weakness (generalized): Secondary | ICD-10-CM

## 2020-01-01 NOTE — Therapy (Addendum)
[place], [place], [address] Phone: [phone]   Fax:  [phone]  Physical Therapy Treatment  Patient Details  Name: Joan Dawson MRN: [id number] Date of Birth: 07-03-1990 Referring Provider (PT): Dr Karlton Lemon   Encounter Date: 01/01/2020   PT End of Session - 01/01/20 0722    Visit Number 3    Number of Visits 4    Date for PT Re-Evaluation 01/09/20    Authorization Type MCD - submitted for visits    PT Start Time 0716    PT Stop Time 0805    PT Time Calculation (min) 49 min           Past Medical History:  Diagnosis Date   ADHD    Migraines    Scoliosis     Past Surgical History:  Procedure Laterality Date   NO PAST SURGERIES      There were no vitals filed for this visit.   Subjective Assessment - 01/01/20 0719    Subjective I am alot better. I am pushing myself to do my exercises and walking more at work.    Currently in Pain? No/denies              [facility] PT Assessment - 01/01/20 0001      AROM   Right Knee Extension -10    Right Knee Flexion 128                         [facility] Adult PT Treatment/Exercise - 01/01/20 0001      Knee/Hip Exercises: Stretches   Active Hamstring Stretch 3 reps;10 seconds    Active Hamstring Stretch Limitations supine and seated     Hip Flexor Stretch Limitations modified thomas with intermittent knee bends     Gastroc Stretch Limitations runners stretch       Knee/Hip Exercises: Aerobic   Recumbent Bike 5 minutes L1       Knee/Hip Exercises: Standing   Forward Step Up 10 reps;Hand Hold: 1;Step Height: 6"    Functional Squat Limitations sink squat x 10    SLS 17 sec , right, level surface       Knee/Hip Exercises: Seated   Heel Slides Right;20 reps    Other Seated Knee/Hip Exercises seated Qs with heel on floor 5 sec x 10     Sit to Sand 10 reps   from mat without UE     Knee/Hip Exercises: Supine   Quad Sets  Right;10 reps   5 sec holds   Quad Sets Limitations tactile cues     Heel Slides Right;10 reps    Straight Leg Raises 15 reps    Straight Leg Raises Limitations cues for initial quad set, maintain knee extension.       Vasopneumatic   Number Minutes Vasopneumatic  10 minutes    Vasopnuematic Location  Knee    Vasopneumatic Pressure Medium   requested more compression    Vasopneumatic Temperature  34                    PT Short Term Goals - 01/01/20 0723      PT SHORT TERM GOAL #1   Title I with initial HEP ( 01/09/2020)    Baseline increased compliance with HEP    Time 4    Period Weeks    Status Achieved      PT SHORT TERM GOAL #2  Title demo Rt knee AROM within 5 degrees of Lt ( 01/09/2020)    Time 4    Period Weeks    Status On-going      PT SHORT TERM GOAL #3   Title report =/> 50% reduction of Rt knee pain with ambulation ( 01/09/2020)    Baseline activity at work pain 4/10 after 1-2 hours.    Time 4    Period Weeks    Status Achieved      PT SHORT TERM GOAL #4   Title tolerate functional strengthing for Rt LE ( 01/09/2020)    Baseline not able to tolerate strengtheing in standing d/t pain    Time 4    Period Weeks    Status On-going             PT Long Term Goals - 12/12/19 5638      PT LONG TERM GOAL #1   Title to be set at renewal                 Plan - 01/01/20 0722    Clinical Impression Statement Pt reports improvement in pain. She is tolerating activity on feet at work for 1.5-2 hours before pain limits her. Her flexion AROM has improved. She still lacks full knee extension. She is able to tolerate closed chain/functional strength today. Overall 50%reduction in pain with activity on her feet. STG#1, #3 met.    PT Next Visit Plan Progress Rt knee ROM. LE strength and proprioception    PT Home Exercise Plan [email or web address]: LAQ, QS SLR, heel slide, SLS, added H/s and calf stretrch with strap           Patient will benefit from skilled  therapeutic intervention in order to improve the following deficits and impairments:  Difficulty walking, Pain, Decreased strength, Decreased mobility, Decreased balance, Decreased range of motion  Visit Diagnosis: Acute pain of right knee  Stiffness of right knee, not elsewhere classified  Muscle weakness (generalized)  Localized edema     Problem List Patient Active Problem List   Diagnosis Date Noted   Right knee pain 12/02/2019   Encounter for postpartum care of lactating mother 10/29/2018   Encounter for initial prescription of injectable contraceptive 10/29/2018   Vaginal delivery 09/18/2018   Post-dates pregnancy 09/17/2018   Uterine contractions during pregnancy 09/17/2018   Group beta Strep positive 08/23/2018   Chlamydia infection affecting pregnancy, antepartum 02/20/2018   History of migraine 02/18/2018   Supervision of normal pregnancy, antepartum 02/15/2018    Dorene Ar, PTA 01/01/2020, 8:03 AM  [facility] [address] [place], [place], [address] Phone: [phone]   Fax:  [phone]  Name: Joan Dawson MRN: [id number] Date of Birth: [birth year]

## 2020-01-08 ENCOUNTER — Encounter: Payer: Self-pay | Admitting: Physical Therapy

## 2020-01-08 ENCOUNTER — Ambulatory Visit: Payer: Medicaid Other | Admitting: Physical Therapy

## 2020-01-08 ENCOUNTER — Other Ambulatory Visit: Payer: Self-pay

## 2020-01-08 DIAGNOSIS — R600 Localized edema: Secondary | ICD-10-CM

## 2020-01-08 DIAGNOSIS — M6281 Muscle weakness (generalized): Secondary | ICD-10-CM

## 2020-01-08 DIAGNOSIS — M25561 Pain in right knee: Secondary | ICD-10-CM

## 2020-01-08 DIAGNOSIS — M25661 Stiffness of right knee, not elsewhere classified: Secondary | ICD-10-CM

## 2020-01-08 NOTE — Therapy (Signed)
[facility] [address] [place], [place], [address] Phone: [phone]   Fax:  [phone]  Physical Therapy Treatment  Patient Details  Name: Joan Dawson MRN: [id number] Date of Birth: 1991/02/27 Referring Provider (PT): Dr. Norton Blizzard    Encounter Date: 01/08/2020   PT End of Session - 01/08/20 0755    Visit Number 4    Number of Visits 12    Date for PT Re-Evaluation 03/05/20    PT Start Time 0745    PT Stop Time 0830    PT Time Calculation (min) 45 min    Activity Tolerance Patient tolerated treatment well    Behavior During Therapy [facility] for tasks assessed/performed           Past Medical History:  Diagnosis Date  . ADHD   . Migraines   . Scoliosis     Past Surgical History:  Procedure Laterality Date  . NO PAST SURGERIES      There were no vitals filed for this visit.   Subjective Assessment - 01/08/20 0753    Subjective I am hard headed and dont always weasr my brace.  Light duty is becoming a bit more intense.  Not lifting but standing more.    Currently in Pain? No/denies    Pain Orientation Right;Medial;Proximal    Pain Descriptors / Indicators Aching;Tightness    Pain Type Acute pain    Pain Onset More than a month ago    Pain Frequency Intermittent    Aggravating Factors  standing, walking and not wearing brace    Pain Relieving Factors rest, brace sometimes uses ice              [facility] PT Assessment - 01/08/20 0001      Assessment   Referring Provider (PT) Dr. Norton Blizzard       Precautions   Precautions None      Home Environment   Living Environment Private residence    Living Arrangements Children    Home Layout Two level   limits stairs      Prior Function   Level of Independence Independent    Vocation Full time employment    Vocation Requirements works with [employer]    Leisure care for 1 yo son       Squat   Comments better than initial, valgus and poor hip  hinge, tight post knee       Single Leg Stance   Comments can statically stand 30 sec       AROM   Right Knee Extension -7    Right Knee Flexion 130      Strength   Right Knee Flexion 5/5    Right Knee Extension 5/5                         [facility] Adult PT Treatment/Exercise - 01/08/20 0001      Knee/Hip Exercises: Stretches   Active Hamstring Stretch Right;3 reps;30 seconds      Knee/Hip Exercises: Aerobic   Nustep L6 LE only for 6 min       Knee/Hip Exercises: Standing   Functional Squat Limitations facing out from sink , no wgt then 15 lbs KB x 10    SLS with Vectors Rt leg then semicircles and hip flexion , cues to unlock knee     Other Standing Knee Exercises deadlift 15 lbs x 15       Knee/Hip Exercises:  Supine   Bridges Strengthening;Both;1 set;10 reps    Straight Leg Raises Right;1 set;10 reps    Straight Leg Raise with External Rotation Right;1 set;10 reps    Knee Extension Limitations ball squeeze with knee ext x 10       Knee/Hip Exercises: Sidelying   Hip ABduction Strengthening;Both;1 set;10 reps    Clams x 20       Modalities   Modalities Cryotherapy      Cryotherapy   Number Minutes Cryotherapy 6 Minutes    Cryotherapy Location Knee    Type of Cryotherapy Ice pack                    PT Short Term Goals - 01/01/20 3299      PT SHORT TERM GOAL #1   Title I with initial HEP ( 01/09/2020)    Baseline increased compliance with HEP    Time 4    Period Weeks    Status Achieved      PT SHORT TERM GOAL #2   Title demo Rt knee AROM within 5 degrees of Lt ( 01/09/2020)    Time 4    Period Weeks    Status On-going      PT SHORT TERM GOAL #3   Title report =/> 50% reduction of Rt knee pain with ambulation ( 01/09/2020)    Baseline activity at work pain 4/10 after 1-2 hours.    Time 4    Period Weeks    Status Achieved      PT SHORT TERM GOAL #4   Title tolerate functional strengthing for Rt LE ( 01/09/2020)    Baseline not  able to tolerate strengtheing in standing d/t pain    Time 4    Period Weeks    Status On-going             PT Long Term Goals - 12/12/19 2426      PT LONG TERM GOAL #1   Title to be set at renewal                 Plan - 01/08/20 0805    Clinical Impression Statement Patient with improvements in ROM and strength, progressing quickly.  She cont to need work on single leg stability and proprioception, walking tolerance and more dynamic closed chain activities.  She needs to be able to carry her child and lift him from the floor.    Personal Factors and Comorbidities Comorbidity 2    Comorbidities scoliosis, migraines    Examination-Activity Limitations Bathing;Locomotion Level;Transfers;Sleep;Squat;Stairs;Lift;Carry;Caring for Others;Bend    Examination-Participation Restrictions Community Activity;Occupation;Other    Stability/Clinical Decision Making Stable/Uncomplicated    Clinical Decision Making Low    Rehab Potential Good    PT Frequency 1x / week    PT Duration 8 weeks    PT Treatment/Interventions Iontophoresis 4mg /ml Dexamethasone;Gait training;Taping;Vasopneumatic Device;Patient/family education;Functional mobility training;Moist Heat;Therapeutic activities;Therapeutic exercise;Cryotherapy;Electrical Stimulation;Neuromuscular re-education;Manual techniques;Dry needling;Passive range of motion    PT Next Visit Plan LE strength and proprioception, closed chain    PT Home Exercise Plan : LAQ, QS SLR, heel slide, SLS, added H/s and calf stretrch with strap    Consulted and Agree with Plan of Care Patient           Patient will benefit from skilled therapeutic intervention in order to improve the following deficits and impairments:  Difficulty walking, Pain, Decreased strength, Decreased mobility, Decreased balance, Decreased range of motion  Visit Diagnosis: Acute pain of right knee  Stiffness of right knee, not elsewhere classified  Muscle weakness  (generalized)  Localized edema     Problem List Patient Active Problem List   Diagnosis Date Noted  . Right knee pain 12/02/2019  . Encounter for postpartum care of lactating mother 10/29/2018  . Encounter for initial prescription of injectable contraceptive 10/29/2018  . Vaginal delivery 09/18/2018  . Post-dates pregnancy 09/17/2018  . Uterine contractions during pregnancy 09/17/2018  . Group beta Strep positive 08/23/2018  . Chlamydia infection affecting pregnancy, antepartum 02/20/2018  . History of migraine 02/18/2018  . Supervision of normal pregnancy, antepartum 02/15/2018    Henley Boettner 01/08/2020, 10:37 AM  [facility] [address] [place], [place], [address] Phone: [phone]   Fax:  [phone]  Name: Sammi Dawson MRN: [id number] Date of Birth: Jun 01, 1990  Karie Mainland, PT 01/08/20 10:38 AM Phone: [phone] Fax: [phone]

## 2020-01-14 ENCOUNTER — Encounter: Payer: Self-pay | Admitting: Physical Therapy

## 2020-01-14 ENCOUNTER — Other Ambulatory Visit: Payer: Self-pay

## 2020-01-14 ENCOUNTER — Ambulatory Visit (INDEPENDENT_AMBULATORY_CARE_PROVIDER_SITE_OTHER): Payer: Medicaid Other | Admitting: Family Medicine

## 2020-01-14 ENCOUNTER — Ambulatory Visit: Payer: Medicaid Other | Admitting: Physical Therapy

## 2020-01-14 VITALS — BP 114/64 | Ht 69.0 in | Wt 274.0 lb

## 2020-01-14 DIAGNOSIS — M25561 Pain in right knee: Secondary | ICD-10-CM

## 2020-01-14 DIAGNOSIS — M6281 Muscle weakness (generalized): Secondary | ICD-10-CM

## 2020-01-14 DIAGNOSIS — R600 Localized edema: Secondary | ICD-10-CM

## 2020-01-14 DIAGNOSIS — M25661 Stiffness of right knee, not elsewhere classified: Secondary | ICD-10-CM

## 2020-01-14 NOTE — Therapy (Signed)
[facility] [address] [place], [place], [address] Phone: [phone]   Fax:  [phone]  Physical Therapy Treatment  Patient Details  Name: Joan Dawson MRN: [id number] Date of Birth: Oct 29, 1990 Referring Provider (PT): Dr. Norton Blizzard    Encounter Date: 01/14/2020   PT End of Session - 01/14/20 0717    Visit Number 5    Number of Visits 12    Date for PT Re-Evaluation 03/05/20    Authorization Type MCD - submitted for visits    PT Start Time 0715    PT Stop Time 0802    PT Time Calculation (min) 47 min           Past Medical History:  Diagnosis Date  . ADHD   . Migraines   . Scoliosis     Past Surgical History:  Procedure Laterality Date  . NO PAST SURGERIES      There were no vitals filed for this visit.   Subjective Assessment - 01/14/20 0716    Subjective Soreness after working long hours over the last 2 days.    Currently in Pain? No/denies              [facility] PT Assessment - 01/14/20 0001      AROM   Right Knee Extension 0    Right Knee Flexion 134                         [facility] Adult PT Treatment/Exercise - 01/14/20 0001      Knee/Hip Exercises: Stretches   Active Hamstring Stretch Right;3 reps;30 seconds    Active Hamstring Stretch Limitations supine     Hip Flexor Stretch Limitations modified thomas with intermittent knee bends     Gastroc Stretch Limitations slant board       Knee/Hip Exercises: Aerobic   Elliptical L1 R1 x 3 minutes    1 minue in retro    Recumbent Bike 5 minutes L2    Nustep --      Knee/Hip Exercises: Standing   Heel Raises 10 reps    Heel Raises Limitations also single leg, decreased strength in right vs left     Forward Step Up 15 reps;Step Height: 6"    Functional Squat Limitations goblet squat x 15 ,  15lb  x 15     Rebounder red ball on and off foam trials       Knee/Hip Exercises: Supine   Straight Leg Raises 15 reps    Straight Leg  Raises Limitations cues for initial quad set, maintain knee extension.       Knee/Hip Exercises: Sidelying   Hip ABduction Strengthening;Both;1 set;15 reps    Clams x 20       Cryotherapy   Number Minutes Cryotherapy 8 Minutes    Cryotherapy Location Knee    Type of Cryotherapy Ice pack                    PT Short Term Goals - 01/01/20 0630      PT SHORT TERM GOAL #1   Title I with initial HEP ( 01/09/2020)    Baseline increased compliance with HEP    Time 4    Period Weeks    Status Achieved      PT SHORT TERM GOAL #2   Title demo Rt knee AROM within 5 degrees of Lt ( 01/09/2020)    Time 4    Period  Weeks    Status On-going      PT SHORT TERM GOAL #3   Title report =/> 50% reduction of Rt knee pain with ambulation ( 01/09/2020)    Baseline activity at work pain 4/10 after 1-2 hours.    Time 4    Period Weeks    Status Achieved      PT SHORT TERM GOAL #4   Title tolerate functional strengthing for Rt LE ( 01/09/2020)    Baseline not able to tolerate strengtheing in standing d/t pain    Time 4    Period Weeks    Status On-going             PT Long Term Goals - 12/12/19 7517      PT LONG TERM GOAL #1   Title to be set at renewal                 Plan - 01/14/20 0831    Clinical Impression Statement Pt reports reports soreness from working increased hours. Able to continue closed chain focus and Single leg stability. ROM improved 0-134 knee AROM on right.    PT Next Visit Plan LE strength and proprioception, closed chain    PT Home Exercise Plan [email or web address]: LAQ, QS SLR, heel slide, SLS, added H/s and calf stretrch with strap           Patient will benefit from skilled therapeutic intervention in order to improve the following deficits and impairments:  Difficulty walking, Pain, Decreased strength, Decreased mobility, Decreased balance, Decreased range of motion  Visit Diagnosis: Acute pain of right knee  Stiffness of right knee, not elsewhere  classified  Muscle weakness (generalized)  Localized edema     Problem List Patient Active Problem List   Diagnosis Date Noted  . Right knee pain 12/02/2019  . Encounter for postpartum care of lactating mother 10/29/2018  . Encounter for initial prescription of injectable contraceptive 10/29/2018  . Vaginal delivery 09/18/2018  . Post-dates pregnancy 09/17/2018  . Uterine contractions during pregnancy 09/17/2018  . Group beta Strep positive 08/23/2018  . Chlamydia infection affecting pregnancy, antepartum 02/20/2018  . History of migraine 02/18/2018  . Supervision of normal pregnancy, antepartum 02/15/2018    Jannette Spanner [facility] 01/14/2020, 8:55 AM  [facility] [address] [place], [place], [address] Phone: [phone]   Fax:  [phone]  Name: Joan Dawson MRN: [id number] Date of Birth: April 27, 1990

## 2020-01-14 NOTE — Patient Instructions (Signed)
It was great to see you today! Thank you for letting me participate in your care!  Today, we discussed your right knee pain and I am glad you are doing better. I still suspect a meniscal injury but given your improvement with physical therapy I think we should have you finish that and then follow up with me after to discuss what the next best steps should be.  In the meantime, you can continue wearing the brace as needed, using ice, elevation, and taking OTC medications such as Ibuprofen.  Be well, [name], DO PGY-4, Sports Medicine Fellow [facility]

## 2020-01-14 NOTE — Progress Notes (Signed)
° ° °  SUBJECTIVE:   CHIEF COMPLAINT / HPI:   Right knee pain Joan Dawson is a pleasant 29 year old female who presents today for follow-up due to her right knee pain after having attended several sessions of formal physical therapy.  She states overall she feels like her pain has improved and she has regained some strength however she is still having some knee pain.  The swelling has improved however she did have 2 episodes where it felt like her knee was going to lock up but it did not lock however it got very painful very quickly and she felt like she needed to sit down and move it slowly almost like it is if it was catching on something.  Pain is mostly at the lateral joint line and is responding to physical therapy but the pain will wax and wane.  PERTINENT  PMH / PSH: Obesity  OBJECTIVE:   BP 114/64    Ht 5\' 9"  (1.753 m)    Wt 274 lb (124.3 kg)    BMI 40.46 kg/m   [facility] 01/14/2020  Frequency of aerobic exercise (# of days/week) 0  Average time in minutes 0  Frequency of strengthening activities (# of days/week) 0   Knee, Right: Inspection was negative for erythema, ecchymosis, and effusion. No obvious bony abnormalities or signs of osteophyte development. Palpation yielded no asymmetric warmth; Positive for lateral joint line tenderness; No condyle tenderness; No patellar tenderness; No patellar crepitus. Patellar and quadriceps tendons unremarkable, and no tenderness of the pes anserine bursa. No obvious Baker's cyst development. ROM normal in flexion (135 degrees) and extension (0 degrees). Normal hamstring and quadriceps strength. Neurovascularly intact bilaterally. Special Tests  - Cruciate Ligaments:   - Anterior Drawer:  NEG - Posterior Drawer: NEG  - Collateral Ligaments:   - Varus/Valgus Stress test: NEG  - Meniscus:   - Thessaly: NEG   - McMurray's: NEG  - Patella:   - Patellar grind/compression: NEG   - Patellar glide: Without  apprehension   ASSESSMENT/PLAN:   Right knee pain Overall her knee pain is improving as I think this is partly due to the rehab of the hamstring which was not tender or weak on exam today.  She also had much less guarding on exam today.  I discussed at length with her going forward her options including MRI however I did stress to her that the only point in getting an MRI would be a willingness to undergo surgical operation.  Also discussed with her that I thought it was reasonable to delay any further imaging and finish formal physical therapy to see if the pain would improve as it has with time and increased strength training and mobility. - Patient can continue wearing Impression sleeve  and using meloxicam as needed. - Patient scheduled to finish formal physical therapy at the end of October at which time she should return to November and we will assess and see how much improvement she has made.  At that time we can decide to proceed with MRI pain is not much improved or if it is negatively impacting her function.      [place], DO PGY-4, Sports Medicine Fellow [facility]

## 2020-01-14 NOTE — Assessment & Plan Note (Signed)
Knee pain which I now suspect did contain an element of a meniscal injury she continues to have joint line pain. However interestingly this time is on the lateral side and not on the medial side.Overall her knee pain is improving as I think this is partly due to the rehab of the hamstring which was not tender or weak on exam today.  She also had much less guarding on exam today.  I discussed at length with her going forward for her options were including MRI however I did stress to her that the only point get an MRI would be a willingness to undergo surgical operation which would require more rehab for her knee.  Also discussed with her that I thought it was reasonable to delay any further imaging and finish formal physical therapy to see if the pain would improve as it has with time and increased strength training and mobility. - Patient can continue wearing Impression sleeve  and using meloxicam as needed. - Patient scheduled to finish formal physical therapy at the end of October at which time she should return to [place] and we will assess and see how much improvement she has made.  At that time we can decide to proceed with MRI pain is not much improved or if it is negatively impacting her function.

## 2020-01-22 ENCOUNTER — Ambulatory Visit: Payer: Medicaid Other | Admitting: Physical Therapy

## 2020-01-27 ENCOUNTER — Encounter: Payer: Self-pay | Admitting: Physical Therapy

## 2020-01-27 ENCOUNTER — Other Ambulatory Visit: Payer: Self-pay

## 2020-01-27 ENCOUNTER — Ambulatory Visit: Payer: Medicaid Other | Attending: Family Medicine | Admitting: Physical Therapy

## 2020-01-27 DIAGNOSIS — M25561 Pain in right knee: Secondary | ICD-10-CM | POA: Diagnosis not present

## 2020-01-27 DIAGNOSIS — M6281 Muscle weakness (generalized): Secondary | ICD-10-CM | POA: Diagnosis present

## 2020-01-27 DIAGNOSIS — M25661 Stiffness of right knee, not elsewhere classified: Secondary | ICD-10-CM

## 2020-01-27 DIAGNOSIS — R600 Localized edema: Secondary | ICD-10-CM

## 2020-01-27 NOTE — Therapy (Addendum)
[facility] [place], [place], [address] Phone: [phone]   Fax:  [phone]  Physical Therapy Treatment/Discharge  Patient Details  Name: Joan Dawson MRN: [id number] Date of Birth: 12-Apr-1991 Referring Provider (PT): Dr. Karlton Lemon    Encounter Date: 01/27/2020   PT End of Session - 01/27/20 0721    Visit Number 6    Number of Visits 12    Date for PT Re-Evaluation 03/05/20    Authorization Type MCD - submitted for visits    PT Start Time 0715    PT Stop Time 0810    PT Time Calculation (min) 55 min           Past Medical History:  Diagnosis Date  . ADHD   . Migraines   . Scoliosis     Past Surgical History:  Procedure Laterality Date  . NO PAST SURGERIES      There were no vitals filed for this visit.   Subjective Assessment - 01/27/20 0719    Subjective Soreness after slipping down 2 steps when knee gave away.    Currently in Pain? Yes    Pain Score 2    2.5   Pain Location Knee    Pain Orientation Right;Medial;Anterior    Pain Descriptors / Indicators Aching;Tightness    Pain Type Acute pain    Aggravating Factors  work, slipping down stairs    Pain Relieving Factors ret, brace ice                             [facility] Adult PT Treatment/Exercise - 01/27/20 0001      Knee/Hip Exercises: Stretches   Active Hamstring Stretch Right;3 reps;30 seconds    Active Hamstring Stretch Limitations supine     Hip Flexor Stretch Limitations modified thomas with intermittent knee bends     Gastroc Stretch Limitations runners stretch x 2       Knee/Hip Exercises: Aerobic   Recumbent Bike 6 minutes L2      Knee/Hip Exercises: Machines for Strengthening   Total Gym Leg Press 40# bilateral leg press x 20, 20# single leg eccentric       Knee/Hip Exercises: Standing   Heel Raises 10 reps    Heel Raises Limitations 10 single leg     Lateral Step Up 15 reps;Hand Hold: 1;Step  Height: 6"    Forward Step Up 15 reps;Step Height: 6"      Knee/Hip Exercises: Seated   Long Arc Quad 20 reps    Long Arc Quad Limitations green band     Hamstring Curl 20 reps    Hamstring Limitations green     Sit to [employer] 10 reps   from standard chair without UE      Knee/Hip Exercises: Supine   Quad Sets 5 reps    Straight Leg Raises 15 reps    Straight Leg Raises Limitations cues for initial quad set, maintain knee extension.       Vasopneumatic   Number Minutes Vasopneumatic  10 minutes    Vasopnuematic Location  Knee    Vasopneumatic Pressure Medium   requested more compression    Vasopneumatic Temperature  34                    PT Short Term Goals - 01/01/20 0723      PT SHORT TERM GOAL #1   Title I with initial HEP (  01/09/2020)    Baseline increased compliance with HEP    Time 4    Period Weeks    Status Achieved      PT SHORT TERM GOAL #2   Title demo Rt knee AROM within 5 degrees of Lt ( 01/09/2020)    Time 4    Period Weeks    Status On-going      PT SHORT TERM GOAL #3   Title report =/> 50% reduction of Rt knee pain with ambulation ( 01/09/2020)    Baseline activity at work pain 4/10 after 1-2 hours.    Time 4    Period Weeks    Status Achieved      PT SHORT TERM GOAL #4   Title tolerate functional strengthing for Rt LE ( 01/09/2020)    Baseline not able to tolerate strengtheing in standing d/t pain    Time 4    Period Weeks    Status On-going             PT Long Term Goals - 12/12/19 6378      PT LONG TERM GOAL #1   Title to be set at renewal                 Plan - 01/27/20 0737    Clinical Impression Statement More c/o pain and soreness with closed chain therex and strengthening today due to recent slip down 2 stairs when knee gave away. Ended with vaso to decrease pain and inflammation.    PT Next Visit Plan LE strength and proprioception, closed chain    PT Home Exercise Plan [email or web address]: LAQ, QS SLR, heel slide, SLS, added  H/s and calf stretrch with strap           Patient will benefit from skilled therapeutic intervention in order to improve the following deficits and impairments:  Difficulty walking, Pain, Decreased strength, Decreased mobility, Decreased balance, Decreased range of motion  Visit Diagnosis: Acute pain of right knee  Stiffness of right knee, not elsewhere classified  Muscle weakness (generalized)  Localized edema     Problem List Patient Active Problem List   Diagnosis Date Noted  . Right knee pain 12/02/2019  . Encounter for postpartum care of lactating mother 10/29/2018  . Encounter for initial prescription of injectable contraceptive 10/29/2018  . Vaginal delivery 09/18/2018  . Post-dates pregnancy 09/17/2018  . Uterine contractions during pregnancy 09/17/2018  . Group beta Strep positive 08/23/2018  . Chlamydia infection affecting pregnancy, antepartum 02/20/2018  . History of migraine 02/18/2018  . Supervision of normal pregnancy, antepartum 02/15/2018    Dorene Ar, PTA 01/27/2020, 8:56 AM  [facility] [address] [place], [place], [address] Phone: [phone]   Fax:  [phone]  Name: Joan Dawson MRN: [id number] Date of Birth: May 26, 1990    PHYSICAL THERAPY DISCHARGE SUMMARY  Visits from Start of Care: 6  Current functional level related to goals / functional outcomes: See above    Remaining deficits: Unknown, was improving then stopped coming to PT>    Education / Equipment: HEP, alignment, posture  Plan: Patient agrees to discharge.  Patient goals were partially met. Patient is being discharged due to not returning since the last visit.  ?????     Raeford Razor, PT 04/09/20 11:48 AM Phone: [phone] Fax: [phone]

## 2020-02-03 ENCOUNTER — Telehealth: Payer: Self-pay | Admitting: Physical Therapy

## 2020-02-03 ENCOUNTER — Ambulatory Visit: Payer: Medicaid Other | Admitting: Physical Therapy

## 2020-02-03 NOTE — Telephone Encounter (Signed)
Attempted to contact patient regarding no show to appointment. Left voicemail asking her to call to schedule future appointments if she wants to continue with physical therapy.

## 2020-03-01 ENCOUNTER — Ambulatory Visit: Payer: Medicaid Other | Admitting: Family Medicine

## 2020-03-03 ENCOUNTER — Ambulatory Visit (INDEPENDENT_AMBULATORY_CARE_PROVIDER_SITE_OTHER): Payer: Medicaid Other | Admitting: Family Medicine

## 2020-03-03 ENCOUNTER — Other Ambulatory Visit: Payer: Self-pay

## 2020-03-03 ENCOUNTER — Encounter: Payer: Self-pay | Admitting: Family Medicine

## 2020-03-03 VITALS — BP 110/61 | Ht 69.5 in | Wt 273.0 lb

## 2020-03-03 DIAGNOSIS — M25561 Pain in right knee: Secondary | ICD-10-CM

## 2020-03-03 NOTE — Progress Notes (Signed)
PCP: Patient, No Pcp Per  Subjective:   HPI: Patient is a 29 y.o. female here for right knee pain.  9/22: Joan Dawson is a pleasant 29 year old female who presents today for follow-up due to her right knee pain after having attended several sessions of formal physical therapy.  She states overall she feels like her pain has improved and she has regained some strength however she is still having some knee pain.  The swelling has improved however she did have 2 episodes where it felt like her knee was going to lock up but it did not lock however it got very painful very quickly and she felt like she needed to sit down and move it slowly almost like it is if it was catching on something.  Pain is mostly at the lateral joint line and is responding to physical therapy but the pain will wax and wane.  11/10: Patient reports she's improved since last visit. Feels like she's over 80% improved. Did a visit of physical therapy and has been doing home exercises. Only getting intermittent soreness now with swelling of the knee. No new injuries. No catching, locking. She did have episode in past 24 hours where it felt like knee was 'out of place' but she worked it out she said.  Past Medical History:  Diagnosis Date  . ADHD   . Migraines   . Scoliosis     Current Outpatient Medications on File Prior to Visit  Medication Sig Dispense Refill  . cetirizine (ZYRTEC) 10 MG tablet Take 1 tablet (10 mg total) by mouth daily. 30 tablet 0  . fluticasone (FLONASE) 50 MCG/ACT nasal spray Place 1 spray into both nostrils daily. 16 g 2  . ibuprofen (ADVIL) 600 MG tablet Take 1 tablet (600 mg total) by mouth every 8 (eight) hours as needed for moderate pain. 30 tablet 0  . medroxyPROGESTERone (PROVERA) 10 MG tablet Take 1 tablet (10 mg total) by mouth daily for 20 days. 20 tablet 0  . meloxicam (MOBIC) 15 MG tablet Take 1 tablet (15 mg total) by mouth daily. 30 tablet 0  . [DISCONTINUED] medroxyPROGESTERone  (DEPO-PROVERA) 150 MG/ML injection Inject 1 mL (150 mg total) into the muscle every 3 (three) months. (Patient not taking: Reported on 07/08/2019) 1 mL 3   No current facility-administered medications on file prior to visit.    Past Surgical History:  Procedure Laterality Date  . NO PAST SURGERIES      No Known Allergies  Social History   Socioeconomic History  . Marital status: Single    Spouse name: Not on file  . Number of children: Not on file  . Years of education: Not on file  . Highest education level: Not on file  Occupational History  . Not on file  Tobacco Use  . Smoking status: Former Smoker    Types: Cigars    Quit date: 03/07/2018    Years since quitting: 1.9  . Smokeless tobacco: Never Used  . Tobacco comment: BLACK N MILD  Vaping Use  . Vaping Use: Never used  Substance and Sexual Activity  . Alcohol use: Yes    Comment: OCC.  . Drug use: No  . Sexual activity: Not Currently    Birth control/protection: Injection  Other Topics Concern  . Not on file  Social History Narrative  . Not on file   Social Determinants of Health   Financial Resource Strain:   . Difficulty of Paying Living Expenses: Not on file  Food Insecurity:   . Worried About Programme researcher, broadcasting/film/video in the Last Year: Not on file  . Ran Out of Food in the Last Year: Not on file  Transportation Needs:   . Lack of Transportation (Medical): Not on file  . Lack of Transportation (Non-Medical): Not on file  Physical Activity:   . Days of Exercise per Week: Not on file  . Minutes of Exercise per Session: Not on file  Stress:   . Feeling of Stress : Not on file  Social Connections:   . Frequency of Communication with Friends and Family: Not on file  . Frequency of Social Gatherings with Friends and Family: Not on file  . Attends Religious Services: Not on file  . Active Member of Clubs or Organizations: Not on file  . Attends Banker Meetings: Not on file  . Marital Status: Not  on file  Intimate Partner Violence:   . Fear of Current or Ex-Partner: Not on file  . Emotionally Abused: Not on file  . Physically Abused: Not on file  . Sexually Abused: Not on file    Family History  Problem Relation Age of Onset  . Diabetes Mother   . Uterine cancer Mother   . Hypertension Mother   . Crohn's disease Sister   . Hypertension Sister   . Hypertension Brother     BP 110/61   Ht 5' 9.5" (1.765 m)   Wt 273 lb (123.8 kg)   BMI 39.74 kg/m   [facility] Adult Exercise 01/14/2020 03/03/2020  Frequency of aerobic exercise (# of days/week) 0 0  Average time in minutes 0 0  Frequency of strengthening activities (# of days/week) 0 0    No flowsheet data found.  Review of Systems: See HPI above.     Objective:  Physical Exam:  Gen: NAD, comfortable in exam room  Right knee: No gross deformity, ecchymoses, effusion. No TTP currently. FROM with normal strength. Negative ant/post drawers. Negative valgus/varus testing. Negative lachmans. Negative mcmurrays, apleys, patellar apprehension, thessalys. NV intact distally.   Assessment & Plan:  1. Right knee pain - consistent with hamstring strain and possible meniscal tear that are both healing well.  Exam reassuring.  Quad strengthening.  Knee brace when up and walking.  Icing, tylenol if needed.  F/u in 6 weeks or prn.

## 2020-03-03 NOTE — Patient Instructions (Signed)
I'm glad you're doing better! Focus on quad strengthening exercises. Continue wearing the knee brace for the next 6 weeks when you're on your feet a lot. Icing, tylenol if needed. Follow up with me in 6 weeks or as needed if you're doing well.

## 2020-08-23 IMAGING — US US MFM OB COMP +14 WKS
1 series · 13 of 28 positions shown · non-contrast
Comparison: none

[Series 1: us mfm ob comp +14 wks · 80 acquisitions, 13 frames shown]
[im 3/80]
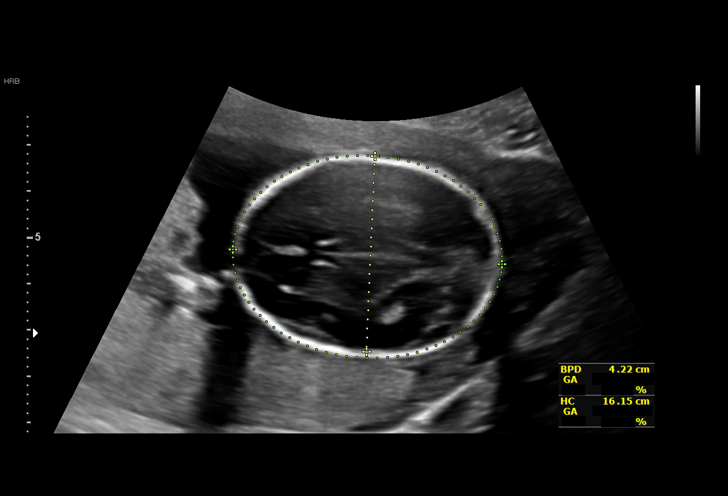
[im 9/80]
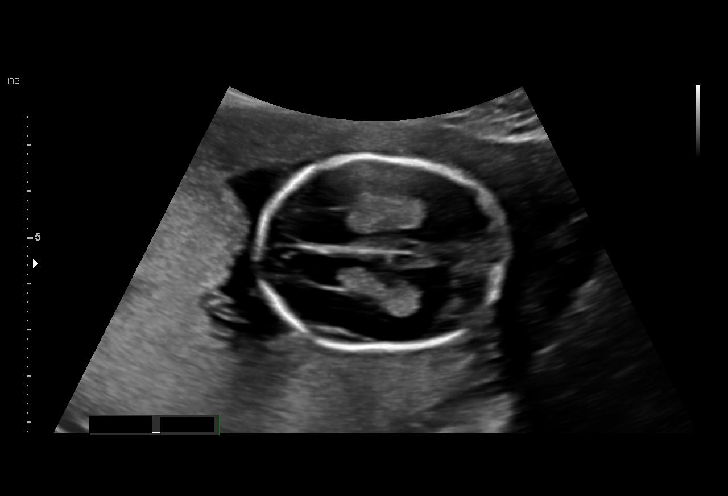
[im 15/80]
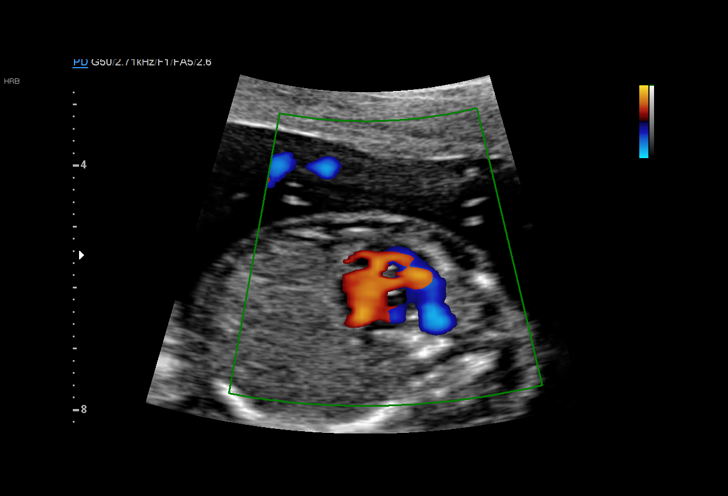
[im 21/80]
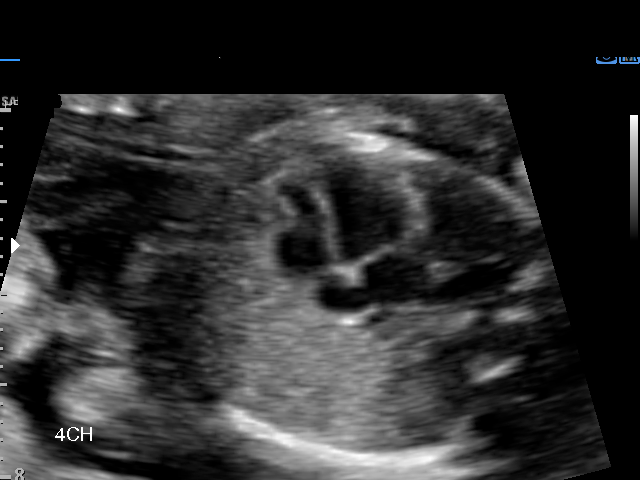
[im 27/80]
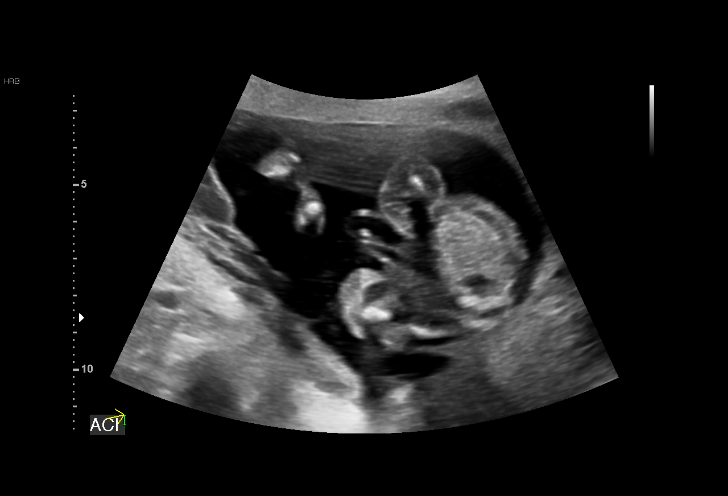
[im 33/80]
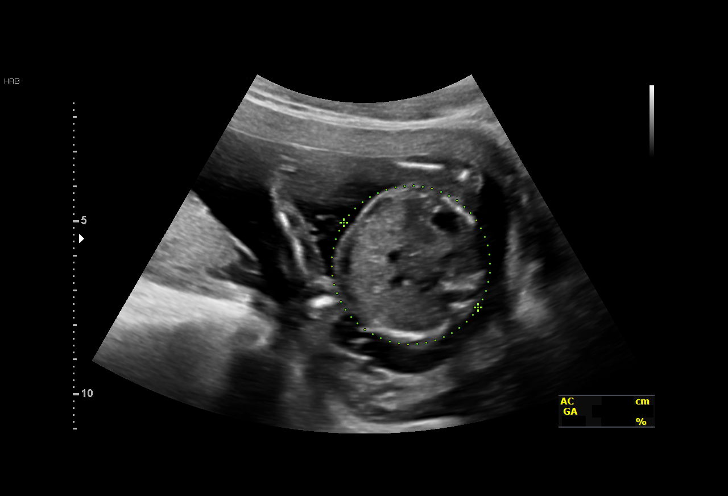
[im 41/80]
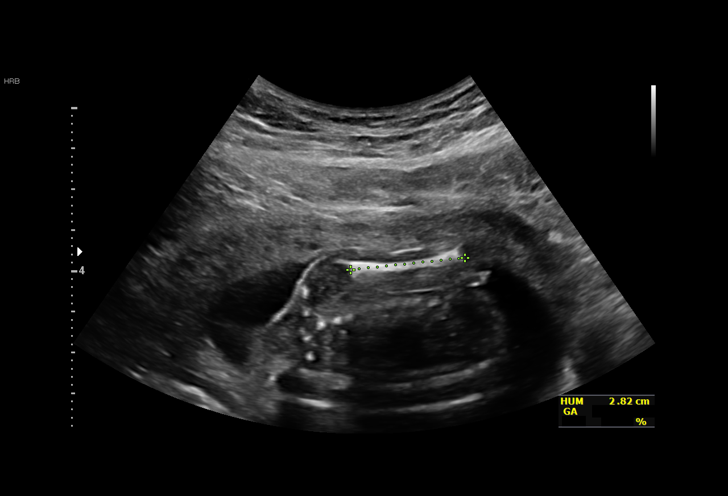
[im 47/80]
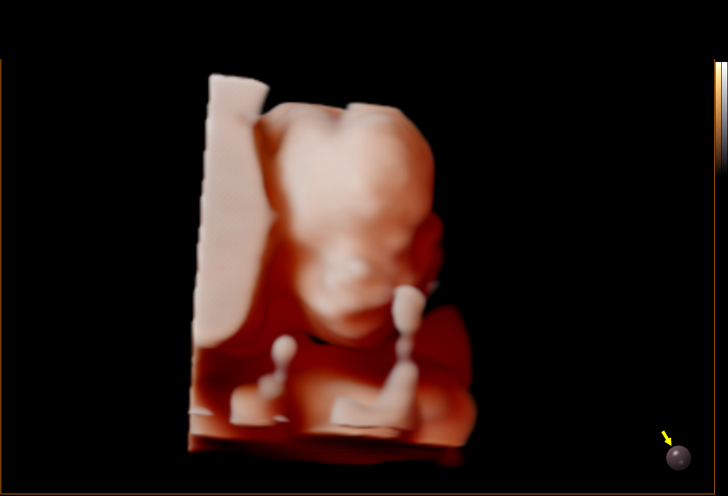
[im 53/80]
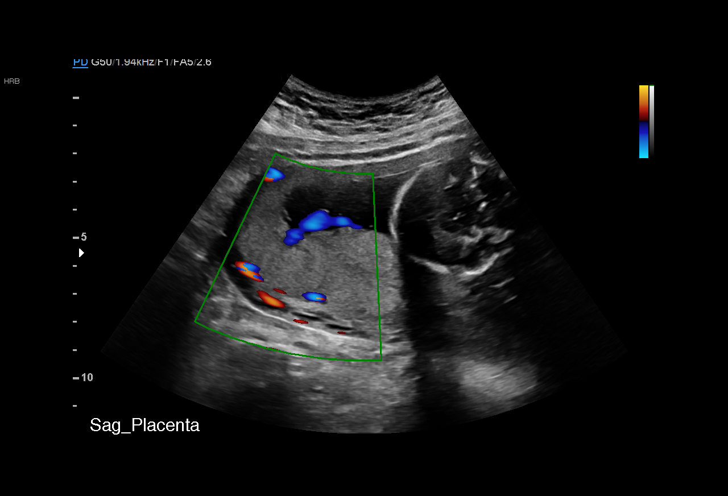
[im 59/80]
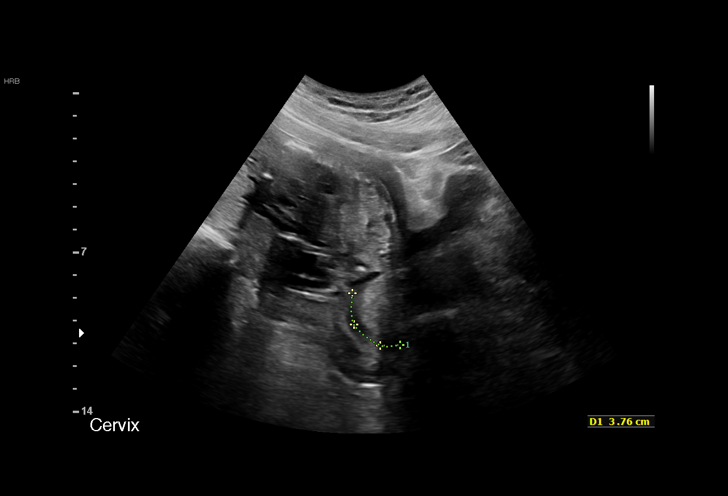
[im 65/80]
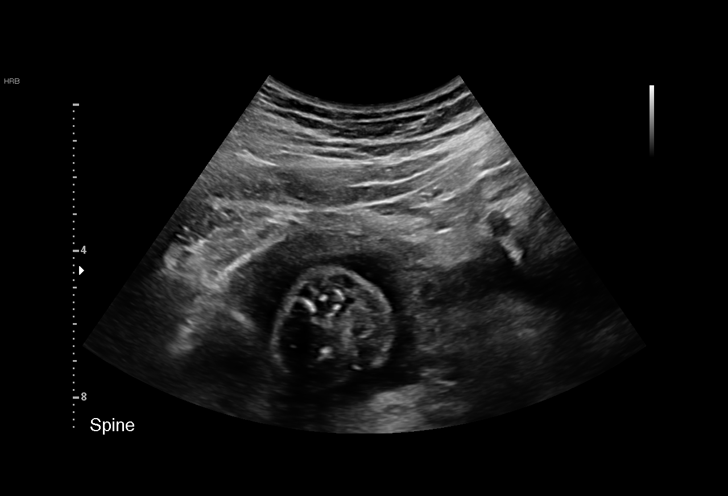
[im 71/80]
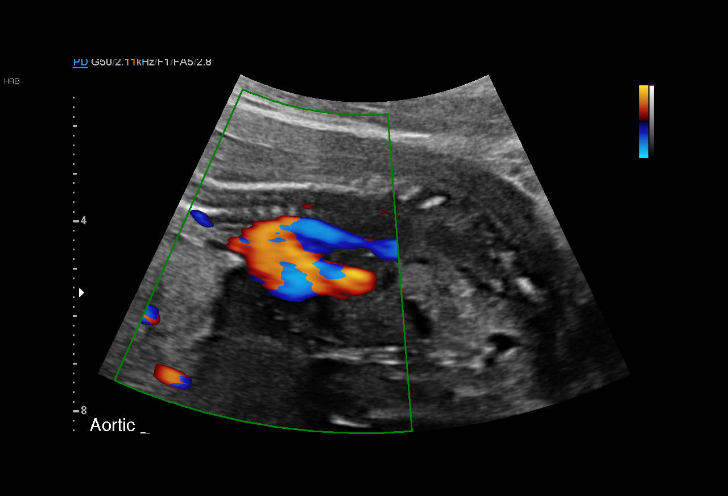
[im 77/80]
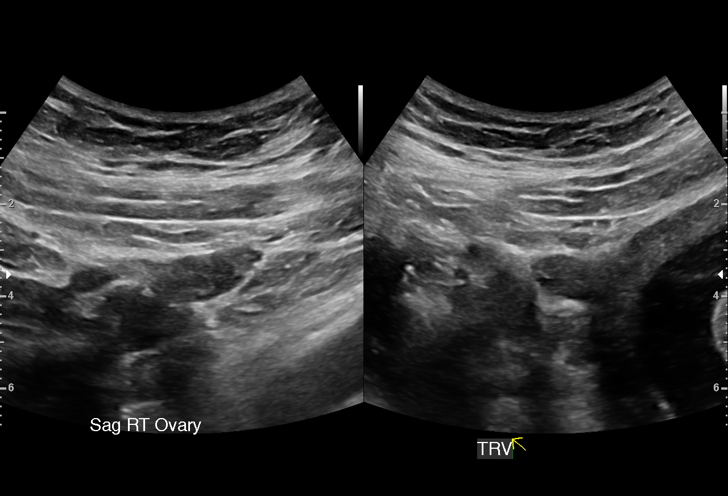

[13 of 28 positions shown; findings below may reference images not displayed]

1  US MFM OB COMP + 14 WK               76805.01     NANETTE ROGEL
 ----------------------------------------------------------------------

 ----------------------------------------------------------------------
Indications

  Encounter for antenatal screening for
  malformations (low risk panorama, neg AFP,
  and CF)
  19 weeks gestation of pregnancy
 ----------------------------------------------------------------------
Vital Signs

 BMI:
Fetal Evaluation

 Num Of Fetuses:         1
 Cardiac Activity:       Observed
 Presentation:           Variable
 Placenta:               Posterior Fundal
 P. Cord Insertion:      Visualized, central

 Amniotic Fluid
 AFI FV:      Within normal limits

                             Largest Pocket(cm)

Biometry

 BPD:      42.2  mm     G. Age:  18w 5d         41  %    CI:        71.36   %    70 - 86
                                                         FL/HC:      17.5   %    16.1 -
 HC:      159.1  mm     G. Age:  18w 6d         31  %    HC/AC:      1.09        1.09 -
 AC:      146.5  mm     G. Age:  20w 0d         76  %    FL/BPD:     65.9   %
 FL:       27.8  mm     G. Age:  18w 4d         26  %    FL/AC:      19.0   %    20 - 24
 HUM:      28.2  mm     G. Age:  19w 1d         52  %
 CER:      19.8  mm     G. Age:  18w 6d         47  %
 NFT:       2.6  mm
 LV:        5.2  mm
 CM:        4.7  mm

 Est. FW:     280  gm    0 lb 10 oz      49  %
OB History

 Gravidity:    1
Gestational Age

 LMP:           19w 0d        Date:  12/03/17                 EDD:   09/09/18
 U/S Today:     19w 0d                                        EDD:   09/09/18
 Best:          19w 0d     Det. By:  LMP  (12/03/17)          EDD:   09/09/18
Anatomy

 Cranium:               Appears normal         Aortic Arch:            Appears normal
 Cavum:                 Appears normal         Ductal Arch:            Appears normal
 Ventricles:            Appears normal         Diaphragm:              Appears normal
 Choroid Plexus:        Appears normal         Stomach:                Appears normal, left
                                                                       sided
 Cerebellum:            Appears normal         Abdomen:                Appears normal
 Posterior Fossa:       Appears normal         Abdominal Wall:         Appears nml (cord
                                                                       insert, abd wall)
 Nuchal Fold:           Appears normal         Cord Vessels:           Appears normal (3
                                                                       vessel cord)
 Face:                  Appears normal         Kidneys:                Appear normal
                        (orbits and profile)
 Lips:                  Appears normal         Bladder:                Appears normal
 Thoracic:              Appears normal         Spine:                  Appears normal
 Heart:                 Appears normal         Upper Extremities:      Appears normal
                        (4CH, axis, and
                        situs)
 RVOT:                  Appears normal         Lower Extremities:      Appears normal
 LVOT:                  Appears normal

 Other:  Male gender. Heels visualized. Nasal bone visualized.
Cervix Uterus Adnexa

 Cervix
 Length:           3.76  cm.
 Not visualized (advanced GA >11wks)

 Uterus
 No abnormality visualized.

 Left Ovary
 Within normal limits.

 Right Ovary
 Within normal limits.

 Cul De Sac
 No free fluid seen.

 Adnexa
 No abnormality visualized.
Impression

 We performed fetal anatomy scan. No makers of
 aneuploidies or fetal structural defects are seen. Fetal
 biometry is consistent with her previously-established dates.
 Amniotic fluid is normal and good fetal activity is seen.

 On cell-free fetal DNA screening, the risks of fetal
 aneuploidies are not increased. MSAFP screening showed
 low risk for open-neural tube defects.
Recommendations

 Follow-up as clinically indicated.
                 Damijan, Gina-Tomo

## 2020-09-29 ENCOUNTER — Other Ambulatory Visit: Payer: Self-pay

## 2020-09-29 ENCOUNTER — Ambulatory Visit (INDEPENDENT_AMBULATORY_CARE_PROVIDER_SITE_OTHER): Payer: Medicaid Other

## 2020-09-29 DIAGNOSIS — N912 Amenorrhea, unspecified: Secondary | ICD-10-CM

## 2020-09-29 LAB — POCT URINE PREGNANCY: Preg Test, Ur: POSITIVE — AB

## 2020-09-29 MED ORDER — VITAFOL GUMMIES 3.33-0.333-34.8 MG PO CHEW: 3.0000 | CHEWABLE_TABLET | Freq: Every day | ORAL | 12 refills | Status: DC

## 2020-09-29 NOTE — Progress Notes (Signed)
..   Ms. [place] presents today for UPT. She has no unusual complaints. LMP:08-25-20    OBJECTIVE: Appears well, in no apparent distress.  OB History    Gravida  2   Para  1   Term  1   Preterm      AB      Living  1     SAB      IAB      Ectopic      Multiple  0   Live Births  1          Home UPT Result:Positive In-Office UPT result:Positive I have reviewed the patient's medical, obstetrical, social, and family histories, and medications.   ASSESSMENT: Positive pregnancy test  PLAN Prenatal care to be completed at: [facility]

## 2020-09-29 NOTE — Progress Notes (Signed)
Chart reviewed for nurse visit. Agree with plan of care.   Shequila Neglia M, MD 09/29/20 2:53 PM 

## 2020-10-27 ENCOUNTER — Ambulatory Visit (INDEPENDENT_AMBULATORY_CARE_PROVIDER_SITE_OTHER): Payer: Medicaid Other

## 2020-10-27 ENCOUNTER — Other Ambulatory Visit: Payer: Self-pay

## 2020-10-27 VITALS — BP 129/81 | HR 73 | Ht 69.0 in | Wt 296.0 lb

## 2020-10-27 DIAGNOSIS — Z3A09 9 weeks gestation of pregnancy: Secondary | ICD-10-CM

## 2020-10-27 DIAGNOSIS — Z3481 Encounter for supervision of other normal pregnancy, first trimester: Secondary | ICD-10-CM

## 2020-10-27 DIAGNOSIS — Z3491 Encounter for supervision of normal pregnancy, unspecified, first trimester: Secondary | ICD-10-CM | POA: Insufficient documentation

## 2020-10-27 DIAGNOSIS — O3680X0 Pregnancy with inconclusive fetal viability, not applicable or unspecified: Secondary | ICD-10-CM

## 2020-10-27 DIAGNOSIS — Z3480 Encounter for supervision of other normal pregnancy, unspecified trimester: Secondary | ICD-10-CM | POA: Insufficient documentation

## 2020-10-27 MED ORDER — BLOOD PRESSURE KIT DEVI: 1.0000 | 0 refills | Status: DC

## 2020-10-27 NOTE — Progress Notes (Signed)
New OB Intake  I connected with  Joan Dawson on 10/27/20 at  8:15 AM EDT by In person Video Visit and verified that I am speaking with the correct person using two identifiers. Nurse is located at [facility] and pt is located at [place].  I discussed the limitations, risks, security and privacy concerns of performing an evaluation and management service by telephone and the availability of in person appointments. I also discussed with the patient that there may be a patient responsible charge related to this service. The patient expressed understanding and agreed to proceed.  I explained I am completing New OB Intake today. We discussed her EDD of 06/01/20 that is based on LMP of 08/25/20. Pt is G2/P1001. I reviewed her allergies, medications, Medical/Surgical/OB history, and appropriate screenings. I informed her of [facility] services. Based on history, this is a/an  pregnancy uncomplicated .   Patient Active Problem List   Diagnosis Date Noted   Right knee pain 12/02/2019   Encounter for postpartum care of lactating mother 10/29/2018   Encounter for initial prescription of injectable contraceptive 10/29/2018   Vaginal delivery 09/18/2018   Post-dates pregnancy 09/17/2018   Uterine contractions during pregnancy 09/17/2018   Group beta Strep positive 08/23/2018   Chlamydia infection affecting pregnancy, antepartum 02/20/2018   History of migraine 02/18/2018   Supervision of normal pregnancy, antepartum 02/15/2018    Concerns addressed today  Delivery Plans:  Plans to deliver at [facility].   MyChart/Babyscripts MyChart access verified. I explained pt will have some visits in office and some virtually. Babyscripts instructions given and order placed. Patient verifies receipt of registration text/e-mail. Account successfully created and app downloaded.  Blood Pressure Cuff  Blood pressure cuff ordered for patient to pick-up from [employer]. Explained after first prenatal appt pt will check  weekly and document in Babyscripts.  Weight scale: Patient does have weight scale.   Anatomy US Explained first scheduled [place] will be around 19 weeks. Dating and viability scan performed today  Labs Discussed Avelina Laine genetic screening with patient. Would like both Panorama and Horizon drawn at new OB visit. Routine prenatal labs needed.  Covid Vaccine Patient has not covid vaccine.    Inform patient of [facility] . In AVS   Social Determinants of Health Food Insecurity: Patient denies food insecurity. WIC Referral: Patient is interested in referral to [facility].  Transportation: Patient denies transportation needs. Childcare: Discussed no children allowed at ultrasound appointments. Offered childcare services; patient declines childcare services at this time.  First visit review I reviewed new OB appt with pt. I explained she will have a pelvic exam, ob bloodwork with genetic screening, and PAP smear. Explained pt will be seen by Donia Ast at first visit; encounter routed to appropriate provider. Explained that patient will be seen by pregnancy navigator following visit with provider. [facility] information placed in AVS.   Hamilton Capri, RN 10/27/2020  8:21 AM

## 2020-10-28 NOTE — Progress Notes (Signed)
Patient was assessed and managed by nursing staff during this encounter. I have reviewed the chart and agree with the documentation and plan. I have also made any necessary editorial changes.  Catalina Antigua, MD 10/28/2020 10:17 AM

## 2020-11-03 ENCOUNTER — Other Ambulatory Visit (HOSPITAL_COMMUNITY)
Admission: RE | Admit: 2020-11-03 | Discharge: 2020-11-03 | Disposition: A | Discharge status: Home / Self Care | Payer: Medicaid Other | Source: Ambulatory Visit | Attending: Women's Health | Admitting: Women's Health

## 2020-11-03 ENCOUNTER — Ambulatory Visit (INDEPENDENT_AMBULATORY_CARE_PROVIDER_SITE_OTHER): Payer: Medicaid Other | Admitting: Women's Health

## 2020-11-03 ENCOUNTER — Other Ambulatory Visit: Payer: Self-pay

## 2020-11-03 VITALS — BP 127/83 | HR 81 | Wt 294.0 lb

## 2020-11-03 DIAGNOSIS — Z3481 Encounter for supervision of other normal pregnancy, first trimester: Secondary | ICD-10-CM | POA: Diagnosis present

## 2020-11-03 DIAGNOSIS — O99210 Obesity complicating pregnancy, unspecified trimester: Secondary | ICD-10-CM

## 2020-11-03 DIAGNOSIS — Z3A10 10 weeks gestation of pregnancy: Secondary | ICD-10-CM | POA: Diagnosis not present

## 2020-11-03 LAB — HEPATITIS C ANTIBODY: HCV Ab: NEGATIVE

## 2020-11-03 MED ORDER — ASPIRIN EC 81 MG PO TBEC: 81.0000 mg | DELAYED_RELEASE_TABLET | Freq: Every day | ORAL | 6 refills | Status: DC

## 2020-11-03 NOTE — Patient Instructions (Signed)
Maternity Assessment Unit (MAU)  The Maternity Assessment Unit (MAU) is located at the [facility] at [facility]. The address is: [address], [place], [place], [place] [address]. Please see map below for additional directions.    The Maternity Assessment Unit is designed to help you during your pregnancy, and for up to 6 weeks after delivery, with any pregnancy- or postpartum-related emergencies, if you think you are in labor, or if your water has broken. For example, if you experience nausea and vomiting, vaginal bleeding, severe abdominal or pelvic pain, elevated blood pressure or other problems related to your pregnancy or postpartum time, please come to the Maternity Assessment Unit for assistance.      Obstetrics: Normal and Problem Pregnancies (7th ed., pp. 102-121). [place], [place]: Elsevier."> Textbook of Family Medicine (9th ed., pp. [phone]). [place], [place]: Elsevier [employer].">  First Trimester of Pregnancy  The first trimester of pregnancy starts on the first day of your last menstrual period until the end of week 12. This is months 1 through 3 of pregnancy. A week after a sperm fertilizes an egg, the egg will implant into the wall of the uterus and begin to develop into a baby. By the end of 12 weeks, all the baby'sorgans will be formed and the baby will be 2-3 inches in size. Body changes during your first trimester Your body goes through many changes during pregnancy. The changes vary andgenerally return to normal after your baby is born. Physical changes You may gain or lose weight. Your breasts may begin to grow larger and become tender. The tissue that surrounds your nipples (areola) may become darker. Dark spots or blotches (chloasma or mask of pregnancy) may develop on your face. You may have changes in your hair. These can include thickening or thinning of your hair or changes in texture. Health changes You may feel nauseous, and  you may vomit. You may have heartburn. You may develop headaches. You may develop constipation. Your gums may bleed and may be sensitive to brushing and flossing. Other changes You may tire easily. You may urinate more often. Your menstrual periods will stop. You may have a loss of appetite. You may develop cravings for certain kinds of food. You may have changes in your emotions from day to day. You may have more vivid and strange dreams. Follow these instructions at home: Medicines Follow your health care provider's instructions regarding medicine use. Specific medicines may be either safe or unsafe to take during pregnancy. Do not take any medicines unless told to by your health care provider. Take a prenatal vitamin that contains at least 600 micrograms (mcg) of folic acid. Eating and drinking Eat a healthy diet that includes fresh fruits and vegetables, whole grains, good sources of protein such as meat, eggs, or tofu, and low-fat dairy products. Avoid raw meat and unpasteurized juice, milk, and cheese. These carry germs that can harm you and your baby. If you feel nauseous or you vomit: Eat 4 or 5 small meals a day instead of 3 large meals. Try eating a few soda crackers. Drink liquids between meals instead of during meals. You may need to take these actions to prevent or treat constipation: Drink enough fluid to keep your urine pale yellow. Eat foods that are high in fiber, such as beans, whole grains, and fresh fruits and vegetables. Limit foods that are high in fat and processed sugars, such as fried or sweet foods. Activity Exercise only as directed by  your health care provider. Most people can continue their usual exercise routine during pregnancy. Try to exercise for 30 minutes at least 5 days a week. Stop exercising if you develop pain or cramping in the lower abdomen or lower back. Avoid exercising if it is very hot or humid or if you are at high altitude. Avoid heavy  lifting. If you choose to, you may have sex unless your health care provider tells you not to. Relieving pain and discomfort Wear a good support bra to relieve breast tenderness. Rest with your legs elevated if you have leg cramps or low back pain. If you develop bulging veins (varicose veins) in your legs: Wear support hose as told by your health care provider. Elevate your feet for 15 minutes, 3-4 times a day. Limit salt in your diet. Safety Wear your seat belt at all times when driving or riding in a car. Talk with your health care provider if someone is verbally or physically abusive to you. Talk with your health care provider if you are feeling sad or have thoughts of hurting yourself. Lifestyle Do not use hot tubs, steam rooms, or saunas. Do not douche. Do not use tampons or scented sanitary pads. Do not use herbal remedies, alcohol, illegal drugs, or medicines that are not approved by your health care provider. Chemicals in these products can harm your baby. Do not use any products that contain nicotine or tobacco, such as cigarettes, e-cigarettes, and chewing tobacco. If you need help quitting, ask your health care provider. Avoid cat litter boxes and soil used by cats. These carry germs that can cause birth defects in the baby and possibly loss of the unborn baby (fetus) by miscarriage or stillbirth. General instructions During routine prenatal visits in the first trimester, your health care provider will do a physical exam, perform necessary tests, and ask you how things are going. Keep all follow-up visits. This is important. Ask for help if you have counseling or nutritional needs during pregnancy. Your health care provider can offer advice or refer you to specialists for help with various needs. Schedule a dentist appointment. At home, brush your teeth with a soft toothbrush. Floss gently. Write down your questions. Take them to your prenatal visits. Where to find more  information American Pregnancy Association: americanpregnancy.org [employer] of Obstetricians and Gynecologists: https://www.todd-brady.net/ Office on [employer] Health: MightyReward.co.nz Contact a health care provider if you have: Dizziness. A fever. Mild pelvic cramps, pelvic pressure, or nagging pain in the abdominal area. Nausea, vomiting, or diarrhea that lasts for 24 hours or longer. A bad-smelling vaginal discharge. Pain when you urinate. Known exposure to a contagious illness, such as chickenpox, measles, Zika virus, HIV, or hepatitis. Get help right away if you have: Spotting or bleeding from your vagina. Severe abdominal cramping or pain. Shortness of breath or chest pain. Any kind of trauma, such as from a fall or a car crash. New or increased pain, swelling, or redness in an arm or leg. Summary The first trimester of pregnancy starts on the first day of your last menstrual period until the end of week 12 (months 1 through 3). Eating 4 or 5 small meals a day rather than 3 large meals may help to relieve nausea and vomiting. Do not use any products that contain nicotine or tobacco, such as cigarettes, e-cigarettes, and chewing tobacco. If you need help quitting, ask your health care provider. Keep all follow-up visits. This is important. This information is not intended to replace advice  given to you by your health care provider. Make sure you discuss any questions you have with your healthcare provider. Document Revised: 09/17/2019 Document Reviewed: 07/24/2019 Elsevier Patient Education  2022 Elsevier Inc.                        Safe Medications in Pregnancy    Acne: Benzoyl Peroxide Salicylic Acid  Backache/Headache: Tylenol: 2 regular strength every 4 hours OR              2 Extra strength every 6 hours  Colds/Coughs/Allergies: Benadryl (alcohol free) 25 mg every 6 hours as needed Breath right strips Claritin Cepacol throat  lozenges Chloraseptic throat spray Cold-Eeze- up to three times per day Cough drops, alcohol free Flonase (by prescription only) Guaifenesin Mucinex Robitussin DM (plain only, alcohol free) Saline nasal spray/drops Sudafed (pseudoephedrine) & Actifed ** use only after [redacted] weeks gestation and if you do not have high blood pressure Tylenol Vicks Vaporub Zinc lozenges Zyrtec   Constipation: Colace Ducolax suppositories Fleet enema Glycerin suppositories Metamucil Milk of magnesia Miralax Senokot Smooth move tea  Diarrhea: Kaopectate Imodium A-D  *NO pepto Bismol  Hemorrhoids: Anusol Anusol HC Preparation H Tucks  Indigestion: Tums Maalox Mylanta Zantac  Pepcid  Insomnia: Benadryl (alcohol free) 25mg  every 6 hours as needed Tylenol PM Unisom, no Gelcaps  Leg Cramps: Tums MagGel  Nausea/Vomiting:  Bonine Dramamine Emetrol Ginger extract Sea bands Meclizine  Nausea medication to take during pregnancy:  Unisom (doxylamine succinate 25 mg tablets) Take one tablet daily at bedtime. If symptoms are not adequately controlled, the dose can be increased to a maximum recommended dose of two tablets daily (1/2 tablet in the morning, 1/2 tablet mid-afternoon and one at bedtime). Vitamin B6 100mg  tablets. Take one tablet twice a day (up to 200 mg per day).  Skin Rashes: Aveeno products Benadryl cream or 25mg  every 6 hours as needed Calamine Lotion 1% cortisone cream  Yeast infection: Gyne-lotrimin 7 Monistat 7   **If taking multiple medications, please check labels to avoid duplicating the same active ingredients **take medication as directed on the label ** Do not exceed 4000 mg of tylenol in 24 hours **Do not take medications that contain aspirin or ibuprofen         https://www.ncbi.nlm.nih.gov/books/NBK532888/">  Prelabor Rupture and Preterm Prelabor Rupture of Membranes  Rupture of membranes is when the sac of fluid that surrounds a baby  in the uterus (amniotic sac) breaks open. This is commonly referred to as your water breaking. If your water breaks before labor starts (prematurely), it is called prelabor rupture of membranes (PROM). If PROM occurs before 37weeks of pregnancy, it is called preterm prelabor rupture of membranes (PPROM). Because the amniotic sac keeps infection out and performs other important functions, having the amniotic sac rupture before 37 weeks of pregnancy can lead to serious problems for the mother and the baby. These include an increased risk of: Needing to have a cesarean delivery, or C-section. A serious infection for both the mother and the baby. Respiratory distress syndrome in the baby. The baby's lungs not developing as much as normal. Bleeding in the baby's brain. The baby dying. PPROM requires immediate attention from a health care provider. What are the causes? When PROM occurs at 37 weeks of pregnancy or later, it is usually caused bynatural weakening of the membranes and friction caused by contractions. PPROM is usually caused by infection. In many cases, the cause is not known. What increases the  risk? The following factors may make you more likely to have PPROM: Infection. Having had PPROM in a previous pregnancy. Having a short cervix. Bleeding during the second or third trimester. Low body mass index (BMI), which is an estimate of body fat. Smoking or using drugs. What are the signs or symptoms? Signs of PROM and PPROM include: A sudden gush or slow leaking of fluid from the vagina. Constant wet underwear. Sometimes, women mistake the leaking or wetness for urine, especially if the leak is slow and not a gush of fluid. If there is constant leaking or if yourunderwear continues to get wet, your membranes have likely ruptured. How is this diagnosed? This condition may be diagnosed based on: Your symptoms and medical history. A physical exam. This will include a cervical exam that is  done using a lubricated instrument (speculum) to check whether the cervix has softened or started to open (dilate) and to look for amniotic fluid in the vagina. Tests to check for the presence of amniotic fluid in the vagina. How is this treated? Treatment will depend on many factors, such as how many weeks you have been pregnant (how far along you are), the development of the baby, and other complications that may occur. Treatment may include: Taking steps to cause you to start the labor process (labor induction). This may be done if: You have PROM. In this case, labor may be induced within 24 hours if you are not having contractions. You have PPROM that happens after you have reached the 34th week of pregnancy. Labor may be induced if you are not having contractions. You have PPROM that happens before the 34th week of pregnancy and complications occur for you or the baby. Monitoring you and the baby closely for signs of infection or other complications. Medicines, such as: An antibiotic to lower the chances of developing an infection. A steroid to help mature the baby's lungs more quickly. A medicine to help prevent cerebral palsy in your baby. Follow these instructions at home: You may need to stay in the hospital for treatment and monitoring. If you are allowed to go home, follow instructions from your health care provider. Make sure you: Rest as told by your health care provider. Take over-the-counter and prescription medicines only as told by your health care provider. Do not use any products that contain nicotine or tobacco, such as cigarettes, e-cigarettes, and chewing tobacco. If you need help quitting, ask your health care provider. Do not drink alcohol. Return to the hospital as told by your health care provider. Contact a health care provider if: You have a sudden gush or slow leaking of fluid from the vagina after 37 weeks of pregnancy. You have constant wet underwear after 37  weeks of pregnancy. Get help right away if: Your amniotic sac ruptures before 37 weeks of pregnancy. You are being monitored at home after PROM or PPROM and you develop signs of an infection, such as fever, chills, body aches, or abdominal pain. You have signs of labor, such as abdominal pain or menstrual-like cramping. You have vaginal bleeding. The color of your vaginal fluid changes from clear to green, brown, or bloody. You see the umbilical cord sticking out from your vagina or you feel the cord in your vagina. Summary Rupture of membranes is when the sac of fluid that surrounds a baby in the uterus (amniotic sac) breaks open. If rupture of membranes happens after 37 weeks of pregnancy and labor has not started, it is called  PROM. If it happens before 37 weeks of pregnancy, it is called PPROM. PPROM causes an increased risk of some serious problems for you and your baby. Follow your health care provider's instructions for when to seek help. This information is not intended to replace advice given to you by your health care provider. Make sure you discuss any questions you have with your healthcare provider. Document Revised: 05/01/2019 Document Reviewed: 05/01/2019 Elsevier Patient Education  2022 [employer].

## 2020-11-03 NOTE — Progress Notes (Signed)
History:   Joan Dawson is a 30 y.o. G2P1001 at 24w0dby LMP being seen today for her first obstetrical visit.  Her obstetrical history is significant for obesity. Patient does intend to breast feed. Pregnancy history fully reviewed.  Allergies: NKDA Current Medications: PNV PMH: none. No HTN, DM, asthma. PSH: none OB Hx: 2020 (full-term, NSVD) Social Hx: pt does not smoke, drink, or use drugs. Family Hx: none  Patient reports no complaints.      HISTORY: OB History  Gravida Para Term Preterm AB Living  _0 0 0 1  SAB IAB Ectopic Multiple Live Births  0 0 0 0 1    # Outcome Date GA Lbr Len/2nd Weight Sex Delivery Anes PTL Lv  2 Current           1 Term 09/18/18 417w2d1:45 / 00:30 9 lb 4.3 oz (4.204 kg) M Vag-Spont Local  LIV     Birth Comments: umbilical hernia     Name: Kuntzman,BOY Haasini     Apgar1: 8  Apgar5: 9    Last pap smear was done 06/2019 and was abnormal - NILM, +hrHPV  Past Medical History:  Diagnosis Date   ADHD    Migraines    Scoliosis    Past Surgical History:  Procedure Laterality Date   NO PAST SURGERIES     Family History  Problem Relation Age of Onset   Diabetes Mother    Uterine cancer Mother    Hypertension Mother    Crohn's disease Sister    Hypertension Sister    Hypertension Brother    Social History   Tobacco Use   Smoking status: Former    Pack years: 0.00    Types: Cigars    Quit date: 06/2020    Years since quitting: 0.3   Smokeless tobacco: Never   Tobacco comments:    BLACK N MILD  Vaping Use   Vaping Use: Former   Quit date: 07/06/2020  Substance Use Topics   Alcohol use: Not Currently    Comment: not since confirmed pregnancy   Drug use: No   No Known Allergies Current Outpatient Medications on File Prior to Visit  Medication Sig Dispense Refill   Blood Pressure Monitoring (BLOOD PRESSURE KIT) DEVI 1 kit by Does not apply route once a week. 1 each 0   Prenatal Vit-Fe Phos-FA-Omega (VITAFOL GUMMIES)  3.33-0.333-34.8 MG CHEW Chew 3 tablets by mouth daily. 90 tablet 12   [DISCONTINUED] medroxyPROGESTERone (DEPO-PROVERA) 150 MG/ML injection Inject 1 mL (150 mg total) into the muscle every 3 (three) months. (Patient not taking: Reported on 07/08/2019) 1 mL 3   No current facility-administered medications on file prior to visit.    Review of Systems Pertinent items noted in HPI and remainder of comprehensive ROS otherwise negative.  Physical Exam:   Vitals:   11/03/20 0927  BP: 127/83  Pulse: 81  Weight: 294 lb (133.4 kg)     Bedside Ultrasound for FHR check: Viable intrauterine pregnancy with positive cardiac activity noted, fetal heart rate 150bpm Patient informed that the ultrasound is considered a limited obstetric ultrasound and is not intended to be a complete ultrasound exam.  Patient also informed that the ultrasound is not being completed with the intent of assessing for fetal or placental anomalies or any pelvic abnormalities.  Explained that the purpose of today's ultrasound is to assess for fetal heart rate.  Patient acknowledges the purpose of the exam and the limitations of the study.  General: well-developed, well-nourished female in no acute distress  Breasts:  normal appearance, no masses or tenderness bilaterally  Skin: normal coloration and turgor, no rashes  Neurologic: oriented, normal, negative, normal mood  Extremities: normal strength, tone, and muscle mass, ROM of all joints is normal  HEENT PERRLA, extraocular movement intact and sclera clear, anicteric  Neck supple and no masses  Cardiovascular: regular rate and rhythm  Respiratory:  no respiratory distress, normal breath sounds  Abdomen: soft, non-tender; bowel sounds normal; no masses,  no organomegaly  Pelvic: normal external genitalia, no lesions, normal vaginal mucosa, normal vaginal discharge, normal cervix, pap smear done.    Assessment:    Pregnancy: G2P1001 Patient Active Problem List   Diagnosis  Date Noted   Obesity in pregnancy 11/03/2020   Encounter for supervision of normal pregnancy in first trimester 10/27/2020   Right knee pain 12/02/2019   History of migraine 02/18/2018     Plan:    1. Encounter for supervision of other normal pregnancy in first trimester - Culture, OB Urine - Genetic Screening - Obstetric Panel, Including HIV - Hepatitis C Antibody - [place] MFM OB DETAIL +14 WK; Future - Cytology - PAP( [place])  2. [redacted] weeks gestation of pregnancy  3. Obesity in pregnancy - Comp Met (CMET) - Protein / creatinine ratio, urine - HgB A1c - [place] MFM OB DETAIL +14 WK; Future - aspirin EC 81 MG tablet; Take 1 tablet (81 mg total) by mouth daily. Swallow whole.  Dispense: 30 tablet; Refill: 6  Initial labs drawn. Continue prenatal vitamins. Problem list reviewed and updated. Genetic Screening discussed, NIPS: requested. Ultrasound discussed; fetal anatomic survey: ordered. Anticipatory guidance about prenatal visits given including labs, ultrasounds, and testing. Discussed usage of Babyscripts and virtual visits as additional source of managing and completing prenatal visits in midst of coronavirus and pandemic.   Encouraged to complete MyChart Registration for her ability to review results, send requests, and have questions addressed.  The nature of [facility] with multiple MDs and Advanced Practice Providers was explained to patient; also emphasized that residents, students are part of our team. Routine obstetric precautions reviewed. Encouraged to seek out care at office or emergency room [facility] MAU preferred) for urgent and/or emergent concerns. Return in about 4 weeks (around 12/01/2020) for in-person LOB/APP OK.     Clarisa Fling, NP  10:32 AM 11/03/2020

## 2020-11-03 NOTE — Progress Notes (Signed)
NOB in office, denies pain today. Intake and U/S completed on 10-27-20

## 2020-11-04 LAB — OBSTETRIC PANEL, INCLUDING HIV
Antibody Screen: NEGATIVE
Basophils Absolute: 0 10*3/uL (ref 0.0–0.2)
Basos: 1 %
EOS (ABSOLUTE): 0.1 10*3/uL (ref 0.0–0.4)
Eos: 1 %
HIV Screen 4th Generation wRfx: NONREACTIVE
Hematocrit: 41.5 % (ref 34.0–46.6)
Hemoglobin: 14 g/dL (ref 11.1–15.9)
Hepatitis B Surface Ag: NEGATIVE
Immature Grans (Abs): 0 10*3/uL (ref 0.0–0.1)
Immature Granulocytes: 0 %
Lymphocytes Absolute: 2.8 10*3/uL (ref 0.7–3.1)
Lymphs: 32 %
MCH: 31.3 pg (ref 26.6–33.0)
MCHC: 33.7 g/dL (ref 31.5–35.7)
MCV: 93 fL (ref 79–97)
Monocytes Absolute: 0.5 10*3/uL (ref 0.1–0.9)
Monocytes: 5 %
Neutrophils Absolute: 5.4 10*3/uL (ref 1.4–7.0)
Neutrophils: 61 %
Platelets: 273 10*3/uL (ref 150–450)
RBC: 4.48 x10E6/uL (ref 3.77–5.28)
RDW: 12.2 % (ref 11.7–15.4)
RPR Ser Ql: NONREACTIVE
Rh Factor: POSITIVE
Rubella Antibodies, IGG: 3.67 index (ref >0.99)
WBC: 8.8 10*3/uL (ref 3.4–10.8)

## 2020-11-04 LAB — PROTEIN / CREATININE RATIO, URINE
Creatinine, Urine: 82.9 mg/dL
Protein, Ur: 8 mg/dL
Protein/Creat Ratio: 97 mg/g creat (ref 0–200)

## 2020-11-04 LAB — HEMOGLOBIN A1C
Est. average glucose Bld gHb Est-mCnc: 94 mg/dL
Hgb A1c MFr Bld: 4.9 % (ref 4.8–5.6)

## 2020-11-04 LAB — COMPREHENSIVE METABOLIC PANEL
ALT: 18 IU/L (ref 0–32)
AST: 12 IU/L (ref 0–40)
Albumin/Globulin Ratio: 1.8 (ref 1.2–2.2)
Albumin: 4.8 g/dL (ref 3.9–5.0)
Alkaline Phosphatase: 54 IU/L (ref 44–121)
BUN/Creatinine Ratio: 14 (ref 9–23)
BUN: 8 mg/dL (ref 6–20)
Bilirubin Total: 0.6 mg/dL (ref 0.0–1.2)
CO2: 20 mmol/L (ref 20–29)
Calcium: 9.5 mg/dL (ref 8.7–10.2)
Chloride: 104 mmol/L (ref 96–106)
Creatinine, Ser: 0.58 mg/dL (ref 0.57–1.00)
Globulin, Total: 2.6 g/dL (ref 1.5–4.5)
Glucose: 93 mg/dL (ref 65–99)
Potassium: 4.4 mmol/L (ref 3.5–5.2)
Sodium: 141 mmol/L (ref 134–144)
Total Protein: 7.4 g/dL (ref 6.0–8.5)
eGFR: 127 mL/min/{1.73_m2} (ref >59)

## 2020-11-04 LAB — HEPATITIS C ANTIBODY: Hep C Virus Ab: 0.1 s/co ratio (ref 0.0–0.9)

## 2020-11-05 LAB — CYTOLOGY - PAP
Chlamydia: NEGATIVE
Comment: NEGATIVE
Comment: NORMAL
Diagnosis: NEGATIVE
Neisseria Gonorrhea: NEGATIVE

## 2020-11-05 LAB — CULTURE, OB URINE

## 2020-11-05 LAB — URINE CULTURE, OB REFLEX

## 2020-11-12 ENCOUNTER — Encounter: Payer: Self-pay | Admitting: Women's Health

## 2020-12-01 ENCOUNTER — Ambulatory Visit (INDEPENDENT_AMBULATORY_CARE_PROVIDER_SITE_OTHER): Payer: Medicaid Other | Admitting: Women's Health

## 2020-12-01 ENCOUNTER — Other Ambulatory Visit: Payer: Self-pay

## 2020-12-01 VITALS — BP 136/88 | HR 96 | Wt 297.0 lb

## 2020-12-01 DIAGNOSIS — Z3481 Encounter for supervision of other normal pregnancy, first trimester: Secondary | ICD-10-CM | POA: Diagnosis not present

## 2020-12-01 DIAGNOSIS — O99210 Obesity complicating pregnancy, unspecified trimester: Secondary | ICD-10-CM

## 2020-12-01 DIAGNOSIS — Z3A14 14 weeks gestation of pregnancy: Secondary | ICD-10-CM

## 2020-12-01 DIAGNOSIS — R030 Elevated blood-pressure reading, without diagnosis of hypertension: Secondary | ICD-10-CM

## 2020-12-01 DIAGNOSIS — O10919 Unspecified pre-existing hypertension complicating pregnancy, unspecified trimester: Secondary | ICD-10-CM | POA: Insufficient documentation

## 2020-12-01 DIAGNOSIS — O119 Pre-existing hypertension with pre-eclampsia, unspecified trimester: Secondary | ICD-10-CM | POA: Insufficient documentation

## 2020-12-01 NOTE — Progress Notes (Signed)
Subjective:  Joan Dawson is a 30 y.o. G2P1001 at [redacted]w[redacted]d being seen today for ongoing prenatal care.  She is currently monitored for the following issues for this low-risk pregnancy and has History of migraine; Right knee pain; Encounter for supervision of normal pregnancy in first trimester; Obesity in pregnancy; and Elevated blood pressure reading in office without diagnosis of hypertension on their problem list.  Patient reports no complaints.  Contractions: Not present. Vag. Bleeding: None.  Movement: Absent. Denies leaking of fluid.   The following portions of the patient's history were reviewed and updated as appropriate: allergies, current medications, past family history, past medical history, past social history, past surgical history and problem list. Problem list updated.  Objective:   Vitals:   12/01/20 0858 12/01/20 0931  BP: (!) 148/80 136/88  Pulse: 72 96  Weight: 297 lb (134.7 kg)     Fetal Status: Fetal Heart Rate (bpm): 154   Movement: Absent     General:  Alert, oriented and cooperative. Patient is in no acute distress.  Skin: Skin is warm and dry. No rash noted.   Cardiovascular: Normal heart rate noted  Respiratory: Normal respiratory effort, no problems with respiration noted  Abdomen: Soft, gravid, appropriate for gestational age. Pain/Pressure: Absent     Pelvic: Vag. Bleeding: None     Cervical exam deferred        Extremities: Normal range of motion.  Edema: None  Mental Status: Normal mood and affect. Normal behavior. Normal judgment and thought content.   Urinalysis:      Assessment and Plan:  Pregnancy: G2P1001 at [redacted]w[redacted]d  1. Encounter for supervision of other normal pregnancy in first trimester - Genetic Screening cancelled - contraception info given - CBE info given - AFP next visit - waterbirth info given -Single elevated BP 8/10 (148/80), normal repeat, no hx of elevated pressure in chart in past 2 yrs outside of 2 ED visits for pain, will  recheck in one week as patient is interested in waterbirth and was highly anxious coming in at time of visit due to blood draw scheduled.  2. Obesity in pregnancy -pt has not yet started low dose ASA, discussed at visit, encouraged to start  3. [redacted] weeks gestation of pregnancy  Preterm labor symptoms and general obstetric precautions including but not limited to vaginal bleeding, contractions, leaking of fluid and fetal movement were reviewed in detail with the patient. I discussed the assessment and treatment plan with the patient. The patient was provided an opportunity to ask questions and all were answered. The patient agreed with the plan and demonstrated an understanding of the instructions. The patient was advised to call back or seek an in-person office evaluation/go to MAU at [facility] for any urgent or concerning symptoms. Please refer to After Visit Summary for other counseling recommendations.  Return in about 1 week (around 12/08/2020) for BP check 1 week, 4 weeks in-person LOB/APP OK/AFP.   Chriselda Leppert, Odie Sera, NP

## 2020-12-01 NOTE — Patient Instructions (Addendum)
Maternity Assessment Unit (MAU)  The Maternity Assessment Unit (MAU) is located at the [facility]. The address is: [address], [place], [place], [place] [address]. Please see map below for additional directions.    The Maternity Assessment Unit is designed to help you during your pregnancy, and for up to 6 weeks after delivery, with any pregnancy- or postpartum-related emergencies, if you think you are in labor, or if your water has broken. For example, if you experience nausea and vomiting, vaginal bleeding, severe abdominal or pelvic pain, elevated blood pressure or other problems related to your pregnancy or postpartum time, please come to the Maternity Assessment Unit for assistance.       Second Trimester of Pregnancy  The second trimester of pregnancy is from week 13 through week 27. This is months 4 through 6 of pregnancy. The second trimester is often a time when you feel your best. Your body has adjusted to being pregnant, and you begin to feelbetter physically. During the second trimester: Morning sickness has lessened or stopped completely. You may have more energy. You may have an increase in appetite. The second trimester is also a time when the unborn baby (fetus) is growing rapidly. At the end of the sixth month, the fetus may be up to 12 inches long and weigh about 1 pounds. You will likely begin to feel the baby move (quickening) between 16 and 20 weeks of pregnancy. Body changes during your second trimester Your body continues to go through many changes during your second trimester.The changes vary and generally return to normal after the baby is born. Physical changes Your weight will continue to increase. You will notice your lower abdomen bulging out. You may begin to get stretch marks on your hips, abdomen, and breasts. Your breasts will continue to grow and to become tender. Dark spots or blotches (chloasma or  mask of pregnancy) may develop on your face. A dark line from your belly button to the pubic area (linea nigra) may appear. You may have changes in your hair. These can include thickening of your hair, rapid growth, and changes in texture. Some people also have hair loss during or after pregnancy, or hair that feels dry or thin. Health changes You may develop headaches. You may have heartburn. You may develop constipation. You may develop hemorrhoids or swollen, bulging veins (varicose veins). Your gums may bleed and may be sensitive to brushing and flossing. You may urinate more often because the fetus is pressing on your bladder. You may have back pain. This is caused by: Weight gain. Pregnancy hormones that are relaxing the joints in your pelvis. A shift in weight and the muscles that support your balance. Follow these instructions at home: Medicines Follow your health care provider's instructions regarding medicine use. Specific medicines may be either safe or unsafe to take during pregnancy. Do not take any medicines unless approved by your health care provider. Take a prenatal vitamin that contains at least 600 micrograms (mcg) of folic acid. Eating and drinking Eat a healthy diet that includes fresh fruits and vegetables, whole grains, good sources of protein such as meat, eggs, or tofu, and low-fat dairy products. Avoid raw meat and unpasteurized juice, milk, and cheese. These carry germs that can harm you and your baby. You may need to take these actions to prevent or treat constipation: Drink enough fluid to keep your urine pale yellow. Eat foods that are high in fiber, such as  beans, whole grains, and fresh fruits and vegetables. Limit foods that are high in fat and processed sugars, such as fried or sweet foods. Activity Exercise only as directed by your health care provider. Most people can continue their usual exercise routine during pregnancy. Try to exercise for 30 minutes  at least 5 days a week. Stop exercising if you develop contractions in your uterus. Stop exercising if you develop pain or cramping in the lower abdomen or lower back. Avoid exercising if it is very hot or humid or if you are at a high altitude. Avoid heavy lifting. If you choose to, you may have sex unless your health care provider tells you not to. Relieving pain and discomfort Wear a supportive bra to prevent discomfort from breast tenderness. Take warm sitz baths to soothe any pain or discomfort caused by hemorrhoids. Use hemorrhoid cream if your health care provider approves. Rest with your legs raised (elevated) if you have leg cramps or low back pain. If you develop varicose veins: Wear support hose as told by your health care provider. Elevate your feet for 15 minutes, 3-4 times a day. Limit salt in your diet. Safety Wear your seat belt at all times when driving or riding in a car. Talk with your health care provider if someone is verbally or physically abusive to you. Lifestyle Do not use hot tubs, steam rooms, or saunas. Do not douche. Do not use tampons or scented sanitary pads. Avoid cat litter boxes and soil used by cats. These carry germs that can cause birth defects in the baby and possibly loss of the fetus by miscarriage or stillbirth. Do not use herbal remedies, alcohol, illegal drugs, or medicines that are not approved by your health care provider. Chemicals in these products can harm your baby. Do not use any products that contain nicotine or tobacco, such as cigarettes, e-cigarettes, and chewing tobacco. If you need help quitting, ask your health care provider. General instructions During a routine prenatal visit, your health care provider will do a physical exam and other tests. He or she will also discuss your overall health. Keep all follow-up visits. This is important. Ask your health care provider for a referral to a local prenatal education class. Ask for help if  you have counseling or nutritional needs during pregnancy. Your health care provider can offer advice or refer you to specialists for help with various needs. Where to find more information American Pregnancy Association: americanpregnancy.org [employer]necologists: https://www.todd-brady.net/ Office on [employer] Health: MightyReward.co.nz Contact a health care provider if you have: A headache that does not go away when you take medicine. Vision changes or you see spots in front of your eyes. Mild pelvic cramps, pelvic pressure, or nagging pain in the abdominal area. Persistent nausea, vomiting, or diarrhea. A bad-smelling vaginal discharge or foul-smelling urine. Pain when you urinate. Sudden or extreme swelling of your face, hands, ankles, feet, or legs. A fever. Get help right away if you: Have fluid leaking from your vagina. Have spotting or bleeding from your vagina. Have severe abdominal cramping or pain. Have difficulty breathing. Have chest pain. Have fainting spells. Have not felt your baby move for the time period told by your health care provider. Have new or increased pain, swelling, or redness in an arm or leg. Summary The second trimester of pregnancy is from week 13 through week 27 (months 4 through 6). Do not use herbal remedies, alcohol, illegal drugs, or medicines that are not approved  by your health care provider. Chemicals in these products can harm your baby. Exercise only as directed by your health care provider. Most people can continue their usual exercise routine during pregnancy. Keep all follow-up visits. This is important. This information is not intended to replace advice given to you by your health care provider. Make sure you discuss any questions you have with your healthcare provider. Document Revised: 09/17/2019 Document Reviewed: 07/24/2019 Elsevier Patient Education  2022 Elsevier Inc.       Round  Ligament Pain  The round ligament is a cord of muscle and tissue that helps support the uterus. It can become a source of pain during pregnancy if it becomes stretched or twisted as the baby grows. The pain usually begins in the second trimester (13-28 weeks) of pregnancy, and it can come and go until the baby is delivered.It is not a serious problem, and it does not cause harm to the baby. Round ligament pain is usually a short, sharp, and pinching pain, but it can also be a dull, lingering, and aching pain. The pain is felt in the lower side of the abdomen or in the groin. It usually starts deep in the groin and moves up to the outside of the hip area. The pain may occur when you: Suddenly change position, such as quickly going from a sitting to standing position. Roll over in bed. Cough or sneeze. Do physical activity. Follow these instructions at home:  Watch your condition for any changes. When the pain starts, relax. Then try any of these methods to help with the pain: Sitting down. Flexing your knees up to your abdomen. Lying on your side with one pillow under your abdomen and another pillow between your legs. Sitting in a warm bath for 15-20 minutes or until the pain goes away. Take over-the-counter and prescription medicines only as told by your health care provider. Move slowly when you sit down or stand up. Avoid long walks if they cause pain. Stop or reduce your physical activities if they cause pain. Keep all follow-up visits as told by your health care provider. This is important. Contact a health care provider if: Your pain does not go away with treatment. You feel pain in your back that you did not have before. Your medicine is not helping. Get help right away if: You have a fever or chills. You develop uterine contractions. You have vaginal bleeding. You have nausea or vomiting. You have diarrhea. You have pain when you urinate. Summary Round ligament pain is felt in  the lower abdomen or groin. It is usually a short, sharp, and pinching pain. It can also be a dull, lingering, and aching pain. This pain usually begins in the second trimester (13-28 weeks). It occurs because the uterus is stretching with the growing baby, and it is not harmful to the baby. You may notice the pain when you suddenly change position, when you cough or sneeze, or during physical activity. Relaxing, flexing your knees to your abdomen, lying on one side, or taking a warm bath may help to get rid of the pain. Get help from your health care provider if the pain does not go away or if you have vaginal bleeding, nausea, vomiting, diarrhea, or painful urination. This information is not intended to replace advice given to you by your health care provider. Make sure you discuss any questions you have with your healthcare provider. Document Revised: 03/26/2020 Document Reviewed: 09/26/2017 Elsevier Patient Education  2022 [employer].  Preterm Labor The normal length of a pregnancy is 39-41 weeks. Preterm labor is when labor starts before 37 completed weeks of pregnancy. Babies who are born prematurely and survive may not be fully developed and may be at an increased risk for long-term problems such as cerebral palsy, developmental delays, and vision andhearing problems. Babies who are born too early may have problems soon after birth. Premature babies may have problems regulating blood sugar, body temperature, heart rate, and breathing rate. These babies often have trouble with feeding. The risk ofhaving problems is highest for babies who are born before 34 weeks of pregnancy. What are the causes? The exact cause of this condition is not known. What increases the risk? You are more likely to have preterm labor if you have certain risk factors that relate to your medical history, problems with present and past pregnancies, andlifestyle factors. Medical history You have abnormalities  of the uterus, including a short cervix. You have STIs (sexually transmitted infections) or other infections of the urinary tract and the vagina. You have chronic illnesses, such as blood clotting problems, diabetes, or high blood pressure. You are overweight or underweight. Present and past pregnancies You have had preterm labor before. You are pregnant with twins or other multiples. You have been diagnosed with a condition in which the placenta covers your cervix (placenta previa). You waited less than 18 months between giving birth and becoming pregnant again. Your unborn baby has some abnormalities. You have vaginal bleeding during pregnancy. You became pregnant through in vitro fertilization (IVF). Lifestyle and environmental factors You use tobacco products or drink alcohol. You use drugs. You have stress and no social support. You experience domestic violence. You are exposed to certain chemicals or environmental pollutants. Other factors You are younger than age 33 or older than age 43. What are the signs or symptoms? Symptoms of this condition include: Cramps similar to those that can happen during a menstrual period. The cramps may happen with diarrhea. Pain in the abdomen or lower back. Regular contractions that may feel like tightening of the abdomen. A feeling of increased pressure in the pelvis. Increased watery or bloody mucus discharge from the vagina. Water breaking (ruptured amniotic sac). How is this diagnosed? This condition is diagnosed based on: Your medical history and a physical exam. A pelvic exam. An ultrasound. Monitoring your uterus for contractions. Other tests, including: A swab of the cervix to check for a chemical called fetal fibronectin. Urine tests. How is this treated? Treatment for this condition depends on the length of your pregnancy, your condition, and the health of your baby. Treatment may include: Taking medicines, such as: Hormone  medicines. These may be given early in pregnancy to help support the pregnancy. Medicines to stop contractions. Medicines to help mature the baby's lungs. These may be prescribed if the risk of delivery is high. Medicines to help protect your baby from brain and nerve complications such as cerebral palsy. Bed rest. If the labor happens before 34 weeks of pregnancy, you may need to stay in the hospital. Delivery of the baby. Follow these instructions at home:  Do not use any products that contain nicotine or tobacco. These products include cigarettes, chewing tobacco, and vaping devices, such as e-cigarettes. If you need help quitting, ask your health care provider. Do not drink alcohol. Take over-the-counter and prescription medicines only as told by your health care provider. Rest as told by your health care provider. Return to your normal activities as told by  your health care provider. Ask your health care provider what activities are safe for you. Keep all follow-up visits. This is important. How is this prevented? To increase your chance of having a full-term pregnancy: Do not use drugs or take medicines that have not been prescribed to you during your pregnancy. Talk with your health care provider before taking any herbal supplements, even if you have been taking them regularly. Make sure you gain a healthy amount of weight during your pregnancy. Watch for infection. If you think that you might have an infection, get it checked right away. Symptoms of infection may include: Fever. Abnormal vaginal discharge or discharge that smells bad. Pain or burning with urination. Needing to urinate urgently. Frequently urinating or passing small amounts of urine frequently. Blood in your urine or urine that smells bad or unusual. Where to find more information U.S. Department of Health and Cytogeneticist on Women's Health: http://hoffman.com/ [employer]st[employer]ians and  Gynecologists: www.acog.org Centers for Disease Control and Prevention, Preterm Birth: FootballExhibition.com.br Contact a health care provider if: You think you are going into preterm labor. You have signs or symptoms of preterm labor. You have symptoms of infection. Get help right away if: You are having regular, painful contractions every 5 minutes or less. Your water breaks. Summary Preterm labor is labor that starts before you reach 37 weeks of pregnancy. Delivering your baby early increases your baby's risk of developing long-term problems. You are more likely to have preterm labor if you have certain risk factors that relate to your medical history, problems with present and past pregnancies, and lifestyle factors. Keep all follow-up visits. This is important. Contact a health care provider if you have signs or symptoms of preterm labor. This information is not intended to replace advice given to you by your health care provider. Make sure you discuss any questions you have with your healthcare provider. Document Revised: 04/13/2020 Document Reviewed: 04/13/2020 Elsevier Patient Education  2022 [employer].       Contraception Choices - www.bedsider.org Contraception, also called birth control, refers to methods or devices thatprevent pregnancy. Hormonal methods  Contraceptive implant A contraceptive implant is a thin, plastic tube that contains a hormone that prevents pregnancy. It is different from an intrauterine device (IUD). It is inserted into the upper part of the arm by a health care provider. Implants canbe effective for up to 3 years. Progestin-only injections Progestin-only injections are injections of progestin, a synthetic form of thehormone progesterone. They are given every 3 months by a health care provider. Birth control pills Birth control pills are pills that contain hormones that prevent pregnancy. They must be taken once a day, preferably at the same time each day.  Aprescription is needed to use this method of contraception. Birth control patch The birth control patch contains hormones that prevent pregnancy. It is placed on the skin and must be changed once a week for three weeks and removed on thefourth week. A prescription is needed to use this method of contraception. Vaginal ring A vaginal ring contains hormones that prevent pregnancy. It is placed in the vagina for three weeks and removed on the fourth week. After that, the process is repeated with a new ring. A prescription is needed to use this method ofcontraception. Emergency contraceptive Emergency contraceptives prevent pregnancy after unprotected sex. They come in pill form and can be taken up to 5 days after sex. They work best the sooner they are taken after having sex. Most  emergency contraceptives are available without a prescription. This method should not be used as your only form ofbirth control. Barrier methods  Female condom A female condom is a thin sheath that is worn over the penis during sex. Condoms keep sperm from going inside a woman's body. They can be used with a sperm-killing substance (spermicide) to increase their effectiveness. They should be thrown away after one use. Female condom A female condom is a soft, loose-fitting sheath that is put into the vagina before sex. The condom keeps sperm from going inside a woman's body. Theyshould be thrown away after one use. Diaphragm A diaphragm is a soft, dome-shaped barrier. It is inserted into the vagina before sex, along with a spermicide. The diaphragm blocks sperm from entering the uterus, and the spermicide kills sperm. A diaphragm should be left in thevagina for 6-8 hours after sex and removed within 24 hours. A diaphragm is prescribed and fitted by a health care provider. A diaphragm should be replaced every 1-2 years, after giving birth, after gaining more than15 lb (6.8 kg), and after pelvic surgery. Cervical cap A cervical cap  is a round, soft latex or plastic cup that fits over the cervix. It is inserted into the vagina before sex, along with spermicide. It blocks sperm from entering the uterus. The cap should be left in place for 6-8 hours after sex and removed within 48 hours. A cervical cap must be prescribed andfitted by a health care provider. It should be replaced every 2 years. Sponge A sponge is a soft, circular piece of polyurethane foam with spermicide in it. The sponge helps block sperm from entering the uterus, and the spermicide kills sperm. To use it, you make it wet and then insert it into the vagina. It should be inserted before sex, left in for at least 6 hours after sex, and removed andthrown away within 30 hours. Spermicides Spermicides are chemicals that kill or block sperm from entering the cervix and uterus. They can come as a cream, jelly, suppository, foam, or tablet. A spermicide should be inserted into the vagina with an applicator at least 10-15 minutes before sex to allow time for it to work. The process must be repeatedevery time you have sex. Spermicides do not require a prescription. Intrauterine contraception Intrauterine device (IUD) An IUD is a T-shaped device that is put in a woman's uterus. There are two types: Hormone IUD.This type contains progestin, a synthetic form of the hormone progesterone. This type can stay in place for 3-5 years. Copper IUD.This type is wrapped in copper wire. It can stay in place for 10 years. Permanent methods of contraception Female tubal ligation In this method, a woman's fallopian tubes are sealed, tied, or blocked duringsurgery to prevent eggs from traveling to the uterus. Hysteroscopic sterilization In this method, a small, flexible insert is placed into each fallopian tube. The inserts cause scar tissue to form in the fallopian tubes and block them, so sperm cannot reach an egg. The procedure takes about 3 months to be effective.Another form of birth  control must be used during those 3 months. Female sterilization This is a procedure to tie off the tubes that carry sperm (vasectomy). After the procedure, the man can still ejaculate fluid (semen). Another form of birth control must be used for 3 months after the procedure. Natural planning methods Natural family planning In this method, a couple does not have sex on days when the woman could become pregnant. Calendar method In this method,  the woman keeps track of the length of each menstrual cycle, identifies the days when pregnancy can happen, and does not have sex on those days. Ovulation method In this method, a couple avoids sex during ovulation. Symptothermal method This method involves not having sex during ovulation. The woman typically checks for ovulation bywatching changes in her temperature and in the consistency of cervical mucus. Post-ovulation method In this method, a couple waits to have sex until after ovulation. Where to find more information Centers for Disease Control and Prevention: FootballExhibition.com.br Summary Contraception, also called birth control, refers to methods or devices that prevent pregnancy. Hormonal methods of contraception include implants, injections, pills, patches, vaginal rings, and emergency contraceptives. Barrier methods of contraception can include female condoms, female condoms, diaphragms, cervical caps, sponges, and spermicides. There are two types of IUDs (intrauterine devices). An IUD can be put in a woman's uterus to prevent pregnancy for 3-5 years. Permanent sterilization can be done through a procedure for males and females. Natural family planning methods involve nothaving sex on days when the woman could become pregnant. This information is not intended to replace advice given to you by your health care provider. Make sure you discuss any questions you have with your healthcare provider. Document Revised: 09/15/2019 Document Reviewed:  09/15/2019 Elsevier Patient Education  2022 Elsevier Inc.       Childbirth Education Options: [facility] Department Classes:  Childbirth education classes can help you get ready for a positive parenting experience. You can also meet other expectant parents and get free stuff for your baby. Each class runs for five weeks on the same night and costs $45 for the mother-to-be and her support person. Medicaid covers the cost if you are eligible. Call [phone] to register. [facility] Childbirth Education:  Register at [email or web address]   There are fees associated with some of these classes. Check website for most up-to-date information.   Baby & Me Class: Discuss newborn & infant parenting and family adjustment issues with other new mothers in a relaxed environment. Each week brings a new speaker or baby-centered activity. We encourage new mothers to join [place] every Thursday at 11:00am. Babies birth until crawling. No registration or fee. Daddy [employer]: This course offers Dads-to-be the tools and knowledge needed to feel confident on their journey to becoming new fathers. Experienced dads, who have been trained as coaches, teach dads-to-be how to hold, comfort, diaper, swaddle and play with their infant while being able to support the new mom as well. A class for men taught by men.  Big Brother/Big Sister: Let your children share in the joy of a new brother or sister in this special class designed just for them. Class includes discussion about how families care for babies: swaddling, holding, diapering, safety as well as how they can be helpful in their new role. This class is designed for children ages 2 to 44, but any age is welcome. Please register each child individually.  Mom Talk: This mom-led group offers support and connection to mothers as they journey through the adjustments and struggles of that sometimes overwhelming first year after the birth of a child. Tuesdays  at 10:00am and Thursdays at 6:00pm. Babies welcome. No registration or fee. Breastfeeding Support Group: This group is a mother-to-mother support circle where moms have the opportunity to share their breastfeeding experiences. A Lactation Consultant is present for questions and concerns. Meets each Tuesday at 11:00am. No fee or registration. Breastfeeding Your Baby: Learn what to expect in the  first days of breastfeeding your newborn.  This class will help you feel more confident with the skills needed to begin your breastfeeding experience. Many new mothers are concerned about breastfeeding after leaving the hospital. This class will also address the most common fears and challenges about breastfeeding during the first few weeks, months and beyond. (call for fee) Comfort Techniques and Tour: This 2 hour interactive class will provide you the opportunity to learn & practice hands-on techniques that can help relieve some of the discomfort of labor and encourage your baby to rotate toward the best position for birth. You and your partner will be able to try a variety of labor positions with birth balls and rebozos as well as practice breathing, relaxation, and visualization techniques. A tour of the [facility] is included with this class.  Childbirth Class- Weekend Option: This class is a Weekend version of our Birth & Baby series. It is designed for parents who have a difficult time fitting several weeks of classes into their schedule. It covers the care of your newborn and the basics of labor and childbirth. It also includes a Maternity Care Center Tour of [facility] and lunch. The class is held two consecutive days: beginning on Friday evening from 6:30 - 8:30 p.m. and the next day, Saturday from 9 a.m. - 4 p.m. (call for fee) Linden DolinWaterbirth Class: Interested in a waterbirth?  This informational class will help you discover whether waterbirth is the right fit for you. Education  about waterbirth itself, supplies you would need and how to assemble your support team is what you can expect from this class. Some obstetrical practices require this class in order to pursue a waterbirth. (Not all obstetrical practices offer waterbirth-check with your healthcare provider.) Register only the expectant mom, but you are encouraged to bring your partner to class! Required if planning waterbirth, no fee. Infant/Child CPR: Parents, grandparents, babysitters, and friends learn Cardio-Pulmonary Resuscitation skills for infants and children. You will also learn how to treat both conscious and unconscious choking in infants and children. This Family & Friends program does not offer certification. Register each participant individually to ensure that enough mannequins are available. (Call for fee) Grandparent Love: Expecting a grandbaby? This class is for you! Learn about the latest infant care and safety recommendations and ways to support your own child as he or she transitions into the parenting role. Taught by Registered Nurses who are childbirth instructors, but most importantly...they are grandmothers too! Childbirth Class- Natural Childbirth: This series of 5 weekly classes is for expectant parents who want to learn and practice natural methods of coping with the process of labor and childbirth. Relaxation, breathing, massage, visualization, role of the partner, and helpful positioning are highlighted. Participants learn how to be confident in their body's ability to give birth. This class will empower and help parents make informed decisions about their own care. Includes discussion that will help new parents transition into the immediate postpartum period. Maternity Care Center Tour of [facility] is included. We suggest taking this class between 25-32 weeks, but it's only a recommendation. Childbirth Class- 3 week Series: This option of 3 weekly classes helps you and your labor partner  prepare for childbirth. Newborn care, labor & birth, cesarean birth, pain management, and comfort techniques are discussed and a Maternity Care Center Tour of [facility] is included. The class meets at the same time, on the same day of the week for 3 consecutive weeks beginning with the  starting date you choose.  Marvelous Multiples: Expecting twins, triplets, or more? This class covers the differences in labor, birth, parenting, and breastfeeding issues that face multiples' parents. NICU tour is included. Led by a Certified Childbirth Educator who is the mother of twins. No fee. Caring for Baby: This class is for expectant and adoptive parents who want to learn and practice the most up-to-date newborn care for their babies. Focus is on birth through the first six weeks of life. Topics include feeding, bathing, diapering, crying, umbilical cord care, circumcision care and safe sleep. Parents learn to recognize symptoms of illness and when to call the pediatrician. Register only the mom-to-be and your partner or support person can plan to come with you!  Childbirth Class- online option: This online class offers you the freedom to complete a Birth and Baby series in the comfort of your own home. The flexibility of this option allows you to review sections at your own pace, at times convenient to you and your support people. It includes additional video information, animations, quizzes, and extended activities. Get organized with helpful eClass tools, checklists, and trackers. Once you register online for the class, you will receive an email within a few days to accept the invitation and begin the class when the time is right for you. The content will be available to you for 60 days.             Hypertension During Pregnancy High blood pressure (hypertension) is when the force of blood pumping through the arteries is high enough to cause problems with your health. Arteries are blood vessels that  carry blood from the heart throughout the body. Hypertension during pregnancy can causeproblems for you and your baby. It can be mild or severe. There are different types of hypertension that can happen during pregnancy. These include: Chronic hypertension. This happens when you had high blood pressure before you became pregnant, and it continues during the pregnancy. Hypertension that develops before you are [redacted] weeks pregnant and continues during the pregnancy is also called chronic hypertension. If you have chronic hypertension, it will not go away after you have your baby. You will need follow-up visits with your health care provider after you have your baby. Your health care provider may want you to keep taking medicine for your blood pressure. Gestational hypertension. This is hypertension that develops after the 20th week of pregnancy. Gestational hypertension usually goes away after you have your baby, but your health care provider will need to monitor your blood pressure to make sure that it is getting better. Postpartum hypertension. This is high blood pressure that was present before delivery and continues after delivery or that starts after delivery. This usually occurs within 48 hours after childbirth but may occur up to 6 weeks after giving birth. When hypertension during pregnancy is severe, it is a medical emergency thatrequires treatment right away. How does this affect me? Women who have hypertension during pregnancy have a greater chance of developing hypertension later in life or during future pregnancies. In some cases, hypertension during pregnancy can cause serious complications, such as: Stroke. Heart attack. Injury to other organs, such as kidneys, lungs, or liver. Preeclampsia. A condition called hemolysis, elevated liver enzymes, and low platelet count (HELLP) syndrome. Convulsions or seizures. Placental abruption. How does this affect my baby? Hypertension during pregnancy  can affect your baby. Your baby may: Be born early (prematurely). Not weigh as much as he or she should at birth (low birth weight).  Not tolerate labor well, leading to an unplanned cesarean delivery. This condition may also result in a baby's death before birth (stillbirth). What are the risks? There are certain factors that make it more likely for you to develop hypertension during pregnancy. These include: Having hypertension during a previous pregnancy or a family history of hypertension. Being overweight. Being age 20 or older. Being pregnant for the first time. Being pregnant with more than one baby. Becoming pregnant using fertilization methods, such as IVF (in vitro fertilization). Having other medical problems, such as diabetes, kidney disease, or lupus. What can I do to lower my risk? The exact cause of hypertension during pregnancy is not known. You may be able to lower your risk by: Maintaining a healthy weight. Eating a healthy and balanced diet. Following your health care provider's instructions about treating any long-term conditions that you had before becoming pregnant. It is very important to keep all of your prenatal care appointments. Your health care provider will check your blood pressure and make sure that your pregnancy is progressing as expected. If a problem is found, early treatmentcan prevent complications. How is this treated? Treatment for hypertension during pregnancy varies depending on the type of hypertension you have and how serious it is. If you were taking medicine for high blood pressure before you became pregnant, talk with your health care provider. You may need to change medicine during pregnancy because some medicines, like ACE inhibitors, may not be considered safe for your baby. If you have gestational hypertension, your health care provider may order medicine to treat this during pregnancy. If you are at risk for preeclampsia, your health care  provider may recommend that you take a low-dose aspirin during your pregnancy. If you have severe hypertension, you may need to be hospitalized so you and your baby can be monitored closely. You may also need to be given medicine to lower your blood pressure. In some cases, if your condition gets worse, you may need to deliver your baby early. Follow these instructions at home: Eating and drinking  Drink enough fluid to keep your urine pale yellow. Avoid caffeine.  Lifestyle Do not use any products that contain nicotine or tobacco. These products include cigarettes, chewing tobacco, and vaping devices, such as e-cigarettes. If you need help quitting, ask your health care provider. Do not use alcohol or drugs. Avoid stress as much as possible. Rest and get plenty of sleep. Regular exercise can help to reduce your blood pressure. Ask your health care provider what kinds of exercise are best for you. General instructions Take over-the-counter and prescription medicines only as told by your health care provider. Keep all prenatal and follow-up visits. This is important. Contact a health care provider if: You have symptoms that your health care provider told you may require more treatment or monitoring, such as: Headaches. Nausea or vomiting. Abdominal pain. Dizziness. Light-headedness. Get help right away if: You have symptoms of serious complications, such as: Severe abdominal pain that does not get better with treatment. A severe headache that does not get better, blurred vision, or double vision. Vomiting that does not get better. Sudden, rapid weight gain or swelling in your hands, ankles, or face. Vaginal bleeding. Blood in your urine. Shortness of breath or chest pain. Weakness on one side of your body or difficulty speaking. Your baby is not moving as much as usual. These symptoms may represent a serious problem that is an emergency. Do not wait to see if the symptoms will  go  away. Get medical help right away. Call your local emergency services (911 in the [place] Do not drive yourself to the hospital. Summary Hypertension during pregnancy can cause problems for you and your baby. Treatment for hypertension during pregnancy varies depending on the type of hypertension you have and how serious it is. Keep all prenatal and follow-up visits. This is important. Get help right away if you have symptoms of serious complications related to high blood pressure. This information is not intended to replace advice given to you by your health care provider. Make sure you discuss any questions you have with your healthcare provider. Document Revised: 01/01/2020 Document Reviewed: 01/01/2020 Elsevier Patient Education  2022 [employer].        Considering [place]? Guide for patients at [facility]) Why consider waterbirth? Gentle birth for babies  Less pain medicine used in labor  May allow for passive descent/less pushing  May reduce perineal tears  More mobility and instinctive maternal position changes  Increased maternal relaxation   Is waterbirth safe? What are the risks of infection, drowning or other complications? Infection:  Very low risk (3.7 % for tub vs 4.8% for bed)  7 in 8000 waterbirths with documented infection  Poorly cleaned equipment most common cause  Slightly lower group B strep transmission rate  Drowning  Maternal:  Very low risk  Related to seizures or fainting  Newborn:  Very low risk. No evidence of increased risk of respiratory problems in multiple large studies  Physiological protection from breathing under water  Avoid underwater birth if there are any fetal complications  Once baby's head is out of the water, keep it out.  Birth complication  Some reports of cord trauma, but risk decreased by bringing baby to surface gradually  No evidence of increased risk of shoulder dystocia. Mothers can usually  change positions faster in water than in a bed, possibly aiding the maneuvers to free the shoulder.   There are 2 things you MUST do to have a waterbirth with [facility]: Attend a waterbirth class at [facility] at [facility]   3rd Wednesday of every month from 7-9 pm (virtual during COVID) [employer] at [email or web address] or [email or web address] or by calling [phone] Bring [place] the certificate from the class to your prenatal appointment or send via MyChart Meet with a midwife at 36 weeks* to see if you can still plan a waterbirth and to sign the consent.   *We also recommend that you schedule as many of your prenatal visits with a midwife as possible.    Helpful information: You may want to bring a bathing suit top to the hospital to wear during labor but this is optional.  All other supplies are provided by the hospital. Please arrive at the hospital with signs of active labor, and do not wait at home until late in labor. It takes 45 min- 2 hours for COVID testing, fetal monitoring, and check in to your room to take place, plus transport and filling of the waterbirth tub.    Things that would prevent you from having a waterbirth: Unknown or Positive COVID-19 diagnosis upon admission to hospital* Premature, <37wks  Previous cesarean birth  Presence of thick meconium-stained fluid  Multiple gestation (Twins, triplets, etc.)  Uncontrolled diabetes or gestational diabetes requiring medication  Hypertension diagnosed in pregnancy or preexisting hypertension (gestational hypertension, preeclampsia, or chronic hypertension) Fetal growth restriction (your baby measures less than 10th percentile on ultrasound) Heavy vaginal  bleeding  Non-reassuring fetal heart rate  Active infection (MRSA, etc.). Group B Strep is NOT a contraindication for waterbirth.  If your labor has to be induced and induction method requires continuous monitoring of the baby's heart rate   Other risks/issues identified by your obstetrical provider   Please remember that birth is unpredictable. Under certain unforeseeable circumstances your provider may advise against giving birth in the tub. These decisions will be made on a case-by-case basis and with the safety of you and your baby as our highest priority.   *Please remember that in order to have a waterbirth, you must test Negative to COVID-19 upon admission to the hospital.  Updated 08/02/20       Alpha-Fetoprotein Test Why am I having this test? The alpha-fetoprotein test is a lab test most commonly used for pregnant women to help screen for birth defects in their unborn baby. It can be used to screen for chromosome (DNA) abnormalities, problems with the brain or spinal cord, or problems with the abdominal wall of the unborn baby (fetus). The alpha-fetoprotein test may also be done for men or nonpregnant women tocheck for certain cancers. What is being tested? This test measures the amount of alpha-fetoprotein (AFP) in your blood. AFP is a protein that is made by the liver. Levels can be detected in the mother's blood during pregnancy, starting at 10 weeks and peaking at 16-18 weeks of the pregnancy. Abnormal levels can sometimes be a sign of a birth defect in thebaby. Certain cancers can cause a high level of AFP in men and nonpregnant women. What kind of sample is taken?  A blood sample is required for this test. It is usually collected by insertinga needle into a blood vessel. How are the results reported? Your test results will be reported as values. Your health care provider will compare your results to normal ranges that were established after testing a large group of people (reference values). Reference values may vary among labs and hospitals. For this test, common reference values are: Adult: Less than 40 ng/mL or less than 40 mcg/L (SI units). Child younger than 1 year: Less than 30 ng/mL. If you are  pregnant, the values may also vary based on how long you have beenpregnant. What do the results mean? Results that are above the reference values in pregnant women may indicate the following for the baby: Neural tube defects, such as abnormalities of the spinal cord or brain. Abdominal wall defects. Multiple pregnancy such as twins. Fetal distress or fetal death. Results that are above the reference values in men or nonpregnant women may indicate: Reproductive cancers, such as ovarian or testicular cancer. Liver cancer. Liver cell death. Other types of cancer. Very low levels of AFP in pregnant women may indicate Down syndrome for thebaby. Talk with your health care provider about what your results mean. Questions to ask your health care provider Ask your health care provider, or the department that is doing the test: When will my results be ready? How will I get my results? What are my treatment options? What other tests do I need? What are my next steps? Summary The alpha-fetoprotein test is done on pregnant women to help screen for birth defects in their unborn baby. Certain cancers can cause a high level of AFP in men and nonpregnant women. For this test, a blood sample is usually collected by inserting a needle into a blood vessel. Talk with your health care provider about what your results mean. This  information is not intended to replace advice given to you by your health care provider. Make sure you discuss any questions you have with your healthcare provider. Document Revised: 10/31/2019 Document Reviewed: 10/31/2019 Elsevier Patient Education  2022 [employer].

## 2020-12-07 ENCOUNTER — Ambulatory Visit (INDEPENDENT_AMBULATORY_CARE_PROVIDER_SITE_OTHER): Payer: Medicaid Other

## 2020-12-07 ENCOUNTER — Other Ambulatory Visit: Payer: Self-pay

## 2020-12-07 VITALS — BP 127/77 | HR 74

## 2020-12-07 DIAGNOSIS — Z0130 Encounter for examination of blood pressure without abnormal findings: Secondary | ICD-10-CM

## 2020-12-07 NOTE — Progress Notes (Signed)
Subjective:  Joan Dawson is a 30 y.o. female here for BP check.   Hypertension ROS: taking medications as instructed, no medication side effects noted, no TIA's, no chest pain on exertion, no dyspnea on exertion, and no swelling of ankles.    Objective:  BP 127/77   LMP 08/25/2020   Appearance alert, well appearing, and in no distress. General exam BP noted to be well controlled today in office.    Assessment:   Blood Pressure well controlled.   Plan:  Current treatment plan is effective, no change in therapy.Marland Kitchen

## 2020-12-29 ENCOUNTER — Other Ambulatory Visit: Payer: Self-pay

## 2020-12-29 ENCOUNTER — Ambulatory Visit (INDEPENDENT_AMBULATORY_CARE_PROVIDER_SITE_OTHER): Payer: Medicaid Other | Admitting: Obstetrics

## 2020-12-29 ENCOUNTER — Encounter: Payer: Self-pay | Admitting: Obstetrics

## 2020-12-29 VITALS — BP 97/69 | HR 91 | Wt 304.8 lb

## 2020-12-29 DIAGNOSIS — O99210 Obesity complicating pregnancy, unspecified trimester: Secondary | ICD-10-CM

## 2020-12-29 DIAGNOSIS — Z3480 Encounter for supervision of other normal pregnancy, unspecified trimester: Secondary | ICD-10-CM

## 2020-12-29 DIAGNOSIS — R030 Elevated blood-pressure reading, without diagnosis of hypertension: Secondary | ICD-10-CM

## 2020-12-29 NOTE — Progress Notes (Signed)
Subjective:  Joan Dawson is a 30 y.o. G2P1001 at [redacted]w[redacted]d being seen today for ongoing prenatal care.  She is currently monitored for the following issues for this low-risk pregnancy and has History of migraine; Right knee pain; Encounter for supervision of normal pregnancy in first trimester; Obesity in pregnancy; and Elevated blood pressure reading in office without diagnosis of hypertension on their problem list.  Patient reports headache, occasional.  Contractions: Not present.  .  Movement: Increased. Denies leaking of fluid.   The following portions of the patient's history were reviewed and updated as appropriate: allergies, current medications, past family history, past medical history, past social history, past surgical history and problem list. Problem list updated.  Objective:   Vitals:   12/29/20 0930  BP: (!) 146/81  Pulse: 91  Weight: (!) 304 lb 12.8 oz (138.3 kg)    Fetal Status:     Movement: Increased     General:  Alert, oriented and cooperative. Patient is in no acute distress.  Skin: Skin is warm and dry. No rash noted.   Cardiovascular: Normal heart rate noted  Respiratory: Normal respiratory effort, no problems with respiration noted  Abdomen: Soft, gravid, appropriate for gestational age. Pain/Pressure: Absent     Pelvic:  Cervical exam deferred        Extremities: Normal range of motion.  Edema: None  Mental Status: Normal mood and affect. Normal behavior. Normal judgment and thought content.   Urinalysis:      Assessment and Plan:  Pregnancy: G2P1001 at [redacted]w[redacted]d  1. Supervision of other normal pregnancy, antepartum Rx: - AFP  2. Elevated blood pressure reading in office without diagnosis of hypertension - patient states that she is under significant social and emotional stress - follow BP closely  3. Obesity in pregnancy   Preterm labor symptoms and general obstetric precautions including but not limited to vaginal bleeding, contractions, leaking of  fluid and fetal movement were reviewed in detail with the patient. Please refer to After Visit Summary for other counseling recommendations.   Return in about 4 weeks (around 01/26/2021) for ROB.   Brock Bad, MD  12/29/20

## 2020-12-31 LAB — AFP, SERUM, OPEN SPINA BIFIDA
AFP MoM: 1.43
AFP Value: 43.6 ng/mL
Gest. Age on Collection Date: 18 weeks
Maternal Age At EDD: 28.4 yr
OSBR Risk 1 IN: 3311
Test Results:: NEGATIVE
Weight: 304 [lb_av]

## 2021-01-04 ENCOUNTER — Encounter: Payer: Self-pay | Admitting: Obstetrics

## 2021-01-05 ENCOUNTER — Ambulatory Visit: Payer: Medicaid Other

## 2021-01-13 ENCOUNTER — Encounter: Payer: Self-pay | Admitting: *Deleted

## 2021-01-13 ENCOUNTER — Other Ambulatory Visit: Payer: Self-pay

## 2021-01-13 ENCOUNTER — Ambulatory Visit (HOSPITAL_BASED_OUTPATIENT_CLINIC_OR_DEPARTMENT_OTHER): Payer: Medicaid Other

## 2021-01-13 ENCOUNTER — Ambulatory Visit: Payer: Medicaid Other | Attending: Women's Health | Admitting: *Deleted

## 2021-01-13 VITALS — BP 126/68 | HR 88

## 2021-01-13 DIAGNOSIS — Z3A20 20 weeks gestation of pregnancy: Secondary | ICD-10-CM | POA: Insufficient documentation

## 2021-01-13 DIAGNOSIS — Z3481 Encounter for supervision of other normal pregnancy, first trimester: Secondary | ICD-10-CM

## 2021-01-13 DIAGNOSIS — O321XX0 Maternal care for breech presentation, not applicable or unspecified: Secondary | ICD-10-CM | POA: Diagnosis not present

## 2021-01-13 DIAGNOSIS — Z363 Encounter for antenatal screening for malformations: Secondary | ICD-10-CM | POA: Insufficient documentation

## 2021-01-13 DIAGNOSIS — O99210 Obesity complicating pregnancy, unspecified trimester: Secondary | ICD-10-CM

## 2021-01-13 DIAGNOSIS — O99212 Obesity complicating pregnancy, second trimester: Secondary | ICD-10-CM | POA: Diagnosis present

## 2021-01-14 ENCOUNTER — Other Ambulatory Visit: Payer: Self-pay | Admitting: *Deleted

## 2021-01-14 DIAGNOSIS — Z6841 Body Mass Index (BMI) 40.0 and over, adult: Secondary | ICD-10-CM

## 2021-01-26 ENCOUNTER — Other Ambulatory Visit: Payer: Self-pay

## 2021-01-26 ENCOUNTER — Ambulatory Visit (INDEPENDENT_AMBULATORY_CARE_PROVIDER_SITE_OTHER): Payer: Medicaid Other | Admitting: Women's Health

## 2021-01-26 VITALS — BP 130/77 | HR 82 | Wt 311.0 lb

## 2021-01-26 DIAGNOSIS — Z3A22 22 weeks gestation of pregnancy: Secondary | ICD-10-CM

## 2021-01-26 DIAGNOSIS — R030 Elevated blood-pressure reading, without diagnosis of hypertension: Secondary | ICD-10-CM

## 2021-01-26 DIAGNOSIS — O99210 Obesity complicating pregnancy, unspecified trimester: Secondary | ICD-10-CM

## 2021-01-26 DIAGNOSIS — Z3481 Encounter for supervision of other normal pregnancy, first trimester: Secondary | ICD-10-CM

## 2021-01-26 NOTE — Progress Notes (Signed)
Pt states she has been having problems with sleep.

## 2021-01-26 NOTE — Progress Notes (Signed)
Subjective:  Joan Dawson is a 30 y.o. G2P1001 at [redacted]w[redacted]d being seen today for ongoing prenatal care.  She is currently monitored for the following issues for this low-risk pregnancy and has History of migraine; Right knee pain; Encounter for supervision of normal pregnancy in first trimester; Obesity in pregnancy; and Elevated blood pressure reading in office without diagnosis of hypertension on their problem list.  Patient reports  difficulty sleeping .  Contractions: Not present. Vag. Bleeding: None.  Movement: Present. Denies leaking of fluid.   The following portions of the patient's history were reviewed and updated as appropriate: allergies, current medications, past family history, past medical history, past social history, past surgical history and problem list. Problem list updated.  Objective:   Vitals:   01/26/21 0831  BP: 130/77  Pulse: 82  Weight: (!) 311 lb (141.1 kg)    Fetal Status: Fetal Heart Rate (bpm): 150   Movement: Present     General:  Alert, oriented and cooperative. Patient is in no acute distress.  Skin: Skin is warm and dry. No rash noted.   Cardiovascular: Normal heart rate noted  Respiratory: Normal respiratory effort, no problems with respiration noted  Abdomen: Soft, gravid, appropriate for gestational age. Pain/Pressure: Absent     Pelvic: Vag. Bleeding: None     Cervical exam deferred        Extremities: Normal range of motion.     Mental Status: Normal mood and affect. Normal behavior. Normal judgment and thought content.   Urinalysis:      Assessment and Plan:  Pregnancy: G2P1001 at [redacted]w[redacted]d  1. Encounter for supervision of other normal pregnancy in first trimester PHQ9 SCORE ONLY 10/27/2020  PHQ-9 Total Score 2   GAD 7 : Generalized Anxiety Score 10/27/2020  Nervous, Anxious, on Edge 0  Control/stop worrying 0  Worry too much - different things 0  Trouble relaxing 0  Restless 0  Easily annoyed or irritable 1  Afraid - awful might happen 1   Total GAD 7 Score 2   -GTT/labs next visit -pt has not yet taken WB class, info given on WB class and encouraged to sign up ASAP  2. Obesity in pregnancy -Weekly testing 34wks d/t BMI 44 per MFM  3. Elevated blood pressure reading in office without diagnosis of hypertension -no elevated BP today  4. [redacted] weeks gestation of pregnancy  Preterm labor symptoms and general obstetric precautions including but not limited to vaginal bleeding, contractions, leaking of fluid and fetal movement were reviewed in detail with the patient. I discussed the assessment and treatment plan with the patient. The patient was provided an opportunity to ask questions and all were answered. The patient agreed with the plan and demonstrated an understanding of the instructions. The patient was advised to call back or seek an in-person office evaluation/go to MAU at [facility] for any urgent or concerning symptoms. Please refer to After Visit Summary for other counseling recommendations.  Return in about 5 weeks (around 03/02/2021) for in-person LOB/GTT/labs/APP OK.   Yani Coventry, Odie Sera, NP

## 2021-01-26 NOTE — Patient Instructions (Addendum)
Maternity Assessment Unit (MAU)  The Maternity Assessment Unit (MAU) is located at the [facility] and [place] at [facility] The address is: [address] [place], [place], [place] [address]. Please see map below for additional directions.    The Maternity Assessment Unit is designed to help you during your pregnancy, and for up to 6 weeks after delivery, with any pregnancy- or postpartum-related emergencies, if you think you are in labor, or if your water has broken. For example, if you experience nausea and vomiting, vaginal bleeding, severe abdominal or pelvic pain, elevated blood pressure or other problems related to your pregnancy or postpartum time, please come to the Maternity Assessment Unit for assistance.       Preterm Labor The normal length of a pregnancy is 39-41 weeks. Preterm labor is when labor starts before 37 completed weeks of pregnancy. Babies who are born prematurely and survive may not be fully developed and may be at an increased risk for long-term problems such as cerebral palsy, developmental delays, and vision and hearing problems. Babies who are born too early may have problems soon after birth. Premature babies may have problems regulating blood sugar, body temperature, heart rate, and breathing rate. These babies often have trouble with feeding. The risk of having problems is highest for babies who are born before 48 weeks of pregnancy. What are the causes? The exact cause of this condition is not known. What increases the risk? You are more likely to have preterm labor if you have certain risk factors that relate to your medical history, problems with present and past pregnancies, and lifestyle factors. Medical history You have abnormalities of the uterus, including a short cervix. You have STIs (sexually transmitted infections) or other infections of the urinary tract and the vagina. You have chronic illnesses, such as blood clotting  problems, diabetes, or high blood pressure. You are overweight or underweight. Present and past pregnancies You have had preterm labor before. You are pregnant with twins or other multiples. You have been diagnosed with a condition in which the placenta covers your cervix (placenta previa). You waited less than 18 months between giving birth and becoming pregnant again. Your unborn baby has some abnormalities. You have vaginal bleeding during pregnancy. You became pregnant through in vitro fertilization (IVF). Lifestyle and environmental factors You use tobacco products or drink alcohol. You use drugs. You have stress and no social support. You experience domestic violence. You are exposed to certain chemicals or environmental pollutants. Other factors You are younger than age 62 or older than age 53. What are the signs or symptoms? Symptoms of this condition include: Cramps similar to those that can happen during a menstrual period. The cramps may happen with diarrhea. Pain in the abdomen or lower back. Regular contractions that may feel like tightening of the abdomen. A feeling of increased pressure in the pelvis. Increased watery or bloody mucus discharge from the vagina. Water breaking (ruptured amniotic sac). How is this diagnosed? This condition is diagnosed based on: Your medical history and a physical exam. A pelvic exam. An ultrasound. Monitoring your uterus for contractions. Other tests, including: A swab of the cervix to check for a chemical called fetal fibronectin. Urine tests. How is this treated? Treatment for this condition depends on the length of your pregnancy, your condition, and the health of your baby. Treatment may include: Taking medicines, such as: Hormone medicines. These may be given early in pregnancy to help support the pregnancy. Medicines to stop  contractions. Medicines to help mature the baby's lungs. These may be prescribed if the risk of  delivery is high. Medicines to help protect your baby from brain and nerve complications such as cerebral palsy. Bed rest. If the labor happens before 34 weeks of pregnancy, you may need to stay in the hospital. Delivery of the baby. Follow these instructions at home:  Do not use any products that contain nicotine or tobacco. These products include cigarettes, chewing tobacco, and vaping devices, such as e-cigarettes. If you need help quitting, ask your health care provider. Do not drink alcohol. Take over-the-counter and prescription medicines only as told by your health care provider. Rest as told by your health care provider. Return to your normal activities as told by your health care provider. Ask your health care provider what activities are safe for you. Keep all follow-up visits. This is important. How is this prevented? To increase your chance of having a full-term pregnancy: Do not use drugs or take medicines that have not been prescribed to you during your pregnancy. Talk with your health care provider before taking any herbal supplements, even if you have been taking them regularly. Make sure you gain a healthy amount of weight during your pregnancy. Watch for infection. If you think that you might have an infection, get it checked right away. Symptoms of infection may include: Fever. Abnormal vaginal discharge or discharge that smells bad. Pain or burning with urination. Needing to urinate urgently. Frequently urinating or passing small amounts of urine frequently. Blood in your urine or urine that smells bad or unusual. Where to find more information U.S. Department of Health and [place], systems on Women's Health: [place]BeachSigns.tn [employer] of Obstetricians and Gynecologists: www.acog.org Centers for Disease Control and Prevention, Preterm Birth: http://www.wolf.info/ Contact a health care provider if: You think you are going into preterm labor. You have signs  or symptoms of preterm labor. You have symptoms of infection. Get help right away if: You are having regular, painful contractions every 5 minutes or less. Your water breaks. Summary Preterm labor is labor that starts before you reach 37 weeks of pregnancy. Delivering your baby early increases your baby's risk of developing long-term problems. You are more likely to have preterm labor if you have certain risk factors that relate to your medical history, problems with present and past pregnancies, and lifestyle factors. Keep all follow-up visits. This is important. Contact a health care provider if you have signs or symptoms of preterm labor. This information is not intended to replace advice given to you by your health care provider. Make sure you discuss any questions you have with your health care provider. Document Revised: 04/13/2020 Document Reviewed: 04/13/2020 Elsevier Patient Education  [place].       Oral Glucose Tolerance Test During Pregnancy Why am I having this test? The oral glucose tolerance test (OGTT) is done to check how your body processes blood sugar (glucose). This is one of several tests used to diagnose diabetes that develops during pregnancy (gestational diabetes mellitus). Gestational diabetes is a short-term form of diabetes that some women develop while they are pregnant. It usually occurs during the second trimester of pregnancy and goes away after delivery. Testing, or screening, for gestational diabetes usually occurs at weeks 24-28 of pregnancy. You may have the OGTT test after having a 1-hour glucose screening test if the results from that test indicate that you may have gestational diabetes. This test may also be needed if: You  have a history of gestational diabetes. There is a history of giving birth to very large babies or of losing pregnancies (having stillbirths). You have signs and symptoms of diabetes, such as: Changes in your  eyesight. Tingling or numbness in your hands or feet. Changes in hunger, thirst, and urination, and these are not explained by your pregnancy. What is being tested? This test measures the amount of glucose in your blood at different times during a period of 3 hours. This shows how well your body can process glucose. What kind of sample is taken? Blood samples are required for this test. They are usually collected by inserting a needle into a blood vessel. How do I prepare for this test? For 3 days before your test, eat normally. Have plenty of carbohydrate-rich foods. Follow instructions from your health care provider about: Eating or drinking restrictions on the day of the test. You may be asked not to eat or drink anything other than water (to fast) starting 8-10 hours before the test. Changing or stopping your regular medicines. Some medicines may interfere with this test. Tell a health care provider about: All medicines you are taking, including vitamins, herbs, eye drops, creams, and over-the-counter medicines. Any blood disorders you have. Any surgeries you have had. Any medical conditions you have. What happens during the test? First, your blood glucose will be measured. This is referred to as your fasting blood glucose because you fasted before the test. Then, you will drink a glucose solution that contains a certain amount of glucose. Your blood glucose will be measured again 1, 2, and 3 hours after you drink the solution. This test takes about 3 hours to complete. You will need to stay at the testing location during this time. During the testing period: Do not eat or drink anything other than the glucose solution. Do not exercise. Do not use any products that contain nicotine or tobacco, such as cigarettes, e-cigarettes, and chewing tobacco. These can affect your test results. If you need help quitting, ask your health care provider. The testing procedure may vary among health care  providers and hospitals. How are the results reported? Your results will be reported as milligrams of glucose per deciliter of blood (mg/dL) or millimoles per liter (mmol/L). There is more than one source for screening and diagnosis reference values used to diagnose gestational diabetes. Your health care provider will compare your results to normal values that were established after testing a large group of people (reference values). Reference values may vary among labs and hospitals. For this test (Carpenter-Coustan), reference values are: Fasting: 95 mg/dL (5.3 mmol/L). 1 hour: 180 mg/dL (10.0 mmol/L). 2 hour: 155 mg/dL (8.6 mmol/L). 3 hour: 140 mg/dL (7.8 mmol/L). What do the results mean? Results below the reference values are considered normal. If two or more of your blood glucose levels are at or above the reference values, you may be diagnosed with gestational diabetes. If only one level is high, your health care provider may suggest repeat testing or other tests to confirm a diagnosis. Talk with your health care provider about what your results mean. Questions to ask your health care provider Ask your health care provider, or the department that is doing the test: When will my results be ready? How will I get my results? What are my treatment options? What other tests do I need? What are my next steps? Summary The oral glucose tolerance test (OGTT) is one of several tests used to diagnose diabetes that develops  during pregnancy (gestational diabetes mellitus). Gestational diabetes is a short-term form of diabetes that some women develop while they are pregnant. You may have the OGTT test after having a 1-hour glucose screening test if the results from that test show that you may have gestational diabetes. You may also have this test if you have any symptoms or risk factors for this type of diabetes. Talk with your health care provider about what your results mean. This information is not  intended to replace advice given to you by your health care provider. Make sure you discuss any questions you have with your health care provider. Document Revised: 09/18/2019 Document Reviewed: 09/18/2019 Elsevier Patient Education  2022 [employer] Sleep Information, Adult Quality sleep is important for your mental and physical health. It also improves your quality of life. Quality sleep means you: Are asleep for most of the time you are in bed. Fall asleep within 30 minutes. Wake up no more than once a night.  Are awake for no longer than 20 minutes if you do wake up during the night. Most adults need 7-8 hours of quality sleep each night. How can poor sleep affect me? If you do not get enough quality sleep, you may have: Mood swings. Daytime sleepiness. Confusion. Decreased reaction time. Sleep disorders, such as insomnia and sleep apnea. Difficulty with: Solving problems. Coping with stress. Paying attention. These issues may affect your performance and productivity at work, school, and at home. Lack of sleep may also put you at higher risk for accidents, suicide, and risky behaviors. If you do not get quality sleep you may also be at higher risk for several health problems, including: Infections. Type 2 diabetes. Heart disease. High blood pressure. Obesity. Worsening of long-term conditions, like arthritis, kidney disease, depression, Parkinson's disease, and epilepsy. What actions can I take to get more quality sleep?   Stick to a sleep schedule. Go to sleep and wake up at about the same time each day. Do not try to sleep less on weekdays and make up for lost sleep on weekends. This does not work. Try to get about 30 minutes of exercise on most days. Do not exercise 2-3 hours before going to bed. Limit naps during the day to 30 minutes or less. Do not use any products that contain nicotine or tobacco, such as cigarettes or e-cigarettes. If you need help  quitting, ask your health care provider. Do not drink caffeinated beverages for at least 8 hours before going to bed. Coffee, tea, and some sodas contain caffeine. Do not drink alcohol close to bedtime. Do not eat large meals close to bedtime. Do not take naps in the late afternoon. Try to get at least 30 minutes of sunlight every day. Morning sunlight is best. Make time to relax before bed. Reading, listening to music, or taking a hot bath promotes quality sleep. Make your bedroom a place that promotes quality sleep. Keep your bedroom dark, quiet, and at a comfortable room temperature. Make sure your bed is comfortable. Take out sleep distractions like TV, a computer, smartphone, and bright lights. If you are lying awake in bed for longer than 20 minutes, get up and do a relaxing activity until you feel sleepy. Work with your health care provider to treat medical conditions that may affect sleeping, such as: Nasal obstruction. Snoring. Sleep apnea and other sleep disorders. Talk to your health care provider if you think any of your prescription medicines may  cause you to have difficulty falling or staying asleep. If you have sleep problems, talk with a sleep consultant. If you think you have a sleep disorder, talk with your health care provider about getting evaluated by a specialist. Where to find more information [place] website: https://sleepfoundation.org National Heart, Lung, and [place] ([place]): http://www.saunders.info/.pdf Centers for Disease Control and Prevention (CDC): LearningDermatology.pl Contact a health care provider if you: Have trouble getting to sleep or staying asleep. Often wake up very early in the morning and cannot get back to sleep. Have daytime sleepiness. Have daytime sleep attacks of suddenly falling asleep and sudden muscle weakness (narcolepsy). Have a tingling sensation in your legs with a strong  urge to move your legs (restless legs syndrome). Stop breathing briefly during sleep (sleep apnea). Think you have a sleep disorder or are taking a medicine that is affecting your quality of sleep. Summary Most adults need 7-8 hours of quality sleep each night. Getting enough quality sleep is an important part of health and well-being. Make your bedroom a place that promotes quality sleep and avoid things that may cause you to have poor sleep, such as alcohol, caffeine, smoking, and large meals. Talk to your health care provider if you have trouble falling asleep or staying asleep. This information is not intended to replace advice given to you by your health care provider. Make sure you discuss any questions you have with your health care provider. Document Revised: 07/18/2017 Document Reviewed: 07/18/2017 Elsevier Patient Education  2022 [place]???  You must attend a Doren Custard class at [facility] 3rd Wednesday of every month from [employer] by calling [phone] or online at [email or web address].[email or web address] Bring [place] the certificate from the class  Waterbirth supplies needed for [facility]/Health Department patients: Our practice has a Heritage manager in a Box tub at the hospital that you can borrow You will need to purchase an accessory kit that has all needed supplies through [facility] [phone]) or online $175.00 Or you can purchase the supplies separately: Single-use disposable tub liner for Birth Pool in a Box (REGULAR size) New garden hose labeled "lead-free", "suitable for drinking water", Electric drain pump to remove water (We recommend 792 gallon per hour or greater pump.)   "non-toxic" OR "water potable" Garden hose to remove the dirty water Fish net Bathing suit top (optional) Long-handled mirror (optional) [employer] sells tubs for ~ $120 if you would rather purchase  your own tub.  They also sell accessories, liners.   Www.waterbirthsolutions.com for tub purchases and supplies The [employer] Ladies (www.thelaborladies.com) $275 for tub rental/set-up & take down/kit  [employer] information regarding doulas (labor support) who provide pool rentals:  IdentityList.se.htm  The [employer] (www.thelaborladies.com) IdentityList.se.htm   Things that would prevent you from having a waterbirth: Premature, <37wks Previous cesarean birth Presence of thick meconium-stained fluid Multiple gestation (Twins, triplets, etc.) Uncontrolled diabetes or gestational diabetes requiring medication Hypertension Heavy vaginal bleeding Non-reassuring fetal heart rate Active infection (MRSA, etc.) If your labor has to be induced and induction method requires continuous monitoring of the baby's heart rate Other risks/issues identified by your obstetrical provider

## 2021-02-17 ENCOUNTER — Encounter: Payer: Self-pay | Admitting: *Deleted

## 2021-02-17 ENCOUNTER — Ambulatory Visit: Payer: Medicaid Other | Admitting: *Deleted

## 2021-02-17 ENCOUNTER — Other Ambulatory Visit: Payer: Self-pay

## 2021-02-17 ENCOUNTER — Ambulatory Visit: Payer: Medicaid Other | Attending: Obstetrics

## 2021-02-17 ENCOUNTER — Other Ambulatory Visit: Payer: Self-pay | Admitting: *Deleted

## 2021-02-17 VITALS — BP 130/62 | HR 85

## 2021-02-17 DIAGNOSIS — Z3A25 25 weeks gestation of pregnancy: Secondary | ICD-10-CM | POA: Diagnosis not present

## 2021-02-17 DIAGNOSIS — O99212 Obesity complicating pregnancy, second trimester: Secondary | ICD-10-CM | POA: Diagnosis not present

## 2021-02-17 DIAGNOSIS — Z3481 Encounter for supervision of other normal pregnancy, first trimester: Secondary | ICD-10-CM

## 2021-02-17 DIAGNOSIS — Z6841 Body Mass Index (BMI) 40.0 and over, adult: Secondary | ICD-10-CM

## 2021-02-17 DIAGNOSIS — Z362 Encounter for other antenatal screening follow-up: Secondary | ICD-10-CM | POA: Insufficient documentation

## 2021-02-17 DIAGNOSIS — O99210 Obesity complicating pregnancy, unspecified trimester: Secondary | ICD-10-CM

## 2021-02-17 DIAGNOSIS — E889 Metabolic disorder, unspecified: Secondary | ICD-10-CM

## 2021-03-02 ENCOUNTER — Encounter: Payer: Self-pay | Admitting: Women's Health

## 2021-03-02 ENCOUNTER — Ambulatory Visit (INDEPENDENT_AMBULATORY_CARE_PROVIDER_SITE_OTHER): Payer: Medicaid Other | Admitting: Women's Health

## 2021-03-02 ENCOUNTER — Other Ambulatory Visit: Payer: Self-pay

## 2021-03-02 VITALS — BP 123/76 | HR 93 | Wt 315.0 lb

## 2021-03-02 DIAGNOSIS — O3662X0 Maternal care for excessive fetal growth, second trimester, not applicable or unspecified: Secondary | ICD-10-CM

## 2021-03-02 DIAGNOSIS — O99210 Obesity complicating pregnancy, unspecified trimester: Secondary | ICD-10-CM

## 2021-03-02 DIAGNOSIS — Z3480 Encounter for supervision of other normal pregnancy, unspecified trimester: Secondary | ICD-10-CM

## 2021-03-02 DIAGNOSIS — Z3A27 27 weeks gestation of pregnancy: Secondary | ICD-10-CM

## 2021-03-02 DIAGNOSIS — R030 Elevated blood-pressure reading, without diagnosis of hypertension: Secondary | ICD-10-CM

## 2021-03-02 DIAGNOSIS — O3660X0 Maternal care for excessive fetal growth, unspecified trimester, not applicable or unspecified: Secondary | ICD-10-CM | POA: Insufficient documentation

## 2021-03-02 DIAGNOSIS — Z3481 Encounter for supervision of other normal pregnancy, first trimester: Secondary | ICD-10-CM

## 2021-03-02 NOTE — Progress Notes (Signed)
Subjective:  Joan Dawson is a 30 y.o. G2P1001 at [redacted]w[redacted]d being seen today for ongoing prenatal care.  She is currently monitored for the following issues for this low-risk pregnancy and has History of migraine; Right knee pain; Supervision of other normal pregnancy, antepartum; Obesity in pregnancy; Elevated blood pressure reading in office without diagnosis of hypertension; and LGA (large for gestational age) fetus affecting management of mother on their problem list.  Patient reports no complaints.  Contractions: Not present. Vag. Bleeding: None.  Movement: Present. Denies leaking of fluid.   The following portions of the patient's history were reviewed and updated as appropriate: allergies, current medications, past family history, past medical history, past social history, past surgical history and problem list. Problem list updated.  Objective:   Vitals:   03/02/21 0906  BP: 123/76  Pulse: 93  Weight: (!) 315 lb (142.9 kg)    Fetal Status: Fetal Heart Rate (bpm): 149    Movement: Present     General:  Alert, oriented and cooperative. Patient is in no acute distress.  Skin: Skin is warm and dry. No rash noted.   Cardiovascular: Normal heart rate noted  Respiratory: Normal respiratory effort, no problems with respiration noted  Abdomen: Soft, gravid, appropriate for gestational age. Pain/Pressure: Present     Pelvic: Vag. Bleeding: None     Cervical exam deferred        Extremities: Normal range of motion.  Edema: None  Mental Status: Normal mood and affect. Normal behavior. Normal judgment and thought content.   Urinalysis:      Assessment and Plan:  Pregnancy: G2P1001 at [redacted]w[redacted]d  1. Supervision of other normal pregnancy, antepartum - Glucose Tolerance, 2 Hours w/1 Hour - CBC - RPR - HIV Antibody (routine testing w rflx)  PHQ9 SCORE ONLY 03/02/2021 10/27/2020  PHQ-9 Total Score 2 2   GAD 7 : Generalized Anxiety Score 03/02/2021 10/27/2020  Nervous, Anxious, on Edge 0 0   Control/stop worrying 0 0  Worry too much - different things 0 0  Trouble relaxing 0 0  Restless 0 0  Easily annoyed or irritable 1 1  Afraid - awful might happen 0 1  Total GAD 7 Score 1 2  Anxiety Difficulty Not difficult at all -   2. [redacted] weeks gestation of pregnancy  3. Encounter for supervision of other normal pregnancy in first trimester  4. Elevated blood pressure reading in office without diagnosis of hypertension -BP today 123/76  5. Obesity in pregnancy -weekly testing at 34 weeks per MFM  Preterm labor symptoms and general obstetric precautions including but not limited to vaginal bleeding, contractions, leaking of fluid and fetal movement were reviewed in detail with the patient. I discussed the assessment and treatment plan with the patient. The patient was provided an opportunity to ask questions and all were answered. The patient agreed with the plan and demonstrated an understanding of the instructions. The patient was advised to call back or seek an in-person office evaluation/go to MAU at [facility] for any urgent or concerning symptoms. Please refer to After Visit Summary for other counseling recommendations.  Return in about 3 weeks (around 03/23/2021) for in-person LOB/MIDWIFE ONLY.   Adrik Khim, Odie Sera, NP

## 2021-03-02 NOTE — Progress Notes (Signed)
Pt presents for ROB and 2 gtt labs. Tdap and flu offered; pt declined both.   PHQ9=2 GAD7= 1 Next growth u/s scheduled 03/24/21

## 2021-03-02 NOTE — Patient Instructions (Signed)
Maternity Assessment Unit (MAU)  The Maternity Assessment Unit (MAU) is located at the [facility]. The address is: [address], [address], [place], [place] [address]. Please see map below for additional directions.    The Maternity Assessment Unit is designed to help you during your pregnancy, and for up to 6 weeks after delivery, with any pregnancy- or postpartum-related emergencies, if you think you are in labor, or if your water has broken. For example, if you experience nausea and vomiting, vaginal bleeding, severe abdominal or pelvic pain, elevated blood pressure or other problems related to your pregnancy or postpartum time, please come to the Maternity Assessment Unit for assistance.       Preterm Labor The normal length of a pregnancy is 39-41 weeks. Preterm labor is when labor starts before 37 completed weeks of pregnancy. Babies who are born prematurely and survive may not be fully developed and may be at an increased risk for long-term problems such as cerebral palsy, developmental delays, and vision andhearing problems. Babies who are born too early may have problems soon after birth. Premature babies may have problems regulating blood sugar, body temperature, heart rate, and breathing rate. These babies often have trouble with feeding. The risk ofhaving problems is highest for babies who are born before 34 weeks of pregnancy. What are the causes? The exact cause of this condition is not known. What increases the risk? You are more likely to have preterm labor if you have certain risk factors that relate to your medical history, problems with present and past pregnancies, andlifestyle factors. Medical history You have abnormalities of the uterus, including a short cervix. You have STIs (sexually transmitted infections) or other infections of the urinary tract and the vagina. You have chronic illnesses, such as blood clotting  problems, diabetes, or high blood pressure. You are overweight or underweight. Present and past pregnancies You have had preterm labor before. You are pregnant with twins or other multiples. You have been diagnosed with a condition in which the placenta covers your cervix (placenta previa). You waited less than 18 months between giving birth and becoming pregnant again. Your unborn baby has some abnormalities. You have vaginal bleeding during pregnancy. You became pregnant through in vitro fertilization (IVF). Lifestyle and environmental factors You use tobacco products or drink alcohol. You use drugs. You have stress and no social support. You experience domestic violence. You are exposed to certain chemicals or environmental pollutants. Other factors You are younger than age 17 or older than age 35. What are the signs or symptoms? Symptoms of this condition include: Cramps similar to those that can happen during a menstrual period. The cramps may happen with diarrhea. Pain in the abdomen or lower back. Regular contractions that may feel like tightening of the abdomen. A feeling of increased pressure in the pelvis. Increased watery or bloody mucus discharge from the vagina. Water breaking (ruptured amniotic sac). How is this diagnosed? This condition is diagnosed based on: Your medical history and a physical exam. A pelvic exam. An ultrasound. Monitoring your uterus for contractions. Other tests, including: A swab of the cervix to check for a chemical called fetal fibronectin. Urine tests. How is this treated? Treatment for this condition depends on the length of your pregnancy, your condition, and the health of your baby. Treatment may include: Taking medicines, such as: Hormone medicines. These may be given early in pregnancy to help support the pregnancy. Medicines to stop contractions. Medicines to   help mature the baby's lungs. These may be prescribed if the risk of  delivery is high. Medicines to help protect your baby from brain and nerve complications such as cerebral palsy. Bed rest. If the labor happens before 34 weeks of pregnancy, you may need to stay in the hospital. Delivery of the baby. Follow these instructions at home:  Do not use any products that contain nicotine or tobacco. These products include cigarettes, chewing tobacco, and vaping devices, such as e-cigarettes. If you need help quitting, ask your health care provider. Do not drink alcohol. Take over-the-counter and prescription medicines only as told by your health care provider. Rest as told by your health care provider. Return to your normal activities as told by your health care provider. Ask your health care provider what activities are safe for you. Keep all follow-up visits. This is important. How is this prevented? To increase your chance of having a full-term pregnancy: Do not use drugs or take medicines that have not been prescribed to you during your pregnancy. Talk with your health care provider before taking any herbal supplements, even if you have been taking them regularly. Make sure you gain a healthy amount of weight during your pregnancy. Watch for infection. If you think that you might have an infection, get it checked right away. Symptoms of infection may include: Fever. Abnormal vaginal discharge or discharge that smells bad. Pain or burning with urination. Needing to urinate urgently. Frequently urinating or passing small amounts of urine frequently. Blood in your urine or urine that smells bad or unusual. Where to find more information U.S. Department of Health and Human Services Office on Women's Health: www.womenshealth.gov The American College of Obstetricians and Gynecologists: www.acog.org Centers for Disease Control and Prevention, Preterm Birth: www.cdc.gov Contact a health care provider if: You think you are going into preterm labor. You have signs  or symptoms of preterm labor. You have symptoms of infection. Get help right away if: You are having regular, painful contractions every 5 minutes or less. Your water breaks. Summary Preterm labor is labor that starts before you reach 37 weeks of pregnancy. Delivering your baby early increases your baby's risk of developing long-term problems. You are more likely to have preterm labor if you have certain risk factors that relate to your medical history, problems with present and past pregnancies, and lifestyle factors. Keep all follow-up visits. This is important. Contact a health care provider if you have signs or symptoms of preterm labor. This information is not intended to replace advice given to you by your health care provider. Make sure you discuss any questions you have with your healthcare provider. Document Revised: 04/13/2020 Document Reviewed: 04/13/2020 Elsevier Patient Education  2022 Elsevier Inc.       Oral Glucose Tolerance Test During Pregnancy Why am I having this test? The oral glucose tolerance test (OGTT) is done to check how your body processes blood sugar (glucose). This is one of several tests used to diagnose diabetes that develops during pregnancy (gestational diabetes mellitus). Gestational diabetes is a short-term form of diabetes that some women develop while they are pregnant. It usually occurs during the second trimesterof pregnancy and goes away after delivery. Testing, or screening, for gestational diabetes usually occurs at weeks 24-28 of pregnancy. You may have the OGTT test after having a 1-hour glucose screening test if the results from that test indicate that you may have gestational diabetes. This test may also be needed if: You have a history of gestational   diabetes. There is a history of giving birth to very large babies or of losing pregnancies (having stillbirths). You have signs and symptoms of diabetes, such as: Changes in your  eyesight. Tingling or numbness in your hands or feet. Changes in hunger, thirst, and urination, and these are not explained by your pregnancy. What is being tested? This test measures the amount of glucose in your blood at different timesduring a period of 3 hours. This shows how well your body can process glucose. What kind of sample is taken?  Blood samples are required for this test. They are usually collected byinserting a needle into a blood vessel. How do I prepare for this test? For 3 days before your test, eat normally. Have plenty of carbohydrate-rich foods. Follow instructions from your health care provider about: Eating or drinking restrictions on the day of the test. You may be asked not to eat or drink anything other than water (to fast) starting 8-10 hours before the test. Changing or stopping your regular medicines. Some medicines may interfere with this test. Tell a health care provider about: All medicines you are taking, including vitamins, herbs, eye drops, creams, and over-the-counter medicines. Any blood disorders you have. Any surgeries you have had. Any medical conditions you have. What happens during the test? First, your blood glucose will be measured. This is referred to as your fasting blood glucose because you fasted before the test. Then, you will drink a glucose solution that contains a certain amount of glucose. Your blood glucosewill be measured again 1, 2, and 3 hours after you drink the solution. This test takes about 3 hours to complete. You will need to stay at the testing location during this time. During the testing period: Do not eat or drink anything other than the glucose solution. Do not exercise. Do not use any products that contain nicotine or tobacco, such as cigarettes, e-cigarettes, and chewing tobacco. These can affect your test results. If you need help quitting, ask your health care provider. The testing procedure may vary among health care  providers and hospitals. How are the results reported? Your results will be reported as milligrams of glucose per deciliter of blood (mg/dL) or millimoles per liter (mmol/L). There is more than one source for screening and diagnosis reference values used to diagnose gestational diabetes. Your health care provider will compare your results to normal values that were established after testing a large group of people (reference values). Reference values may vary among labs and hospitals. For this test (Carpenter-Coustan), reference values are: Fasting: 95 mg/dL (5.3 mmol/L). 1 hour: 180 mg/dL (10.0 mmol/L). 2 hour: 155 mg/dL (8.6 mmol/L). 3 hour: 140 mg/dL (7.8 mmol/L). What do the results mean? Results below the reference values are considered normal. If two or more of your blood glucose levels are at or above the reference values, you may be diagnosed with gestational diabetes. If only one level is high, your healthcare provider may suggest repeat testing or other tests to confirm a diagnosis. Talk with your health care provider about what your results mean. Questions to ask your health care provider Ask your health care provider, or the department that is doing the test: When will my results be ready? How will I get my results? What are my treatment options? What other tests do I need? What are my next steps? Summary The oral glucose tolerance test (OGTT) is one of several tests used to diagnose diabetes that develops during pregnancy (gestational diabetes mellitus). Gestational diabetes is   a short-term form of diabetes that some women develop while they are pregnant. You may have the OGTT test after having a 1-hour glucose screening test if the results from that test show that you may have gestational diabetes. You may also have this test if you have any symptoms or risk factors for this type of diabetes. Talk with your health care provider about what your results mean. This information is not  intended to replace advice given to you by your health care provider. Make sure you discuss any questions you have with your healthcare provider. Document Revised: 09/18/2019 Document Reviewed: 09/18/2019 Elsevier Patient Education  2022 Elsevier Inc.  

## 2021-03-03 LAB — CBC
Hematocrit: 37.2 % (ref 34.0–46.6)
Hemoglobin: 12.3 g/dL (ref 11.1–15.9)
MCH: 30.8 pg (ref 26.6–33.0)
MCHC: 33.1 g/dL (ref 31.5–35.7)
MCV: 93 fL (ref 79–97)
Platelets: 229 10*3/uL (ref 150–450)
RBC: 3.99 x10E6/uL (ref 3.77–5.28)
RDW: 11.6 % — ABNORMAL LOW (ref 11.7–15.4)
WBC: 8.9 10*3/uL (ref 3.4–10.8)

## 2021-03-03 LAB — GLUCOSE TOLERANCE, 2 HOURS W/ 1HR
Glucose, 1 hour: 122 mg/dL (ref 70–179)
Glucose, 2 hour: 106 mg/dL (ref 70–152)
Glucose, Fasting: 74 mg/dL (ref 70–91)

## 2021-03-03 LAB — RPR: RPR Ser Ql: NONREACTIVE

## 2021-03-03 LAB — HIV ANTIBODY (ROUTINE TESTING W REFLEX): HIV Screen 4th Generation wRfx: NONREACTIVE

## 2021-03-21 ENCOUNTER — Ambulatory Visit (INDEPENDENT_AMBULATORY_CARE_PROVIDER_SITE_OTHER): Payer: Medicaid Other | Admitting: Advanced Practice Midwife

## 2021-03-21 ENCOUNTER — Other Ambulatory Visit: Payer: Self-pay

## 2021-03-21 VITALS — BP 128/74 | HR 96 | Wt 319.0 lb

## 2021-03-21 DIAGNOSIS — Z3493 Encounter for supervision of normal pregnancy, unspecified, third trimester: Secondary | ICD-10-CM

## 2021-03-21 DIAGNOSIS — O3663X0 Maternal care for excessive fetal growth, third trimester, not applicable or unspecified: Secondary | ICD-10-CM

## 2021-03-21 DIAGNOSIS — O99210 Obesity complicating pregnancy, unspecified trimester: Secondary | ICD-10-CM

## 2021-03-21 DIAGNOSIS — R030 Elevated blood-pressure reading, without diagnosis of hypertension: Secondary | ICD-10-CM

## 2021-03-21 NOTE — Progress Notes (Signed)
   PRENATAL VISIT NOTE  Subjective:  Joan Dawson is a 30 y.o. G2P1001 at [redacted]w[redacted]d being seen today for ongoing prenatal care.  She is currently monitored for the following issues for this low-risk pregnancy and has History of migraine; Right knee pain; Supervision of other normal pregnancy, antepartum; Obesity in pregnancy; Elevated blood pressure reading in office without diagnosis of hypertension; and LGA (large for gestational age) fetus affecting management of mother on their problem list.  Patient reports no complaints.  Contractions: Not present. Vag. Bleeding: None.  Movement: Present. Denies leaking of fluid.   The following portions of the patient's history were reviewed and updated as appropriate: allergies, current medications, past family history, past medical history, past social history, past surgical history and problem list.   Objective:   Vitals:   03/21/21 1335  BP: 128/74  Pulse: 96  Weight: (!) 319 lb (144.7 kg)    Fetal Status: Fetal Heart Rate (bpm): 138 Fundal Height: 32 cm Movement: Present     General:  Alert, oriented and cooperative. Patient is in no acute distress.  Skin: Skin is warm and dry. No rash noted.   Cardiovascular: Normal heart rate noted  Respiratory: Normal respiratory effort, no problems with respiration noted  Abdomen: Soft, gravid, appropriate for gestational age.  Pain/Pressure: Absent     Pelvic: Cervical exam deferred        Extremities: Normal range of motion.  Edema: Trace  Mental Status: Normal mood and affect. Normal behavior. Normal judgment and thought content.   Assessment and Plan:  Pregnancy: G2P1001 at [redacted]w[redacted]d 1. Supervision of low risk pregnancy, antepartum --Anticipatory guidance about next visits/weeks of pregnancy given. --Next visit in 2 weeks with midwife   2. Elevated blood pressure reading in office without diagnosis of hypertension --Pt with hx white coat syndrome.  One BP elevated in early pregnancy on home cuff  but repeat wnl, then elevated BP in office x 1 with retake also wnl.  --BP wnl today --Watch  BP --Had a long conversation with patient today about HTN as contraindication to waterbirth.  Given isolated BPs followed by normal BP, will continue to watch but one more blood pressure at any time warrants a HTN diagnosis, likely CHTN.  3. Obesity in pregnancy   4. Excessive fetal growth affecting management of pregnancy in third trimester, single or unspecified fetus --Pt had mild shoulder dystocia with 9+ lb baby with first pregnancy --Reviewed LGA and need for normal labor curve, or concerns about dystocia  Preterm labor symptoms and general obstetric precautions including but not limited to vaginal bleeding, contractions, leaking of fluid and fetal movement were reviewed in detail with the patient. Please refer to After Visit Summary for other counseling recommendations.   Return in about 2 weeks (around 04/04/2021).  Future Appointments  Date Time Provider Department Center  03/24/2021  7:45 AM [facility] NURSE [facility]  03/24/2021  8:00 AM [facility] US1 [facility]  04/04/2021  1:50 PM Leftwich-Kirby, Wilmer Floor, CNM [facility] None    Sharen Counter, CNM

## 2021-03-21 NOTE — Progress Notes (Signed)
+   Fetal movement. No complaints.  

## 2021-03-24 ENCOUNTER — Ambulatory Visit: Payer: Medicaid Other | Admitting: *Deleted

## 2021-03-24 ENCOUNTER — Other Ambulatory Visit: Payer: Self-pay | Admitting: *Deleted

## 2021-03-24 ENCOUNTER — Ambulatory Visit: Payer: Medicaid Other | Attending: Women's Health

## 2021-03-24 ENCOUNTER — Other Ambulatory Visit: Payer: Self-pay

## 2021-03-24 ENCOUNTER — Encounter: Payer: Self-pay | Admitting: *Deleted

## 2021-03-24 VITALS — BP 124/68 | HR 74

## 2021-03-24 DIAGNOSIS — Z3480 Encounter for supervision of other normal pregnancy, unspecified trimester: Secondary | ICD-10-CM | POA: Diagnosis present

## 2021-03-24 DIAGNOSIS — O99213 Obesity complicating pregnancy, third trimester: Secondary | ICD-10-CM

## 2021-03-24 DIAGNOSIS — Z3A30 30 weeks gestation of pregnancy: Secondary | ICD-10-CM

## 2021-03-24 DIAGNOSIS — O99212 Obesity complicating pregnancy, second trimester: Secondary | ICD-10-CM

## 2021-03-24 DIAGNOSIS — Z362 Encounter for other antenatal screening follow-up: Secondary | ICD-10-CM | POA: Insufficient documentation

## 2021-03-24 DIAGNOSIS — Z6841 Body Mass Index (BMI) 40.0 and over, adult: Secondary | ICD-10-CM | POA: Diagnosis present

## 2021-03-24 DIAGNOSIS — O99210 Obesity complicating pregnancy, unspecified trimester: Secondary | ICD-10-CM

## 2021-03-24 DIAGNOSIS — E669 Obesity, unspecified: Secondary | ICD-10-CM | POA: Diagnosis not present

## 2021-04-04 ENCOUNTER — Other Ambulatory Visit: Payer: Self-pay

## 2021-04-04 ENCOUNTER — Ambulatory Visit (INDEPENDENT_AMBULATORY_CARE_PROVIDER_SITE_OTHER): Payer: Medicaid Other | Admitting: Advanced Practice Midwife

## 2021-04-04 VITALS — BP 128/82 | HR 90 | Wt 322.0 lb

## 2021-04-04 DIAGNOSIS — Z3493 Encounter for supervision of normal pregnancy, unspecified, third trimester: Secondary | ICD-10-CM

## 2021-04-04 DIAGNOSIS — R102 Pelvic and perineal pain: Secondary | ICD-10-CM

## 2021-04-04 DIAGNOSIS — O26893 Other specified pregnancy related conditions, third trimester: Secondary | ICD-10-CM

## 2021-04-04 DIAGNOSIS — Z3A31 31 weeks gestation of pregnancy: Secondary | ICD-10-CM

## 2021-04-04 NOTE — Progress Notes (Signed)
   PRENATAL VISIT NOTE  Subjective:  Joan Dawson is a 30 y.o. G2P1001 at [redacted]w[redacted]d being seen today for ongoing prenatal care.  She is currently monitored for the following issues for this low-risk pregnancy and has History of migraine; Right knee pain; Supervision of other normal pregnancy, antepartum; Obesity in pregnancy; Elevated blood pressure reading in office without diagnosis of hypertension; and LGA (large for gestational age) fetus affecting management of mother on their problem list.  Patient reports occasional contractions.  Contractions: Not present. Vag. Bleeding: None.  Movement: Present. Denies leaking of fluid.   The following portions of the patient's history were reviewed and updated as appropriate: allergies, current medications, past family history, past medical history, past social history, past surgical history and problem list.   Objective:   Vitals:   04/04/21 1336  BP: 128/82  Pulse: 90  Weight: (!) 322 lb (146.1 kg)    Fetal Status: Fetal Heart Rate (bpm): 159 Fundal Height: 34 cm Movement: Present     General:  Alert, oriented and cooperative. Patient is in no acute distress.  Skin: Skin is warm and dry. No rash noted.   Cardiovascular: Normal heart rate noted  Respiratory: Normal respiratory effort, no problems with respiration noted  Abdomen: Soft, gravid, appropriate for gestational age.  Pain/Pressure: Present     Pelvic: Cervical exam deferred        Extremities: Normal range of motion.  Edema: Trace  Mental Status: Normal mood and affect. Normal behavior. Normal judgment and thought content.   Assessment and Plan:  Pregnancy: G2P1001 at [redacted]w[redacted]d 1. Supervision of low-risk pregnancy, third trimester --Anticipatory guidance about next visits/weeks of pregnancy given. --Next visit in 2 weeks  2. [redacted] weeks gestation of pregnancy   3. Pelvic pressure in pregnancy, antepartum, third trimester --Rest/ice/heat/warm bath/increase PO fluids/Tylenol/pregnancy  support belt    Preterm labor symptoms and general obstetric precautions including but not limited to vaginal bleeding, contractions, leaking of fluid and fetal movement were reviewed in detail with the patient. Please refer to After Visit Summary for other counseling recommendations.   No follow-ups on file.  Future Appointments  Date Time Provider Department Center  04/21/2021  7:30 AM [facility] NURSE [facility]  04/21/2021  7:45 AM [facility] US5 [facility]  04/21/2021 10:35 AM Marny Lowenstein, PA-C [facility] None  04/28/2021  7:30 AM [facility] NURSE [facility]  04/28/2021  7:45 AM [facility] US5 [facility]  05/02/2021  8:35 AM Leftwich-Kirby, Wilmer Floor, CNM [facility] None    Sharen Counter, CNM

## 2021-04-21 ENCOUNTER — Encounter: Payer: Self-pay | Admitting: *Deleted

## 2021-04-21 ENCOUNTER — Encounter: Payer: Self-pay | Admitting: Medical

## 2021-04-21 ENCOUNTER — Ambulatory Visit: Payer: Medicaid Other | Attending: Obstetrics

## 2021-04-21 ENCOUNTER — Ambulatory Visit: Payer: Medicaid Other | Admitting: *Deleted

## 2021-04-21 ENCOUNTER — Other Ambulatory Visit: Payer: Self-pay | Admitting: *Deleted

## 2021-04-21 ENCOUNTER — Ambulatory Visit (INDEPENDENT_AMBULATORY_CARE_PROVIDER_SITE_OTHER): Payer: Medicaid Other | Admitting: Medical

## 2021-04-21 ENCOUNTER — Other Ambulatory Visit: Payer: Self-pay

## 2021-04-21 VITALS — BP 142/70 | HR 68

## 2021-04-21 VITALS — BP 131/80 | HR 70 | Wt 333.0 lb

## 2021-04-21 DIAGNOSIS — E669 Obesity, unspecified: Secondary | ICD-10-CM | POA: Diagnosis not present

## 2021-04-21 DIAGNOSIS — O3663X0 Maternal care for excessive fetal growth, third trimester, not applicable or unspecified: Secondary | ICD-10-CM | POA: Diagnosis present

## 2021-04-21 DIAGNOSIS — O99213 Obesity complicating pregnancy, third trimester: Secondary | ICD-10-CM | POA: Insufficient documentation

## 2021-04-21 DIAGNOSIS — Z362 Encounter for other antenatal screening follow-up: Secondary | ICD-10-CM | POA: Insufficient documentation

## 2021-04-21 DIAGNOSIS — O99210 Obesity complicating pregnancy, unspecified trimester: Secondary | ICD-10-CM

## 2021-04-21 DIAGNOSIS — Z3480 Encounter for supervision of other normal pregnancy, unspecified trimester: Secondary | ICD-10-CM

## 2021-04-21 DIAGNOSIS — R030 Elevated blood-pressure reading, without diagnosis of hypertension: Secondary | ICD-10-CM

## 2021-04-21 DIAGNOSIS — Z3A34 34 weeks gestation of pregnancy: Secondary | ICD-10-CM | POA: Insufficient documentation

## 2021-04-21 NOTE — Progress Notes (Signed)
PRENATAL VISIT NOTE  Subjective:  Joan Dawson is a 30 y.o. G2P1001 at [redacted]w[redacted]d being seen today for ongoing prenatal care.  She is currently monitored for the following issues for this high-risk pregnancy and has History of migraine; Right knee pain; Supervision of other normal pregnancy, antepartum; Obesity in pregnancy; Elevated blood pressure reading in office without diagnosis of hypertension; and LGA (large for gestational age) fetus affecting management of mother on their problem list.  Patient reports difficulty sleeping.  Contractions: Not present. Vag. Bleeding: None.  Movement: Present. Denies leaking of fluid.   The following portions of the patient's history were reviewed and updated as appropriate: allergies, current medications, past family history, past medical history, past social history, past surgical history and problem list.   Objective:   Vitals:   04/21/21 0950  BP: 131/80  Pulse: 70  Weight: (!) 333 lb (151 kg)    Fetal Status: Fetal Heart Rate (bpm): 135   Movement: Present     General:  Alert, oriented and cooperative. Patient is in no acute distress.  Skin: Skin is warm and dry. No rash noted.   Cardiovascular: Normal heart rate noted  Respiratory: Normal respiratory effort, no problems with respiration noted  Abdomen: Soft, gravid, appropriate for gestational age.  Pain/Pressure: Absent     Pelvic: Cervical exam deferred        Extremities: Normal range of motion.     Mental Status: Normal mood and affect. Normal behavior. Normal judgment and thought content.   Assessment and Plan:  Pregnancy: G2P1001 at [redacted]w[redacted]d 1. Supervision of other normal pregnancy, antepartum - Planning POPs for [facility] - Patient expressed difficulty sleeping, has tried pillows, avoiding caffeine, bath/shower at night, less water intake before bed, Benadryl and Unisom without success  - Advised to consider sleepytime tea and Mg supplement at bedtime, if those do not work, may need low  dose Rx  2. Excessive fetal growth affecting management of pregnancy in third trimester, single or unspecified fetus - EFW today was 95%  3. Elevated blood pressure reading in office without diagnosis of hypertension - Elevated BP in MFM today, mild range, normal on recheck in office here  - Patient had one previously elevated BP at 14 weeks  - Discussed with patient concern for [facility], however will defer to next visit for discussion about WB as BP is mildly elevated, intermittent, normal on recheck and patient is without HA or vision changes  4. Obesity in pregnancy - BPP per MFM weekly, next scheduled 04/28/21  5. [redacted] weeks gestation of pregnancy   Preterm labor symptoms and general obstetric precautions including but not limited to vaginal bleeding, contractions, leaking of fluid and fetal movement were reviewed in detail with the patient. Please refer to After Visit Summary for other counseling recommendations.   Return in about 2 weeks (around 05/05/2021) for [facility] APP, In-Person, any provider.  Future Appointments  Date Time Provider Department Center  04/21/2021 10:35 AM Marny Lowenstein, PA-C [facility] None  04/28/2021  7:30 AM [facility] NURSE [facility]  04/28/2021  7:45 AM [facility] US5 [facility]  05/02/2021  8:35 AM Leftwich-Kirby, Wilmer Floor, CNM [facility] None  05/05/2021  7:15 AM [facility] NURSE [facility]  05/05/2021  7:30 AM [facility] US2 [facility]  05/13/2021  7:30 AM [facility] NURSE [facility]  05/13/2021  7:45 AM [facility] US7 [facility]  05/20/2021  8:30 AM [facility] NURSE [facility]  05/20/2021  8:45 AM [facility] US6 [facility]    Raynelle Fanning  Hillard Danker, PA-C

## 2021-04-21 NOTE — Progress Notes (Signed)
Pt states she is not sleeping well.

## 2021-04-22 ENCOUNTER — Encounter: Payer: Self-pay | Admitting: Medical

## 2021-04-22 DIAGNOSIS — O133 Gestational [pregnancy-induced] hypertension without significant proteinuria, third trimester: Secondary | ICD-10-CM | POA: Insufficient documentation

## 2021-04-24 NOTE — L&D Delivery Note (Addendum)
LABOR COURSE Patient admitted for IOL for CHTN (not on medication). Her cervix was ripened with Cytotec and a foley balloon. She was augmented with Pitocin and AROM'd at 1844.  Delivery Note Called to room for delivery, patient in hands and knees with fetal head supported by RN x 2.  Remainder of delivery with provider at perineum.  At 2119 a viable female was delivered via Vaginal, Spontaneous (Presentation: LOA per RN report).  Infant with spontaneous cry, placed on mother's abdomen, dried and stimulated. Cord clamped x 2 after two-minute delay, and cut by FOB. Pitocin bolus initiated once cord clamped and cut. Cord blood drawn. Placenta delivered spontaneously with gentle cord traction. Appears intact. Large gush of blood during delivery of placenta. Verbal order for TXA. Explanation and indication provided to patient. Fundus firm with massage. Additional large clots passed with manual sweep. Labia, perineum, vagina, and cervix inspected with bilateral periureteral lacerations. Left periurethral not hemostatic, repair indicated.    APGAR: 9,9; weight: 3720g  .   Cord: 3VC with the following complications:N/A.    Anesthesia: Local lidocaine for perineal repair   Episiotomy: None Lacerations: bilateral periurethral with repair of left lac Suture Repair:  4.0 monocryl on SH Est. Blood Loss (mL): 1745   Dr. Roselie Awkward notified of [place]. QBL . Present at bedside s/p repair and medication interventions.  Mom to  [facility] for postpartum recovery on Magnesium Sulfate .  Baby to Couplet care / Skin to Skin.  CNM returned to bedside at 2225. Patient reporting abdominal pain immediately s/p fundal check by RN. Will give one-time dose of Oxycodone 10 mg. BP tending low, pulse normal. Stat CBC for PPH collected while CNM in room.   Mallie Snooks, CNM 05/21/21 12:41 AM

## 2021-04-26 ENCOUNTER — Telehealth: Payer: Self-pay | Admitting: Advanced Practice Midwife

## 2021-04-26 NOTE — Telephone Encounter (Signed)
Called pt to discuss recent HTN in office, and diagnosis of GHTN.  Unable to leave message.  Pt has appt at [facility] on 05/02/21 and will discuss GHTN and changes to plan of care at that time.

## 2021-04-28 ENCOUNTER — Ambulatory Visit: Payer: Medicaid Other

## 2021-04-28 ENCOUNTER — Other Ambulatory Visit: Payer: Medicaid Other

## 2021-04-29 ENCOUNTER — Encounter: Payer: Self-pay | Admitting: *Deleted

## 2021-04-29 ENCOUNTER — Ambulatory Visit (HOSPITAL_BASED_OUTPATIENT_CLINIC_OR_DEPARTMENT_OTHER): Payer: Medicaid Other

## 2021-04-29 ENCOUNTER — Ambulatory Visit: Payer: Medicaid Other | Attending: Obstetrics | Admitting: *Deleted

## 2021-04-29 ENCOUNTER — Other Ambulatory Visit: Payer: Self-pay

## 2021-04-29 VITALS — BP 144/68 | HR 72

## 2021-04-29 DIAGNOSIS — O99213 Obesity complicating pregnancy, third trimester: Secondary | ICD-10-CM | POA: Diagnosis present

## 2021-04-29 DIAGNOSIS — E669 Obesity, unspecified: Secondary | ICD-10-CM | POA: Diagnosis not present

## 2021-04-29 DIAGNOSIS — Z3A35 35 weeks gestation of pregnancy: Secondary | ICD-10-CM | POA: Diagnosis not present

## 2021-04-29 DIAGNOSIS — O133 Gestational [pregnancy-induced] hypertension without significant proteinuria, third trimester: Secondary | ICD-10-CM

## 2021-04-29 DIAGNOSIS — Z3480 Encounter for supervision of other normal pregnancy, unspecified trimester: Secondary | ICD-10-CM

## 2021-04-29 DIAGNOSIS — O99210 Obesity complicating pregnancy, unspecified trimester: Secondary | ICD-10-CM

## 2021-05-02 ENCOUNTER — Other Ambulatory Visit (HOSPITAL_COMMUNITY)
Admission: RE | Admit: 2021-05-02 | Discharge: 2021-05-02 | Disposition: A | Discharge status: Home / Self Care | Payer: Medicaid Other | Source: Ambulatory Visit | Attending: Advanced Practice Midwife | Admitting: Advanced Practice Midwife

## 2021-05-02 ENCOUNTER — Other Ambulatory Visit: Payer: Self-pay

## 2021-05-02 ENCOUNTER — Ambulatory Visit (INDEPENDENT_AMBULATORY_CARE_PROVIDER_SITE_OTHER): Payer: Medicaid Other | Admitting: Advanced Practice Midwife

## 2021-05-02 VITALS — BP 133/88 | HR 82 | Wt 333.0 lb

## 2021-05-02 DIAGNOSIS — Z3A35 35 weeks gestation of pregnancy: Secondary | ICD-10-CM

## 2021-05-02 DIAGNOSIS — Z3480 Encounter for supervision of other normal pregnancy, unspecified trimester: Secondary | ICD-10-CM

## 2021-05-02 DIAGNOSIS — O10913 Unspecified pre-existing hypertension complicating pregnancy, third trimester: Secondary | ICD-10-CM

## 2021-05-02 LAB — OB RESULTS CONSOLE GC/CHLAMYDIA: Gonorrhea: NEGATIVE

## 2021-05-02 NOTE — Patient Instructions (Signed)
Things to Try After 37 weeks to Encourage Labor/Get Ready for Labor:    Try the Miles Circuit at www.milescircuit.com daily to improve baby's position and encourage the onset of labor.  Walk a little and rest a little every day.  Change positions often.  Cervical Ripening: May try one or both Red Raspberry Leaf capsules or tea:  two 300mg or 400mg tablets with each meal, 2-3 times a day, or 1-3 cups of tea daily  Potential Side Effects Of Raspberry Leaf:  Most women do not experience any side effects from drinking raspberry leaf tea. However, nausea and loose stools are possible   Evening Primrose Oil capsules: take 1 capsule by mouth and place one capsule in the vagina every night.    Some of the potential side effects:  Upset stomach  Loose stools or diarrhea  Headaches  Nausea  Sex can also help the cervix ripen and encourage labor onset.   

## 2021-05-02 NOTE — Progress Notes (Signed)
° °  PRENATAL VISIT NOTE  Subjective:  Joan Dawson is a 31 y.o. G2P1001 at [redacted]w[redacted]d being seen today for ongoing prenatal care.  She is currently monitored for the following issues for this high-risk pregnancy and has History of migraine; Right knee pain; Supervision of other normal pregnancy, antepartum; Obesity in pregnancy; Elevated blood pressure reading in office without diagnosis of hypertension; LGA (large for gestational age) fetus affecting management of mother; and Gestational hypertension w/o significant proteinuria in 3rd trimester on their problem list.  Patient reports  intermittent pelvic pain, improves with position change .  Contractions: Not present. Vag. Bleeding: None.  Movement: Present. Denies leaking of fluid.   The following portions of the patient's history were reviewed and updated as appropriate: allergies, current medications, past family history, past medical history, past social history, past surgical history and problem list.   Objective:   Vitals:   05/02/21 0840  BP: 133/88  Pulse: 82  Weight: (!) 333 lb (151 kg)    Fetal Status: Fetal Heart Rate (bpm): 145 Fundal Height: 38 cm Movement: Present  Presentation: Vertex  General:  Alert, oriented and cooperative. Patient is in no acute distress.  Skin: Skin is warm and dry. No rash noted.   Cardiovascular: Normal heart rate noted  Respiratory: Normal respiratory effort, no problems with respiration noted  Abdomen: Soft, gravid, appropriate for gestational age.  Pain/Pressure: Present     Pelvic: Cervical exam performed in the presence of a chaperone Dilation: Closed Effacement (%): 0 Station: Ballotable  Extremities: Normal range of motion.     Mental Status: Normal mood and affect. Normal behavior. Normal judgment and thought content.   Assessment and Plan:  Pregnancy: G2P1001 at [redacted]w[redacted]d 1. Supervision of other normal pregnancy, antepartum --Anticipatory guidance about next visits/weeks of pregnancy  given. --Pt changed to high risk pregnancy with CHTN, see below --Return in 1 week  2. [redacted] weeks gestation of pregnancy   3. Chronic hypertension in obstetric context in third trimester --Multiple elevated BPs starting at 16 weeks, retake wnl.  --Phoebe Perch. Pt BP high normal today. No medications currently for HTN. --Continue growth [place], scheduled due to maternal obesity --Discussed IOL between 38-40 weeks, sooner if BP becomes higher or symptoms develop --PEC s/sx reviewed --Pt unable to have a waterbirth but can use hydrotherapy with portable fetal monitors, can be supported in freedom of movement and natural labor as desired.  Pt states understanding and agrees with changes to plan.  Preterm labor symptoms and general obstetric precautions including but not limited to vaginal bleeding, contractions, leaking of fluid and fetal movement were reviewed in detail with the patient. Please refer to After Visit Summary for other counseling recommendations.   Return in about 1 week (around 05/09/2021).  Future Appointments  Date Time Provider [place]  05/05/2021  7:15 AM [facility] NURSE [facility]  05/05/2021  7:30 AM [facility] US2 [facility]  05/09/2021  1:30 PM Leftwich-Kirby, Kathie Dike, CNM [facility] None  05/13/2021  7:30 AM [facility] NURSE [facility]  05/13/2021  7:45 AM [facility] US7 [facility]  05/20/2021  8:30 AM [facility] NURSE [facility]  05/20/2021  8:45 AM [facility] US6 [facility]    Fatima Blank, CNM

## 2021-05-03 ENCOUNTER — Ambulatory Visit: Payer: Medicaid Other

## 2021-05-03 ENCOUNTER — Other Ambulatory Visit: Payer: Medicaid Other

## 2021-05-03 LAB — COMPREHENSIVE METABOLIC PANEL
ALT: 10 IU/L (ref 0–32)
AST: 16 IU/L (ref 0–40)
Albumin/Globulin Ratio: 1.3 (ref 1.2–2.2)
Albumin: 3.7 g/dL — ABNORMAL LOW (ref 3.9–5.0)
Alkaline Phosphatase: 135 IU/L — ABNORMAL HIGH (ref 44–121)
BUN/Creatinine Ratio: 13 (ref 9–23)
BUN: 8 mg/dL (ref 6–20)
Bilirubin Total: 0.2 mg/dL (ref 0.0–1.2)
CO2: 22 mmol/L (ref 20–29)
Calcium: 9 mg/dL (ref 8.7–10.2)
Chloride: 106 mmol/L (ref 96–106)
Creatinine, Ser: 0.6 mg/dL (ref 0.57–1.00)
Globulin, Total: 2.8 g/dL (ref 1.5–4.5)
Glucose: 82 mg/dL (ref 70–99)
Potassium: 4.8 mmol/L (ref 3.5–5.2)
Sodium: 142 mmol/L (ref 134–144)
Total Protein: 6.5 g/dL (ref 6.0–8.5)
eGFR: 125 mL/min/{1.73_m2} (ref >59)

## 2021-05-03 LAB — CERVICOVAGINAL ANCILLARY ONLY
Chlamydia: NEGATIVE
Comment: NEGATIVE
Comment: NORMAL
Neisseria Gonorrhea: NEGATIVE

## 2021-05-03 LAB — CBC
Hematocrit: 38.9 % (ref 34.0–46.6)
Hemoglobin: 12.7 g/dL (ref 11.1–15.9)
MCH: 30 pg (ref 26.6–33.0)
MCHC: 32.6 g/dL (ref 31.5–35.7)
MCV: 92 fL (ref 79–97)
Platelets: 218 10*3/uL (ref 150–450)
RBC: 4.24 x10E6/uL (ref 3.77–5.28)
RDW: 12.4 % (ref 11.7–15.4)
WBC: 8.8 10*3/uL (ref 3.4–10.8)

## 2021-05-05 ENCOUNTER — Encounter: Payer: Self-pay | Admitting: *Deleted

## 2021-05-05 ENCOUNTER — Ambulatory Visit: Payer: Medicaid Other | Admitting: *Deleted

## 2021-05-05 ENCOUNTER — Other Ambulatory Visit: Payer: Medicaid Other

## 2021-05-05 ENCOUNTER — Ambulatory Visit: Payer: Medicaid Other | Attending: Obstetrics

## 2021-05-05 ENCOUNTER — Other Ambulatory Visit: Payer: Self-pay

## 2021-05-05 VITALS — BP 129/76 | HR 87

## 2021-05-05 DIAGNOSIS — O3663X0 Maternal care for excessive fetal growth, third trimester, not applicable or unspecified: Secondary | ICD-10-CM

## 2021-05-05 DIAGNOSIS — O99213 Obesity complicating pregnancy, third trimester: Secondary | ICD-10-CM

## 2021-05-05 DIAGNOSIS — Z3A36 36 weeks gestation of pregnancy: Secondary | ICD-10-CM

## 2021-05-05 DIAGNOSIS — O99210 Obesity complicating pregnancy, unspecified trimester: Secondary | ICD-10-CM

## 2021-05-05 DIAGNOSIS — E669 Obesity, unspecified: Secondary | ICD-10-CM | POA: Diagnosis not present

## 2021-05-05 DIAGNOSIS — O133 Gestational [pregnancy-induced] hypertension without significant proteinuria, third trimester: Secondary | ICD-10-CM

## 2021-05-05 DIAGNOSIS — Z3480 Encounter for supervision of other normal pregnancy, unspecified trimester: Secondary | ICD-10-CM

## 2021-05-06 LAB — CULTURE, BETA STREP (GROUP B ONLY): Strep Gp B Culture: NEGATIVE

## 2021-05-09 ENCOUNTER — Encounter: Payer: Self-pay | Admitting: Advanced Practice Midwife

## 2021-05-09 ENCOUNTER — Other Ambulatory Visit: Payer: Self-pay

## 2021-05-09 ENCOUNTER — Ambulatory Visit (INDEPENDENT_AMBULATORY_CARE_PROVIDER_SITE_OTHER): Payer: Medicaid Other | Admitting: Advanced Practice Midwife

## 2021-05-09 VITALS — BP 136/89 | HR 90 | Wt 334.0 lb

## 2021-05-09 DIAGNOSIS — O10913 Unspecified pre-existing hypertension complicating pregnancy, third trimester: Secondary | ICD-10-CM

## 2021-05-09 DIAGNOSIS — R102 Pelvic and perineal pain: Secondary | ICD-10-CM

## 2021-05-09 DIAGNOSIS — Z3A36 36 weeks gestation of pregnancy: Secondary | ICD-10-CM

## 2021-05-09 DIAGNOSIS — O26893 Other specified pregnancy related conditions, third trimester: Secondary | ICD-10-CM

## 2021-05-09 DIAGNOSIS — O0990 Supervision of high risk pregnancy, unspecified, unspecified trimester: Secondary | ICD-10-CM

## 2021-05-09 NOTE — Progress Notes (Signed)
Pt presents for ROB. PCR urine completed today  PHQ9= 0  GAD7= 0 Growth u/s and BPP scheduled 05/20/21

## 2021-05-09 NOTE — Progress Notes (Signed)
° °  PRENATAL VISIT NOTE  Subjective:  Joan Dawson is a 31 y.o. G2P1001 at [redacted]w[redacted]d being seen today for ongoing prenatal care.  She is currently monitored for the following issues for this high-risk pregnancy and has History of migraine; Right knee pain; Supervision of other normal pregnancy, antepartum; Obesity in pregnancy; Elevated blood pressure reading in office without diagnosis of hypertension; LGA (large for gestational age) fetus affecting management of mother; and Gestational hypertension w/o significant proteinuria in 3rd trimester on their problem list.  Patient reports  pelvic pain with activity .  Contractions: Not present. Vag. Bleeding: None.  Movement: Present. Denies leaking of fluid.   The following portions of the patient's history were reviewed and updated as appropriate: allergies, current medications, past family history, past medical history, past social history, past surgical history and problem list.   Objective:   Vitals:   05/09/21 1355  BP: 136/89  Pulse: 90  Weight: (!) 334 lb (151.5 kg)    Fetal Status: Fetal Heart Rate (bpm): 138   Movement: Present     General:  Alert, oriented and cooperative. Patient is in no acute distress.  Skin: Skin is warm and dry. No rash noted.   Cardiovascular: Normal heart rate noted  Respiratory: Normal respiratory effort, no problems with respiration noted  Abdomen: Soft, gravid, appropriate for gestational age.  Pain/Pressure: Absent     Pelvic: Cervical exam deferred        Extremities: Normal range of motion.  Edema: Trace  Mental Status: Normal mood and affect. Normal behavior. Normal judgment and thought content.   Assessment and Plan:  Pregnancy: G2P1001 at [redacted]w[redacted]d 1. Chronic hypertension in obstetric context in third trimester --Complete baseline labs today, CBC, CMP done at last visit - Protein / creatinine ratio, urine  2. Supervision of high risk pregnancy, antepartum --Anticipatory guidance about next  visits/weeks of pregnancy given. --Next appt in 1 week --Discuss IOL at next appointment, pt desires at or after 39 weeks for [facility] if labs, BP, antenatal testing remain stable  3. [redacted] weeks gestation of pregnancy   4. Pelvic pain affecting pregnancy in third trimester, antepartum --Rest/ice/heat/warm bath/increase PO fluids/Tylenol/pregnancy support belt  Term labor symptoms and general obstetric precautions including but not limited to vaginal bleeding, contractions, leaking of fluid and fetal movement were reviewed in detail with the patient. Please refer to After Visit Summary for other counseling recommendations.   Return in about 1 week (around 05/16/2021).  Future Appointments  Date Time Provider [place]  05/13/2021  7:30 AM [place] NURSE [facility]  05/13/2021  7:45 AM [facility] US7 [facility]  05/17/2021  8:55 AM Shelly Bombard, MD [facility] None  05/20/2021  8:30 AM [facility] NURSE [facility]  05/20/2021  8:45 AM [facility] US6 [facility]    Fatima Blank, CNM

## 2021-05-10 ENCOUNTER — Encounter: Payer: Self-pay | Admitting: Advanced Practice Midwife

## 2021-05-10 LAB — PROTEIN / CREATININE RATIO, URINE
Creatinine, Urine: 100.1 mg/dL
Protein, Ur: 14.6 mg/dL
Protein/Creat Ratio: 146 mg/g creat (ref 0–200)

## 2021-05-13 ENCOUNTER — Ambulatory Visit: Payer: Medicaid Other | Admitting: *Deleted

## 2021-05-13 ENCOUNTER — Other Ambulatory Visit: Payer: Self-pay

## 2021-05-13 ENCOUNTER — Ambulatory Visit: Payer: Medicaid Other | Attending: Obstetrics

## 2021-05-13 ENCOUNTER — Encounter: Payer: Self-pay | Admitting: *Deleted

## 2021-05-13 VITALS — BP 139/79 | HR 83

## 2021-05-13 DIAGNOSIS — E668 Other obesity: Secondary | ICD-10-CM

## 2021-05-13 DIAGNOSIS — O99213 Obesity complicating pregnancy, third trimester: Secondary | ICD-10-CM | POA: Insufficient documentation

## 2021-05-13 DIAGNOSIS — Z3A37 37 weeks gestation of pregnancy: Secondary | ICD-10-CM | POA: Insufficient documentation

## 2021-05-13 DIAGNOSIS — O99210 Obesity complicating pregnancy, unspecified trimester: Secondary | ICD-10-CM

## 2021-05-13 DIAGNOSIS — O10013 Pre-existing essential hypertension complicating pregnancy, third trimester: Secondary | ICD-10-CM | POA: Diagnosis not present

## 2021-05-13 DIAGNOSIS — Z362 Encounter for other antenatal screening follow-up: Secondary | ICD-10-CM | POA: Diagnosis not present

## 2021-05-13 DIAGNOSIS — O3663X0 Maternal care for excessive fetal growth, third trimester, not applicable or unspecified: Secondary | ICD-10-CM

## 2021-05-13 DIAGNOSIS — Z3480 Encounter for supervision of other normal pregnancy, unspecified trimester: Secondary | ICD-10-CM

## 2021-05-16 ENCOUNTER — Ambulatory Visit (INDEPENDENT_AMBULATORY_CARE_PROVIDER_SITE_OTHER): Payer: Medicaid Other | Admitting: Advanced Practice Midwife

## 2021-05-16 ENCOUNTER — Other Ambulatory Visit: Payer: Self-pay

## 2021-05-16 VITALS — BP 140/86 | HR 79 | Wt 338.0 lb

## 2021-05-16 DIAGNOSIS — Z3A37 37 weeks gestation of pregnancy: Secondary | ICD-10-CM

## 2021-05-16 DIAGNOSIS — O10913 Unspecified pre-existing hypertension complicating pregnancy, third trimester: Secondary | ICD-10-CM

## 2021-05-16 DIAGNOSIS — O0990 Supervision of high risk pregnancy, unspecified, unspecified trimester: Secondary | ICD-10-CM

## 2021-05-16 NOTE — Patient Instructions (Signed)
Things to Try After 37 weeks to Encourage Labor/Get Ready for Labor:    Try the Miles Circuit at www.milescircuit.com daily to improve baby's position and encourage the onset of labor.  Walk a little and rest a little every day.  Change positions often.  Cervical Ripening: May try one or both Red Raspberry Leaf capsules or tea:  two 300mg or 400mg tablets with each meal, 2-3 times a day, or 1-3 cups of tea daily  Potential Side Effects Of Raspberry Leaf:  Most women do not experience any side effects from drinking raspberry leaf tea. However, nausea and loose stools are possible   Evening Primrose Oil capsules: take 1 capsule by mouth and place one capsule in the vagina every night.    Some of the potential side effects:  Upset stomach  Loose stools or diarrhea  Headaches  Nausea  Sex can also help the cervix ripen and encourage labor onset.    Labor Precautions Reasons to come to [facility] at [facility]:  1.  Contractions are  5 minutes apart or less, each last 1 minute, these have been going on for 1-2 hours, and you cannot walk or talk during them 2.  You have a large gush of fluid, or a trickle of fluid that will not stop and you have to wear a pad 3.  You have bleeding that is bright red, heavier than spotting--like menstrual bleeding (spotting can be normal in early labor or after a check of your cervix) 4.  You do not feel the baby moving like he/she normally does  

## 2021-05-16 NOTE — Progress Notes (Signed)
° °  PRENATAL VISIT NOTE  Subjective:  Joan Dawson is a 31 y.o. G2P1001 at [redacted]w[redacted]d being seen today for ongoing prenatal care.  She is currently monitored for the following issues for this high-risk pregnancy and has History of migraine; Right knee pain; Supervision of other normal pregnancy, antepartum; Obesity in pregnancy; Chronic hypertension during pregnancy, antepartum; and LGA (large for gestational age) fetus affecting management of mother on their problem list.  Patient reports occasional contractions.  Contractions: Not present. Vag. Bleeding: None.  Movement: Present. Denies leaking of fluid.   The following portions of the patient's history were reviewed and updated as appropriate: allergies, current medications, past family history, past medical history, past social history, past surgical history and problem list.   Objective:   Vitals:   05/16/21 1325  BP: 140/86  Pulse: 79  Weight: (!) 338 lb (153.3 kg)    Fetal Status: Fetal Heart Rate (bpm): 145   Movement: Present     General:  Alert, oriented and cooperative. Patient is in no acute distress.  Skin: Skin is warm and dry. No rash noted.   Cardiovascular: Normal heart rate noted  Respiratory: Normal respiratory effort, no problems with respiration noted  Abdomen: Soft, gravid, appropriate for gestational age.  Pain/Pressure: Present     Pelvic: Cervical exam deferred        Extremities: Normal range of motion.  Edema: None  Mental Status: Normal mood and affect. Normal behavior. Normal judgment and thought content.   Assessment and Plan:  Pregnancy: G2P1001 at [redacted]w[redacted]d 1. Chronic hypertension in obstetric context in third trimester --BP 140s/80s today, stable --No s/sx of PEC  2. Supervision of high risk pregnancy, antepartum --Anticipatory guidance about next visits/weeks of pregnancy given. --Next appt in 1 week, discuss IOL next week, considering 39 weeks  3. [redacted] weeks gestation of pregnancy   Term labor  symptoms and general obstetric precautions including but not limited to vaginal bleeding, contractions, leaking of fluid and fetal movement were reviewed in detail with the patient. Please refer to After Visit Summary for other counseling recommendations.   No follow-ups on file.  Future Appointments  Date Time Provider [place]  05/20/2021  8:30 AM [facility] NURSE [facility]  05/20/2021  8:45 AM [facility] US6 [facility]  05/23/2021 11:15 AM Leftwich-Kirby, Kathie Dike, CNM [facility] None  05/30/2021 11:15 AM Leftwich-Kirby, Kathie Dike, CNM [facility] None    Fatima Blank, CNM

## 2021-05-17 ENCOUNTER — Encounter: Payer: Medicaid Other | Admitting: Obstetrics

## 2021-05-19 ENCOUNTER — Other Ambulatory Visit: Payer: Self-pay

## 2021-05-19 ENCOUNTER — Ambulatory Visit (HOSPITAL_BASED_OUTPATIENT_CLINIC_OR_DEPARTMENT_OTHER): Payer: Medicaid Other

## 2021-05-19 ENCOUNTER — Ambulatory Visit: Payer: Medicaid Other | Attending: Obstetrics | Admitting: *Deleted

## 2021-05-19 ENCOUNTER — Ambulatory Visit: Payer: Medicaid Other | Attending: Obstetrics | Admitting: Obstetrics

## 2021-05-19 VITALS — BP 137/77 | HR 90

## 2021-05-19 DIAGNOSIS — Z3A38 38 weeks gestation of pregnancy: Secondary | ICD-10-CM | POA: Insufficient documentation

## 2021-05-19 DIAGNOSIS — O10913 Unspecified pre-existing hypertension complicating pregnancy, third trimester: Secondary | ICD-10-CM | POA: Diagnosis not present

## 2021-05-19 DIAGNOSIS — E669 Obesity, unspecified: Secondary | ICD-10-CM | POA: Diagnosis not present

## 2021-05-19 DIAGNOSIS — E668 Other obesity: Secondary | ICD-10-CM | POA: Diagnosis not present

## 2021-05-19 DIAGNOSIS — O99210 Obesity complicating pregnancy, unspecified trimester: Secondary | ICD-10-CM

## 2021-05-19 DIAGNOSIS — O10013 Pre-existing essential hypertension complicating pregnancy, third trimester: Secondary | ICD-10-CM | POA: Diagnosis not present

## 2021-05-19 DIAGNOSIS — O99213 Obesity complicating pregnancy, third trimester: Secondary | ICD-10-CM

## 2021-05-19 DIAGNOSIS — O4103X0 Oligohydramnios, third trimester, not applicable or unspecified: Secondary | ICD-10-CM

## 2021-05-19 DIAGNOSIS — O3663X0 Maternal care for excessive fetal growth, third trimester, not applicable or unspecified: Secondary | ICD-10-CM

## 2021-05-19 DIAGNOSIS — Z3480 Encounter for supervision of other normal pregnancy, unspecified trimester: Secondary | ICD-10-CM

## 2021-05-19 NOTE — Progress Notes (Signed)
MFM Note  Joan Dawson was seen for a follow up growth scan and BPP scan due to maternal obesity and chronic hypertension that is not treated with any medications.  She denies any problems since her last exam and denies any leakage of fluid.  She was informed that the fetal growth measures large for her gestational age (EFW 8 pounds 9 ounces, 92nd percentile).  Oligohydramnios with a total AFI of 5.2 cm is noted.    A BPP performed today was 8 out of 8.  Due to oligohydramnios, delivery is recommended at her current gestational age.  The patient reports that she has a doctor's appointment for her son tomorrow morning and can come in tomorrow night for induction.  I called labor and delivery and have arranged an induction for her tomorrow night.  The patient is agreeable for induction tomorrow.  She stated that all of her questions have been answered.  A total of 20 minutes was spent counseling and coordinating the care for this patient.  Greater than 50% of the time was spent in direct face-to-face contact.

## 2021-05-20 ENCOUNTER — Ambulatory Visit: Payer: Medicaid Other

## 2021-05-20 ENCOUNTER — Other Ambulatory Visit (HOSPITAL_COMMUNITY): Payer: Self-pay | Admitting: *Deleted

## 2021-05-20 ENCOUNTER — Encounter (HOSPITAL_COMMUNITY): Payer: Self-pay | Admitting: *Deleted

## 2021-05-20 ENCOUNTER — Other Ambulatory Visit: Payer: Self-pay | Admitting: Advanced Practice Midwife

## 2021-05-20 ENCOUNTER — Other Ambulatory Visit: Payer: Self-pay | Admitting: Family Medicine

## 2021-05-20 DIAGNOSIS — O99210 Obesity complicating pregnancy, unspecified trimester: Secondary | ICD-10-CM

## 2021-05-20 DIAGNOSIS — O3663X0 Maternal care for excessive fetal growth, third trimester, not applicable or unspecified: Secondary | ICD-10-CM

## 2021-05-20 DIAGNOSIS — Z3480 Encounter for supervision of other normal pregnancy, unspecified trimester: Secondary | ICD-10-CM

## 2021-05-21 ENCOUNTER — Inpatient Hospital Stay (HOSPITAL_COMMUNITY)
Admission: AD | Admit: 2021-05-21 | Discharge: 2021-05-23 | DRG: 805 | Disposition: A | Discharge status: Home / Self Care | Payer: Medicaid Other | Attending: Obstetrics & Gynecology | Admitting: Obstetrics & Gynecology

## 2021-05-21 ENCOUNTER — Other Ambulatory Visit: Payer: Self-pay

## 2021-05-21 ENCOUNTER — Encounter (HOSPITAL_COMMUNITY): Payer: Self-pay | Admitting: Obstetrics and Gynecology

## 2021-05-21 ENCOUNTER — Inpatient Hospital Stay (HOSPITAL_COMMUNITY): Payer: Medicaid Other | Attending: Family Medicine

## 2021-05-21 DIAGNOSIS — O99210 Obesity complicating pregnancy, unspecified trimester: Secondary | ICD-10-CM

## 2021-05-21 DIAGNOSIS — U071 COVID-19: Secondary | ICD-10-CM | POA: Diagnosis present

## 2021-05-21 DIAGNOSIS — Z3A38 38 weeks gestation of pregnancy: Secondary | ICD-10-CM

## 2021-05-21 DIAGNOSIS — Z7982 Long term (current) use of aspirin: Secondary | ICD-10-CM

## 2021-05-21 DIAGNOSIS — O114 Pre-existing hypertension with pre-eclampsia, complicating childbirth: Secondary | ICD-10-CM | POA: Diagnosis present

## 2021-05-21 DIAGNOSIS — O1002 Pre-existing essential hypertension complicating childbirth: Secondary | ICD-10-CM

## 2021-05-21 DIAGNOSIS — Z87891 Personal history of nicotine dependence: Secondary | ICD-10-CM

## 2021-05-21 DIAGNOSIS — O0993 Supervision of high risk pregnancy, unspecified, third trimester: Secondary | ICD-10-CM | POA: Insufficient documentation

## 2021-05-21 DIAGNOSIS — O4103X0 Oligohydramnios, third trimester, not applicable or unspecified: Secondary | ICD-10-CM

## 2021-05-21 DIAGNOSIS — O3663X0 Maternal care for excessive fetal growth, third trimester, not applicable or unspecified: Secondary | ICD-10-CM | POA: Diagnosis present

## 2021-05-21 DIAGNOSIS — O99214 Obesity complicating childbirth: Secondary | ICD-10-CM | POA: Diagnosis present

## 2021-05-21 DIAGNOSIS — O4100X0 Oligohydramnios, unspecified trimester, not applicable or unspecified: Secondary | ICD-10-CM | POA: Diagnosis present

## 2021-05-21 DIAGNOSIS — O1414 Severe pre-eclampsia complicating childbirth: Secondary | ICD-10-CM

## 2021-05-21 DIAGNOSIS — O9852 Other viral diseases complicating childbirth: Secondary | ICD-10-CM

## 2021-05-21 DIAGNOSIS — D62 Acute posthemorrhagic anemia: Secondary | ICD-10-CM | POA: Diagnosis not present

## 2021-05-21 DIAGNOSIS — O9081 Anemia of the puerperium: Secondary | ICD-10-CM | POA: Diagnosis not present

## 2021-05-21 DIAGNOSIS — Z3480 Encounter for supervision of other normal pregnancy, unspecified trimester: Secondary | ICD-10-CM

## 2021-05-21 DIAGNOSIS — O119 Pre-existing hypertension with pre-eclampsia, unspecified trimester: Secondary | ICD-10-CM | POA: Diagnosis present

## 2021-05-21 DIAGNOSIS — O3660X0 Maternal care for excessive fetal growth, unspecified trimester, not applicable or unspecified: Secondary | ICD-10-CM | POA: Diagnosis present

## 2021-05-21 LAB — COMPREHENSIVE METABOLIC PANEL
ALT: 12 U/L (ref 0–44)
AST: 23 U/L (ref 15–41)
Albumin: 3 g/dL — ABNORMAL LOW (ref 3.5–5.0)
Alkaline Phosphatase: 156 U/L — ABNORMAL HIGH (ref 38–126)
Anion gap: 11 (ref 5–15)
BUN: 8 mg/dL (ref 6–20)
CO2: 19 mmol/L — ABNORMAL LOW (ref 22–32)
Calcium: 8.5 mg/dL — ABNORMAL LOW (ref 8.9–10.3)
Chloride: 105 mmol/L (ref 98–111)
Creatinine, Ser: 0.78 mg/dL (ref 0.44–1.00)
GFR, Estimated: >60 mL/min (ref >60)
Glucose, Bld: 84 mg/dL (ref 70–99)
Potassium: 4 mmol/L (ref 3.5–5.1)
Sodium: 135 mmol/L (ref 135–145)
Total Bilirubin: 0.8 mg/dL (ref 0.3–1.2)
Total Protein: 6.4 g/dL — ABNORMAL LOW (ref 6.5–8.1)

## 2021-05-21 LAB — CBC
HCT: 34.9 % — ABNORMAL LOW (ref 36.0–46.0)
HCT: 37.2 % (ref 36.0–46.0)
HCT: 30.3 % — ABNORMAL LOW (ref 36.0–46.0)
Hemoglobin: 11.9 g/dL — ABNORMAL LOW (ref 12.0–15.0)
Hemoglobin: 12.5 g/dL (ref 12.0–15.0)
Hemoglobin: 9.8 g/dL — ABNORMAL LOW (ref 12.0–15.0)
MCH: 30.3 pg (ref 26.0–34.0)
MCH: 29.6 pg (ref 26.0–34.0)
MCH: 29.3 pg (ref 26.0–34.0)
MCHC: 34.1 g/dL (ref 30.0–36.0)
MCHC: 33.6 g/dL (ref 30.0–36.0)
MCHC: 32.3 g/dL (ref 30.0–36.0)
MCV: 88.8 fL (ref 80.0–100.0)
MCV: 88.2 fL (ref 80.0–100.0)
MCV: 90.7 fL (ref 80.0–100.0)
Platelets: 193 10*3/uL (ref 150–400)
Platelets: 203 10*3/uL (ref 150–400)
Platelets: 202 10*3/uL (ref 150–400)
RBC: 3.93 MIL/uL (ref 3.87–5.11)
RBC: 4.22 MIL/uL (ref 3.87–5.11)
RBC: 3.34 MIL/uL — ABNORMAL LOW (ref 3.87–5.11)
RDW: 13.2 % (ref 11.5–15.5)
RDW: 13.3 % (ref 11.5–15.5)
RDW: 13.4 % (ref 11.5–15.5)
WBC: 5.8 10*3/uL (ref 4.0–10.5)
WBC: 7.7 10*3/uL (ref 4.0–10.5)
WBC: 9 10*3/uL (ref 4.0–10.5)
nRBC: 0 % (ref 0.0–0.2)
nRBC: 0 % (ref 0.0–0.2)
nRBC: 0 % (ref 0.0–0.2)

## 2021-05-21 LAB — RESP PANEL BY RT-PCR (FLU A&B, COVID) ARPGX2
Influenza A by PCR: NEGATIVE
Influenza B by PCR: NEGATIVE
SARS Coronavirus 2 by RT PCR: POSITIVE — AB

## 2021-05-21 LAB — DIC (DISSEMINATED INTRAVASCULAR COAGULATION)PANEL
D-Dimer, Quant: 0.85 ug/mL-FEU — ABNORMAL HIGH (ref 0.00–0.50)
Fibrinogen: 436 mg/dL (ref 210–475)
INR: 1 (ref 0.8–1.2)
Platelets: 195 10*3/uL (ref 150–400)
Prothrombin Time: 12.7 seconds (ref 11.4–15.2)
Smear Review: NONE SEEN
aPTT: 22 seconds — ABNORMAL LOW (ref 24–36)

## 2021-05-21 LAB — TYPE AND SCREEN
ABO/RH(D): B POS
Antibody Screen: NEGATIVE

## 2021-05-21 LAB — POSTPARTUM HEMORRHAGE PROTOCOL (BB NOTIFICATION)

## 2021-05-21 LAB — RPR: RPR Ser Ql: NONREACTIVE

## 2021-05-21 MED ORDER — PROMETHAZINE HCL 12.5 MG PO TABS
12.5000 mg | ORAL_TABLET | Freq: Once | ORAL | Status: AC
  Administered 2021-05-21: 12.5 mg via ORAL
  Filled 2021-05-21: qty 1

## 2021-05-21 MED ORDER — FENTANYL-BUPIVACAINE-NACL 0.5-0.125-0.9 MG/250ML-% EP SOLN: 12.0000 mL/h | EPIDURAL | Status: DC | PRN

## 2021-05-21 MED ORDER — LABETALOL HCL 5 MG/ML IV SOLN
20.0000 mg | INTRAVENOUS | Status: DC | PRN
  Administered 2021-05-21: 20 mg via INTRAVENOUS
  Filled 2021-05-21: qty 4

## 2021-05-21 MED ORDER — DIPHENHYDRAMINE HCL 50 MG/ML IJ SOLN: 12.5000 mg | INTRAMUSCULAR | Status: DC | PRN

## 2021-05-21 MED ORDER — LACTATED RINGERS IV SOLN: INTRAVENOUS | Status: DC

## 2021-05-21 MED ORDER — TRANEXAMIC ACID-NACL 1000-0.7 MG/100ML-% IV SOLN
INTRAVENOUS | Status: AC
  Administered 2021-05-21: 1000 mg via INTRAVENOUS
  Filled 2021-05-21: qty 100

## 2021-05-21 MED ORDER — OXYCODONE-ACETAMINOPHEN 5-325 MG PO TABS
2.0000 | ORAL_TABLET | ORAL | Status: DC | PRN
  Filled 2021-05-21: qty 2

## 2021-05-21 MED ORDER — OXYCODONE-ACETAMINOPHEN 5-325 MG PO TABS: 1.0000 | ORAL_TABLET | ORAL | Status: DC | PRN

## 2021-05-21 MED ORDER — ONDANSETRON HCL 4 MG/2ML IJ SOLN
4.0000 mg | Freq: Four times a day (QID) | INTRAMUSCULAR | Status: DC | PRN
  Administered 2021-05-21: 4 mg via INTRAVENOUS
  Filled 2021-05-21: qty 2

## 2021-05-21 MED ORDER — LACTATED RINGERS IV BOLUS: 1000.0000 mL | Freq: Once | INTRAVENOUS | Status: DC

## 2021-05-21 MED ORDER — LACTATED RINGERS IV SOLN: 500.0000 mL | Freq: Once | INTRAVENOUS | Status: DC

## 2021-05-21 MED ORDER — OXYTOCIN-SODIUM CHLORIDE 30-0.9 UT/500ML-% IV SOLN
1.0000 m[IU]/min | INTRAVENOUS | Status: DC
  Administered 2021-05-21: 2 m[IU]/min via INTRAVENOUS
  Filled 2021-05-21: qty 500

## 2021-05-21 MED ORDER — CARBOPROST TROMETHAMINE 250 MCG/ML IM SOLN: 250.0000 ug | Freq: Once | INTRAMUSCULAR | Status: AC

## 2021-05-21 MED ORDER — TRANEXAMIC ACID-NACL 1000-0.7 MG/100ML-% IV SOLN: 1000.0000 mg | INTRAVENOUS | Status: AC

## 2021-05-21 MED ORDER — EPHEDRINE 5 MG/ML INJ: 10.0000 mg | INTRAVENOUS | Status: DC | PRN

## 2021-05-21 MED ORDER — OXYTOCIN BOLUS FROM INFUSION
333.0000 mL | Freq: Once | INTRAVENOUS | Status: AC
  Administered 2021-05-21: 333 mL via INTRAVENOUS

## 2021-05-21 MED ORDER — TERBUTALINE SULFATE 1 MG/ML IJ SOLN: 0.2500 mg | Freq: Once | INTRAMUSCULAR | Status: DC | PRN

## 2021-05-21 MED ORDER — CARBOPROST TROMETHAMINE 250 MCG/ML IM SOLN
INTRAMUSCULAR | Status: AC
  Administered 2021-05-21: 250 ug via INTRAMUSCULAR
  Filled 2021-05-21: qty 1

## 2021-05-21 MED ORDER — MISOPROSTOL 50MCG HALF TABLET
50.0000 ug | ORAL_TABLET | ORAL | Status: DC | PRN
  Administered 2021-05-21 (×2): 50 ug via ORAL
  Filled 2021-05-21 (×2): qty 1

## 2021-05-21 MED ORDER — SOD CITRATE-CITRIC ACID 500-334 MG/5ML PO SOLN: 30.0000 mL | ORAL | Status: DC | PRN

## 2021-05-21 MED ORDER — LABETALOL HCL 5 MG/ML IV SOLN
40.0000 mg | INTRAVENOUS | Status: DC | PRN
  Administered 2021-05-21: 40 mg via INTRAVENOUS
  Filled 2021-05-21: qty 8

## 2021-05-21 MED ORDER — MAGNESIUM SULFATE BOLUS VIA INFUSION
4.0000 g | Freq: Once | INTRAVENOUS | Status: AC
  Administered 2021-05-21: 4 g via INTRAVENOUS
  Filled 2021-05-21: qty 1000

## 2021-05-21 MED ORDER — LACTATED RINGERS IV SOLN: 500.0000 mL | INTRAVENOUS | Status: DC | PRN

## 2021-05-21 MED ORDER — LIDOCAINE HCL (PF) 1 % IJ SOLN
30.0000 mL | INTRAMUSCULAR | Status: AC | PRN
  Administered 2021-05-21: 30 mL via SUBCUTANEOUS
  Filled 2021-05-21: qty 30

## 2021-05-21 MED ORDER — HYDRALAZINE HCL 20 MG/ML IJ SOLN: 10.0000 mg | INTRAMUSCULAR | Status: DC | PRN

## 2021-05-21 MED ORDER — OXYCODONE-ACETAMINOPHEN 5-325 MG PO TABS
2.0000 | ORAL_TABLET | Freq: Once | ORAL | Status: AC
  Administered 2021-05-21: 2 via ORAL

## 2021-05-21 MED ORDER — DIPHENOXYLATE-ATROPINE 2.5-0.025 MG PO TABS
2.0000 | ORAL_TABLET | Freq: Once | ORAL | Status: AC
  Administered 2021-05-21: 2 via ORAL
  Filled 2021-05-21: qty 2

## 2021-05-21 MED ORDER — PHENYLEPHRINE 40 MCG/ML (10ML) SYRINGE FOR IV PUSH (FOR BLOOD PRESSURE SUPPORT): 80.0000 ug | PREFILLED_SYRINGE | INTRAVENOUS | Status: DC | PRN

## 2021-05-21 MED ORDER — FENTANYL CITRATE (PF) 100 MCG/2ML IJ SOLN
100.0000 ug | INTRAMUSCULAR | Status: DC | PRN
  Administered 2021-05-21 (×5): 100 ug via INTRAVENOUS
  Filled 2021-05-21 (×5): qty 2

## 2021-05-21 MED ORDER — ACETAMINOPHEN 325 MG PO TABS: 650.0000 mg | ORAL_TABLET | ORAL | Status: DC | PRN

## 2021-05-21 MED ORDER — OXYTOCIN-SODIUM CHLORIDE 30-0.9 UT/500ML-% IV SOLN
2.5000 [IU]/h | INTRAVENOUS | Status: DC
  Administered 2021-05-21: 2.5 [IU]/h via INTRAVENOUS
  Filled 2021-05-21: qty 500

## 2021-05-21 MED ORDER — ZOLPIDEM TARTRATE 5 MG PO TABS
5.0000 mg | ORAL_TABLET | Freq: Every evening | ORAL | Status: DC | PRN
  Administered 2021-05-21: 5 mg via ORAL
  Filled 2021-05-21: qty 1

## 2021-05-21 MED ORDER — LABETALOL HCL 5 MG/ML IV SOLN: 80.0000 mg | INTRAVENOUS | Status: DC | PRN

## 2021-05-21 MED ORDER — MAGNESIUM SULFATE 40 GM/1000ML IV SOLN
2.0000 g/h | INTRAVENOUS | Status: AC
  Administered 2021-05-21 – 2021-05-22 (×2): 2 g/h via INTRAVENOUS
  Filled 2021-05-21 (×2): qty 1000

## 2021-05-21 NOTE — H&P (Addendum)
OBSTETRIC ADMISSION HISTORY AND PHYSICAL  Joan Dawson is a 31 y.o. female G2P1001 with IUP at 78w3dpresenting for IOL due to chronic hypertension and oligohydramnios. She was scheduled for induction on Monday, but after office visit, was told to come tonight. She reports +FMs, No LOF, no VB, no blurry vision, headaches, or peripheral edema, and no RUQ pain. Endorsed sinus headache yesterday and feeling anxious about induction. She plans on breastfeeding. She requests POP for birth control. She received her prenatal care at [facility]   Dating: By LMP consistent with 9 week ultrasound --->  Estimated Date of Delivery: 06/01/21  Sono (05/19/21):    @[redacted]w[redacted]d , CWD, normal anatomy, cephalic presentation, anterior placental lie, 3882g, 92% EFW   Prenatal History/Complications:  Chronic hypertension not treated with medications Obesity (BMI 49.91) Fetal growth measures large for gestational age (EFW 8 pounds 9 ounces, 92nd percentile). Pt previous baby 9+lbs without complication. Oligohydramnios with total AFI of 5.2cm  Past Medical History: Past Medical History:  Diagnosis Date   ADHD    Hypertension    Migraines    Scoliosis     Past Surgical History: Past Surgical History:  Procedure Laterality Date   NO PAST SURGERIES      Obstetrical History: OB History     Gravida  2   Para  1   Term  1   Preterm      AB      Living  1      SAB      IAB      Ectopic      Multiple  0   Live Births  1           Social History Social History   Socioeconomic History   Marital status: Single    Spouse name: Not on file   Number of children: Not on file   Years of education: Not on file   Highest education level: Not on file  Occupational History   Not on file  Tobacco Use   Smoking status: Former    Types: Cigars    Quit date: 06/2020    Years since quitting: 0.9   Smokeless tobacco: Never   Tobacco comments:    BLACK N MILD  Vaping Use   Vaping Use:  Former   Quit date: 07/06/2020  Substance and Sexual Activity   Alcohol use: Not Currently    Comment: not since confirmed pregnancy   Drug use: No   Sexual activity: Not Currently    Partners: Male    Birth control/protection: None  Other Topics Concern   Not on file  Social History Narrative   Not on file   Social Determinants of Health   Financial Resource Strain: Not on file  Food Insecurity: Not on file  Transportation Needs: Not on file  Physical Activity: Not on file  Stress: Not on file  Social Connections: Not on file    Family History: Family History  Problem Relation Age of Onset   Diabetes Mother    Uterine cancer Mother    Hypertension Mother    Crohn's disease Sister    Hypertension Sister    Hypertension Brother     Allergies: No Known Allergies  Medications Prior to Admission  Medication Sig Dispense Refill Last Dose   aspirin EC 81 MG tablet Take 1 tablet (81 mg total) by mouth daily. Swallow whole. 30 tablet 6    Blood Pressure Monitoring (BLOOD PRESSURE KIT) DEVI 1 kit by Does  not apply route once a week. 1 each 0    Prenatal Vit-Fe Phos-FA-Omega (VITAFOL GUMMIES) 3.33-0.333-34.8 MG CHEW Chew 3 tablets by mouth daily. 90 tablet 12      Review of Systems   All systems reviewed and negative except as stated in HPI  Last menstrual period 08/25/2020. General appearance: alert, cooperative, and no distress Lungs: Normal WOB Heart: Well-perfused Abdomen: soft, non-tender Presentation: cephalic Fetal monitoringBaseline: 130 bpm, Variability: Good {> 6 bpm), Accelerations: Reactive, and Decelerations: Absent  TOCO: Every 3-5 minutes    Prenatal labs: ABO, Rh: B/Positive/-- (07/13 1049) Antibody: Negative (07/13 1049) Rubella: 3.67 (07/13 1049) RPR: Non Reactive (11/09 0901)  HBsAg: Negative (07/13 1049)  HIV: Non Reactive (11/09 0901)  GBS: Negative/-- (01/09 1118)  2 hr Glucola: Normal Genetic screening  NIPS: Low risk female, AFP:  negative, Horizon: negative Anatomy US: Normal, LGA  Prenatal Transfer Tool  Maternal Diabetes: No Genetic Screening: NIPS: Low risk female, AFP: negative, Horizon: negative Maternal Ultrasounds/Referrals: Normal, LGA Fetal Ultrasounds or other Referrals:  None Maternal Substance Abuse:  No Significant Maternal Medications:  None Significant Maternal Lab Results: Group B Strep negative  No results found for this or any previous visit (from the past 24 hour(s)).  Patient Active Problem List   Diagnosis Date Noted   LGA (large for gestational age) fetus affecting management of mother 03/02/2021   Chronic hypertension during pregnancy, antepartum 12/01/2020   Obesity in pregnancy 11/03/2020   Supervision of other normal pregnancy, antepartum 10/27/2020   Right knee pain 12/02/2019   History of migraine 02/18/2018    Assessment/Plan:  Joan Dawson is a 31 y.o. G2P1001 at 4w3dhere for IOL due to chronic hypertension and oligohydramnios.  #Labor: Unfavorable cervix. Ordered cytotec x 1. Will recheck in 4 hours. #Pain: PRN #FWB: Category 1 #ID:  GBS negative #MOF: Breastfeeding #MOC: POPs #Circ:  Desires  #Chronic hypertension - Not on meds currently - BPs have been normal - Will continue to monitor   JDanie Chandler Medical Student  05/21/2021, 12:08 AM   Midwife attestation: I have seen and examined this patient; I agree with above documentation in the medical student's note.   Joan JMartiniqueis a 31y.o. G2P1001 here for IOL for CHTN with oligohydramnios.  PE: BP (!) 121/58    Pulse 72    Temp 98.5 F (36.9 C) (Oral)    Resp 16    Ht 5' 10"  (1.778 m)    Wt (!) 152.5 kg    LMP 08/25/2020    BMI 48.23 kg/m  Gen: calm comfortable, NAD Resp: normal effort, no distress Abd: gravid  ROS, labs, PMH reviewed  Plan: Admit to LD Labor: Cytotec for cervical ripening Fetal monitoring: Category I ID: GBS neg  LFatima Blank CNM  05/21/2021, 2:38 AM

## 2021-05-21 NOTE — Progress Notes (Signed)
Joan Dawson is a 31 y.o. G2P1001 at [redacted]w[redacted]d by LMP admitted for induction of labor due to [facility] with oligohydramnios.  Subjective: Pt comfortable, feeling some mild cramping.  Family at bedside for support.   Objective: BP (!) 121/58    Pulse 72    Temp 98.5 F (36.9 C) (Oral)    Resp 16    Ht 5\' 10"  (1.778 m)    Wt (!) 152.5 kg    LMP 08/25/2020    BMI 48.23 kg/m  No intake/output data recorded. No intake/output data recorded.  FHT:  FHR: 130 bpm, variability: moderate,  accelerations:  Present,  decelerations:  Absent UC:   irregular, every 2-10 minutes, mild to palpation SVE:   Dilation: 1 Effacement (%): 50 Station: -3 Exam by:: Fatima Blank CNM Membranes swept at pt request. Pt tolerated well.   Labs: Lab Results  Component Value Date   WBC 5.8 05/21/2021   HGB 11.9 (L) 05/21/2021   HCT 34.9 (L) 05/21/2021   MCV 88.8 05/21/2021   PLT 193 05/21/2021    Assessment / Plan: Induction of labor due to [facility] with oligo  Labor: Progressing normally Preeclampsia:  labs stable Fetal Wellbeing:  Category I Pain Control:  Labor support without medications I/D:   GBS neg Anticipated MOD:  NSVD  Fatima Blank 05/21/2021, 2:28 AM

## 2021-05-21 NOTE — Progress Notes (Signed)
Patient ID: Joan Dawson, female   DOB: 15-Aug-1990, 31 y.o.   MRN: [id number] Doing well Nausea better  . Vitals:   05/21/21 1401 05/21/21 1431 05/21/21 1501 05/21/21 1531  BP: (!) 143/83 137/83 127/74 125/71  Pulse: 64 70 65 61  Resp: 17 16 17 17   Temp: 98.9 F (37.2 C)     TempSrc: Oral     Weight:      Height:       FHR reassurig UCS spaced out  Pitocin only at 17mu/min, pt states "because it made me nauseated"  Dilation: 4 Effacement (%): 70 Station: -2, -3 Presentation: Vertex Exam by:: [email or web address] CNM  Encouraged her to let Wynelle Bourgeois go up on Pitocin infusion, pt agrees

## 2021-05-21 NOTE — Discharge Summary (Addendum)
Postpartum Discharge Summary  Date of Service updated     Patient Name: Joan Dawson DOB: November 17, 1990 MRN: [id number]  Date of admission: 05/21/2021 Delivery date:05/21/2021  Delivering provider: Darlina Rumpf  Date of discharge: 05/23/2021  Admitting diagnosis: Encounter for supervision of high risk pregnancy in third trimester, antepartum [O09.93] Intrauterine pregnancy: [redacted]w[redacted]d    Secondary diagnosis:  Active Problems:   Chronic hypertension with superimposed preeclampsia   LGA (large for gestational age) fetus affecting management of mother   Oligohydramnios   Postpartum hemorrhage COVID +  Additional problems: Postpartum hemporrhage    Discharge diagnosis: Term Pregnancy Delivered and CHTN with Superimposed Preeclampsia with Severe Features                                                Post partum procedures: none Augmentation: AROM, Pitocin, Cytotec, and IP Foley Complications: [id number] Hospital course: Induction of Labor With Vaginal Delivery   31y.o. yo G2P1001 at 352w3das admitted to the hospital 05/21/2021 for induction of labor.  Indication for induction:  CHTN .  Of note, pt was COVID positive, asymptomatic.  Patient had an uncomplicated labor course as follows: Membrane Rupture Time/Date: 6:44 PM ,05/21/2021   Delivery Method:Vaginal, Spontaneous  Episiotomy: None  Lacerations:  Periurethral  Details of delivery can be found in separate delivery note.  Due to severe preeclampsia, she was treated with IV Magnesium x 24hrs.  BPs were mostly 120/70s; however due to occasional BP of 130/70s and edema- started on Labetalol 10079mid  and Lasix 22m69m5 days with plans for close outpatient follow up.  She also received IV iron due to Hgb of 7.9.  She was meeting her postpartum milestones appropriately and patient is discharged home 05/23/21.  Newborn Data: Birth date:05/21/2021  Birth time:9:19 PM  Gender:Female  Living status:Living  Apgars:9 ,9   Weight:3720 g   Magnesium Sulfate received: Yes: Seizure prophylaxis BMZ received: No Rhophylac:N/A MMR:N/A T-DaP: Declined Flu: Declined by patient Transfusion:No  Physical exam  Vitals:   05/22/21 2004 05/22/21 2208 05/22/21 2313 05/23/21 0352  BP: 131/71 137/73 127/60 138/60  Pulse: 79 83 81 84  Resp: 16 18    Temp: (!) 97.4 F (36.3 C)     TempSrc: Oral     SpO2: 99% 97% 98%   Weight:      Height:       General: alert, cooperative, and no distress Lochia: appropriate CV: RRR Lungs: CTAB Uterine Fundus: firm Incision: N/A DVT Evaluation: No evidence of DVT seen on physical exam.  1+ edema noted bilaterally  Labs: Lab Results  Component Value Date   WBC 9.9 05/22/2021   HGB 7.9 (L) 05/22/2021   HCT 24.1 (L) 05/22/2021   MCV 90.6 05/22/2021   PLT 159 05/22/2021   CMP Latest Ref Rng & Units 05/21/2021  Glucose 70 - 99 mg/dL 84  BUN 6 - 20 mg/dL 8  Creatinine 0.44 - 1.00 mg/dL 0.78  Sodium 135 - 145 mmol/L 135  Potassium 3.5 - 5.1 mmol/L 4.0  Chloride 98 - 111 mmol/L 105  CO2 22 - 32 mmol/L 19(L)  Calcium 8.9 - 10.3 mg/dL 8.5(L)  Total Protein 6.5 - 8.1 g/dL 6.4(L)  Total Bilirubin 0.3 - 1.2 mg/dL 0.8  Alkaline Phos 38 - 126 U/L 156(H)  AST 15 - 41 U/L  23  ALT 0 - 44 U/L 12   Edinburgh Score: Edinburgh Postnatal Depression Scale Screening Tool 05/22/2021  I have been able to laugh and see the funny side of things. 0  I have looked forward with enjoyment to things. 0  I have blamed myself unnecessarily when things went wrong. 0  I have been anxious or worried for no good reason. 0  I have felt scared or panicky for no good reason. 0  Things have been getting on top of me. 1  I have been so unhappy that I have had difficulty sleeping. 0  I have felt sad or miserable. 0  I have been so unhappy that I have been crying. 0  The thought of harming myself has occurred to me. 0  Edinburgh Postnatal Depression Scale Total 1     After visit meds:  Allergies  as of 05/23/2021   No Known Allergies      Medication List     STOP taking these medications    aspirin EC 81 MG tablet       TAKE these medications    acetaminophen 325 MG tablet Commonly known as: Tylenol Take 2 tablets (650 mg total) by mouth every 4 (four) hours as needed (for pain scale < 4).   Blood Pressure Kit Devi 1 kit by Does not apply route once a week.   furosemide 20 MG tablet Commonly known as: LASIX Take 1 tablet (20 mg total) by mouth daily for 4 days.   ibuprofen 600 MG tablet Commonly known as: ADVIL Take 1 tablet (600 mg total) by mouth every 6 (six) hours as needed.   labetalol 100 MG tablet Commonly known as: NORMODYNE Take 1 tablet (100 mg total) by mouth 2 (two) times daily.   norethindrone 0.35 MG tablet Commonly known as: Ortho Micronor Take 1 tablet (0.35 mg total) by mouth daily.   Vitafol Gummies 3.33-0.333-34.8 MG Chew Chew 3 tablets by mouth daily.         Discharge home in stable condition Infant Feeding: Breast Infant Disposition:home with mother Discharge instruction: per After Visit Summary and Postpartum booklet. Activity: Advance as tolerated. Pelvic rest for 6 weeks.  Diet: low salt diet Future Appointments: Future Appointments  Date Time Provider [place]  05/23/2021 11:15 AM Leftwich-Kirby, Kathie Dike, CNM [facility] None  05/30/2021 11:15 AM Leftwich-Kirby, Kathie Dike, CNM [facility] None   Follow up Visit:  [place]. Schedule an appointment as soon as possible for a visit in 1 week(s).   Specialty: Obstetrics and Gynecology Why: Please follow up in one week for a blood pressure check Contact information: [address] [place] [phone]               Message sent by Maryelizabeth Kaufmann, CNM on 05/21/2021  Please schedule this patient for a In person postpartum visit in 4 weeks with the following provider:  MD. Additional Postpartum F/U:BP check 1 week  High risk pregnancy complicated by:  CHTN with [place] w/ SF Delivery mode:  Vaginal, Spontaneous  Anticipated Birth Control:  POPs   05/23/2021 Annalee Genta, DO

## 2021-05-21 NOTE — Progress Notes (Signed)
Joan Dawson is a 31 y.o. G2P1001 at [redacted]w[redacted]d  admitted for cHTN and oligo  Subjective: Reports feeling some contractions. Feeling a little frustrated that things haven't moved along  Objective: BP (!) 146/82    Pulse 76    Temp 98.9 F (37.2 C) (Oral)    Resp 16    Ht 5\' 10"  (1.778 m)    Wt (!) 152.5 kg    LMP 08/25/2020    BMI 48.23 kg/m  I/O last 3 completed shifts: In: 469.8 [P.O.:120; I.V.:349.8] Out: -  No intake/output data recorded.  FHT:  FHR: 130 bpm, variability: moderate,  accelerations:  Present,  decelerations:  Absent UC:   irregular, every 3-5 minutes SVE:   Dilation: 4.5 Effacement (%): 50-60 Station: -2 to -3 Exam by:: Cy Blamer, MD  Labs: Lab Results  Component Value Date   WBC 5.8 05/21/2021   HGB 11.9 (L) 05/21/2021   HCT 34.9 (L) 05/21/2021   MCV 88.8 05/21/2021   PLT 193 05/21/2021    Assessment / Plan: G2P1001 at [redacted]w[redacted]d  admitted for cHTN and oligo  Labor:  cervix unchanged from before at 4.5 cm. On Pitocin of 12. Head well applied. AROM done during current check. Felt tug on bag from [facility]. Very minor amount of fluid expressed gHTN:   BP in mild range 140s/80s. No symptoms. Continue to monitor Fetal Wellbeing:  Category I Pain Control:  IV pain meds I/D:   GBS neg   Renard Matter 05/21/2021, 6:55 PM  -

## 2021-05-21 NOTE — Discharge Instructions (Signed)

## 2021-05-21 NOTE — Progress Notes (Signed)
Joan Dawson is a 31 y.o. G2P1001 at [redacted]w[redacted]d   Subjective: Moaning and crying, intense pain with contractions since AROM. Patient's partner and her mom at bedside and engaged in care. She denies headache, visual disturbances, RUQ/epigastric pain.   Objective: BP (!) 178/84    Pulse (!) 103    Temp 98.1 F (36.7 C) (Oral)    Resp 18    Ht 5\' 10"  (1.778 m)    Wt (!) 152.5 kg    LMP 08/25/2020    BMI 48.23 kg/m  I/O last 3 completed shifts: In: 469.8 [P.O.:120; I.V.:349.8] Out: -  No intake/output data recorded.  FHT:  FHR: 120 bpm, variability: moderate,  accelerations:  Present,  decelerations:  Absent UC:   irregular, every 2-4 minutes SVE:   Dilation: 7 Effacement (%): 100 Station: 0, -1 Exam by:: [employer] CNM  Labs: Lab Results  Component Value Date   WBC 5.8 05/21/2021   HGB 11.9 (L) 05/21/2021   HCT 34.9 (L) 05/21/2021   MCV 88.8 05/21/2021   PLT 193 05/21/2021   Patient Vitals for the past 24 hrs:  BP Temp Temp src Pulse Resp Height Weight  05/21/21 2024 (!) 178/84 -- -- (!) 103 -- -- --  05/21/21 2005 (!) 160/95 -- -- -- 18 -- --  05/21/21 1930 (!) 157/88 -- -- 72 -- -- --    Assessment / Plan: --30 y.o. G2P1001 at [redacted]w[redacted]d  --GBS Neg --Cat I tracing --IOL CHTN, new onset severe range BP x 2, now SIPC w/ SF --IV Push Labetalol and Magnesium Sulfate infusion per protocol --PEC labs reordered --Patient requesting epidural now, order placed --7/100/0, vertex by suture per my exam --Reassess in 4 hours or earlier as indicated by change in labor status --Anticipate vaginal delivery, update provided to Dr. Molly Maduro, CNM 05/21/2021, 8:47 PM

## 2021-05-21 NOTE — Progress Notes (Signed)
Halea Swaziland is a 31 y.o. G2P1001 at [redacted]w[redacted]d by LMP admitted for induction of labor due to [facility] and oligo.  Subjective: Pt feeling mild cramping.  Pt family in room for support.   Objective: BP 135/65    Pulse 74    Temp 98.3 F (36.8 C) (Oral)    Resp 18    Ht 5\' 10"  (1.778 m)    Wt (!) 152.5 kg    LMP 08/25/2020    BMI 48.23 kg/m  No intake/output data recorded. Total I/O In: 469.8 [P.O.:120; I.V.:349.8] Out: -   FHT:  FHR: 130 bpm, variability: marked,  accelerations:  Present,  decelerations:  Absent UC:   regular, every 3-4 minutes SVE:   Dilation: 1 Effacement (%): 50 Station: -3 Exam by:: [email or web address], CNM Foley balloon placed without difficulty. RN filled to 60 cc with sterile fluid.  Pt tolerated well.   Labs: Lab Results  Component Value Date   WBC 5.8 05/21/2021   HGB 11.9 (L) 05/21/2021   HCT 34.9 (L) 05/21/2021   MCV 88.8 05/21/2021   PLT 193 05/21/2021    Assessment / Plan: Induction of labor due to [facility] and oligohydramnios GBS neg S/P Cytotec x 1 buccal  FB in placd  Labor: Progressing normally Preeclampsia:  labs stable Fetal Wellbeing:  Category I Pain Control:  Labor support without medications I/D:   GBS neg Anticipated MOD:  NSVD  [facility] 05/21/2021, 6:14 AM

## 2021-05-22 LAB — CBC
HCT: 24.1 % — ABNORMAL LOW (ref 36.0–46.0)
Hemoglobin: 7.9 g/dL — ABNORMAL LOW (ref 12.0–15.0)
MCH: 29.7 pg (ref 26.0–34.0)
MCHC: 32.8 g/dL (ref 30.0–36.0)
MCV: 90.6 fL (ref 80.0–100.0)
Platelets: 159 10*3/uL (ref 150–400)
RBC: 2.66 MIL/uL — ABNORMAL LOW (ref 3.87–5.11)
RDW: 13.5 % (ref 11.5–15.5)
WBC: 9.9 10*3/uL (ref 4.0–10.5)
nRBC: 0 % (ref 0.0–0.2)

## 2021-05-22 MED ORDER — SODIUM CHLORIDE 0.9 % IV SOLN
500.0000 mg | Freq: Once | INTRAVENOUS | Status: DC
  Filled 2021-05-22: qty 25

## 2021-05-22 MED ORDER — COCONUT OIL OIL: 1.0000 "application " | TOPICAL_OIL | Status: DC | PRN

## 2021-05-22 MED ORDER — ONDANSETRON HCL 4 MG PO TABS
4.0000 mg | ORAL_TABLET | ORAL | Status: DC | PRN
  Administered 2021-05-23: 4 mg via ORAL
  Filled 2021-05-22: qty 1

## 2021-05-22 MED ORDER — FERROUS SULFATE 325 (65 FE) MG PO TABS
325.0000 mg | ORAL_TABLET | Freq: Every day | ORAL | Status: DC
  Administered 2021-05-22: 325 mg via ORAL
  Filled 2021-05-22: qty 1

## 2021-05-22 MED ORDER — SIMETHICONE 80 MG PO CHEW: 80.0000 mg | CHEWABLE_TABLET | ORAL | Status: DC | PRN

## 2021-05-22 MED ORDER — DIBUCAINE (PERIANAL) 1 % EX OINT: 1.0000 "application " | TOPICAL_OINTMENT | CUTANEOUS | Status: DC | PRN

## 2021-05-22 MED ORDER — DIPHENHYDRAMINE HCL 25 MG PO CAPS: 25.0000 mg | ORAL_CAPSULE | Freq: Four times a day (QID) | ORAL | Status: DC | PRN

## 2021-05-22 MED ORDER — METHYLPREDNISOLONE SODIUM SUCC 125 MG IJ SOLR: 125.0000 mg | Freq: Once | INTRAMUSCULAR | Status: DC | PRN

## 2021-05-22 MED ORDER — ALBUTEROL SULFATE (2.5 MG/3ML) 0.083% IN NEBU: 2.5000 mg | INHALATION_SOLUTION | Freq: Once | RESPIRATORY_TRACT | Status: DC | PRN

## 2021-05-22 MED ORDER — IBUPROFEN 600 MG PO TABS
600.0000 mg | ORAL_TABLET | Freq: Four times a day (QID) | ORAL | Status: DC
  Administered 2021-05-22 – 2021-05-23 (×6): 600 mg via ORAL
  Filled 2021-05-22 (×6): qty 1

## 2021-05-22 MED ORDER — SODIUM CHLORIDE 0.9 % IV SOLN
500.0000 mg | Freq: Once | INTRAVENOUS | Status: AC
  Administered 2021-05-22: 500 mg via INTRAVENOUS
  Filled 2021-05-22: qty 25

## 2021-05-22 MED ORDER — EPINEPHRINE PF 1 MG/ML IJ SOLN
0.3000 mg | Freq: Once | INTRAMUSCULAR | Status: DC | PRN
  Filled 2021-05-22: qty 1

## 2021-05-22 MED ORDER — PRENATAL MULTIVITAMIN CH
1.0000 | ORAL_TABLET | Freq: Every day | ORAL | Status: DC
  Administered 2021-05-22 – 2021-05-23 (×2): 1 via ORAL
  Filled 2021-05-22 (×2): qty 1

## 2021-05-22 MED ORDER — BENZOCAINE-MENTHOL 20-0.5 % EX AERO: 1.0000 "application " | INHALATION_SPRAY | CUTANEOUS | Status: DC | PRN

## 2021-05-22 MED ORDER — SODIUM CHLORIDE 0.9 % IV SOLN: INTRAVENOUS | Status: DC | PRN

## 2021-05-22 MED ORDER — SODIUM CHLORIDE 0.9 % IV BOLUS: 500.0000 mL | Freq: Once | INTRAVENOUS | Status: DC | PRN

## 2021-05-22 MED ORDER — WITCH HAZEL-GLYCERIN EX PADS: 1.0000 "application " | MEDICATED_PAD | CUTANEOUS | Status: DC | PRN

## 2021-05-22 MED ORDER — ONDANSETRON HCL 4 MG/2ML IJ SOLN: 4.0000 mg | INTRAMUSCULAR | Status: DC | PRN

## 2021-05-22 MED ORDER — ACETAMINOPHEN 325 MG PO TABS: 650.0000 mg | ORAL_TABLET | ORAL | Status: DC | PRN

## 2021-05-22 NOTE — Progress Notes (Signed)
Post Partum Day 1 TSVD with PPH and SPEC. COVID +, ASX  Subjective: Pt without complaints this morning. Denies HA or visual changes. Denies SOB. Pain controlled. Lochia normal, Voiding without problems  Objective: Blood pressure 135/79, pulse 80, temperature (!) 97.5 F (36.4 C), temperature source Oral, resp. rate 19, height 5\' 10"  (1.778 m), weight (!) 152.5 kg, last menstrual period 08/25/2020, SpO2 100 %.  Physical Exam:  General: alert Lochia: appropriate Uterine Fundus: firm Incision: NA DVT Evaluation: No evidence of DVT seen on physical exam.  Recent Labs    05/21/21 2218 05/22/21 0819  HGB 9.8* 7.9*  HCT 30.3* 24.1*    Assessment/Plan: BP stable without meds. Will complete Magnesium today. Continue to monitor BP afterwards. Continue with iron supplement. Will discuss IV iron replacement with pt as well. Continue with progressive care.    Circumcision Counseling Note  Patient desires circumcision for her female infant.  Circumcision procedure details discussed, risks and benefits of procedure were also discussed.  These include but are not limited to: Benefits of circumcision in men include reduction in the rates of urinary tract infection (UTI), penile cancer, some sexually transmitted infections, penile inflammatory and retractile disorders, as well as easier hygiene.  Risks include bleeding , infection, injury of glans which may lead to penile deformity or urinary tract issues, unsatisfactory cosmetic appearance and other potential complications related to the procedure.  It was emphasized that this is an elective procedure.  Patient wants to proceed with circumcision; written informed consent obtained.  Will do circumcision soon, routine circumcision and post circumcision care ordered for the infant.       LOS: 1 day   05/24/21 05/22/2021, 10:30 AM

## 2021-05-22 NOTE — Lactation Note (Signed)
This note was copied from a baby's chart. Lactation Consultation Note  Patient Name: Joan Dawson [id number] Date: 05/22/2021 Reason for consult: Initial assessment;Difficult latch;Early term 37-38.6wks Age:29 hours  Initial visit to 20 hours old infant of a P2 mother. Mother requests assistance with latch. Demonstrated hand expression, colostrum is easily expressed, collected 5-mL and spoonfed. Infant unable to latch after several attempts. Noted disorganized sucks and retracting tongue.  Discussed normal newborn behavior and patterns, signs of good milk transfer, hunger cues, tummy size and benefits of skin to skin.   Plan: 1-Breastfeeding on demand or 8-12 times in 24h period. 2-Hand express and spoonfeed. 3-Encouraged maternal rest, hydration and food intake.   Contact LC as needed for feeds/support/concerns/questions. All questions answered at this time. Provided Lactation services brochure and promoted [employer] booklet information.     Maternal Data Has patient been taught Hand Expression?: Yes Does the patient have breastfeeding experience prior to this delivery?: Yes How long did the patient breastfeed?: 6 months  Feeding Mother's Current Feeding Choice: Breast Milk  LATCH Score Latch: Too sleepy or reluctant, no latch achieved, no sucking elicited.  Audible Swallowing: None  Type of Nipple: Everted at rest and after stimulation  Comfort (Breast/Nipple): Soft / non-tender  Hold (Positioning): Assistance needed to correctly position infant at breast and maintain latch.  LATCH Score: 5  Interventions Interventions: Breast feeding basics reviewed;Assisted with latch;Skin to skin;Breast massage;Hand express;Adjust position;Breast compression;Expressed milk;Education;LC Services brochure  Discharge Pump: Personal [place] Program: Yes  Consult Status Consult Status: Follow-up Date: 05/23/21 Follow-up type: In-patient    Ilya Ess A Higuera Ancidey 05/22/2021, 5:24  PM

## 2021-05-23 ENCOUNTER — Encounter: Payer: Medicaid Other | Admitting: Advanced Practice Midwife

## 2021-05-23 ENCOUNTER — Other Ambulatory Visit (HOSPITAL_COMMUNITY): Payer: Self-pay

## 2021-05-23 MED ORDER — IBUPROFEN 600 MG PO TABS
600.0000 mg | ORAL_TABLET | Freq: Four times a day (QID) | ORAL | 0 refills | Status: DC | PRN
  Filled 2021-05-23: qty 30, 8d supply, fill #0

## 2021-05-23 MED ORDER — LABETALOL HCL 100 MG PO TABS
100.0000 mg | ORAL_TABLET | Freq: Two times a day (BID) | ORAL | Status: DC
  Administered 2021-05-23: 100 mg via ORAL
  Filled 2021-05-23: qty 1

## 2021-05-23 MED ORDER — LABETALOL HCL 100 MG PO TABS
100.0000 mg | ORAL_TABLET | Freq: Two times a day (BID) | ORAL | 0 refills | Status: DC
  Filled 2021-05-23: qty 60, 30d supply, fill #0

## 2021-05-23 MED ORDER — NORETHINDRONE 0.35 MG PO TABS
1.0000 | ORAL_TABLET | Freq: Every day | ORAL | 4 refills | Status: DC
  Filled 2021-05-23: qty 84, 84d supply, fill #0

## 2021-05-23 MED ORDER — ACETAMINOPHEN 325 MG PO TABS: 650.0000 mg | ORAL_TABLET | ORAL | Status: DC | PRN

## 2021-05-23 MED ORDER — FUROSEMIDE 40 MG PO TABS
20.0000 mg | ORAL_TABLET | Freq: Every day | ORAL | Status: DC
  Administered 2021-05-23: 20 mg via ORAL
  Filled 2021-05-23: qty 1

## 2021-05-23 MED ORDER — FUROSEMIDE 20 MG PO TABS
20.0000 mg | ORAL_TABLET | Freq: Every day | ORAL | 0 refills | Status: DC
  Filled 2021-05-23: qty 4, 4d supply, fill #0

## 2021-05-23 NOTE — Lactation Note (Signed)
This note was copied from a baby's chart. Lactation Consultation Note  Patient Name: Joan Dawson Today's Date: 05/23/2021   Age:29 hours  Mom says she is comfortable with latch. Mom reports that with her older child (now 2.5 yo) her milk came to volume on the 2nd day. Mom is aware that her milk may not come to volume as quickly this time due to her PPH (of almost 60 oz).   Mom declined for me to return for a latch check. Mom says she knows how to reach [place] for post-discharge questions and she has a hands-free bra at home  Larkin Ina 05/23/2021, 8:38 AM

## 2021-05-23 NOTE — Progress Notes (Signed)
Circumcision Consent  Discussed with mom at bedside about circumcision.   Circumcision is a surgery that removes the skin that covers the tip of the penis, called the "foreskin." Circumcision is usually done when a boy is between 1 and 10 days old, sometimes up to 3-4 weeks old.  The most common reasons boys are circumcised include for cultural/religious beliefs or for parental preference (potentially easier to clean, so baby looks like daddy, etc).  There may be some medical benefits for circumcision:   Circumcised boys seem to have slightly lower rates of: ? Urinary tract infections (per the American Academy of Pediatrics an uncircumcised boy has a 1/100 chance of developing a UTI in the first year of life, a circumcised boy at a 04/998 chance of developing a UTI in the first year of life- a 10% reduction) ? Penis cancer (typically rare- an uncircumcised female has a 1 in 100,000 chance of developing cancer of the penis) ? Sexually transmitted infection (in endemic areas, including HIV, HPV and Herpes- circumcision does NOT protect against gonorrhea, chlamydia, trachomatis, or syphilis) ? Phimosis: a condition where that makes retraction of the foreskin over the glans impossible (0.4 per 1000 boys per year or 0.6% of boys are affected by their 15th birthday)  Boys and men who are not circumcised can reduce these extra risks by: ? Cleaning their penis well ? Using condoms during sex  What are the risks of circumcision?  As with any surgical procedure, there are risks and complications. In circumcision, complications are rare and usually minor, the most common being: ? Bleeding- risk is reduced by holding each clamp for 30 seconds prior to a cut being made, and by holding pressure after the procedure is done ? Infection- the penis is cleaned prior to the procedure, and the procedure is done under sterile technique ? Damage to the urethra or amputation of the penis   All questions were  answered and mother consented.  Talma Aguillard, DO Attending Obstetrician & Gynecologist, [facility], [facility]    

## 2021-05-23 NOTE — Progress Notes (Signed)
Mother given discharge instructions, plan is for infant to remain baby patient. Mom states BP cuff given parentally is at home. RN gave mom new BP kit to take BP while in the hospital with infant. Pt. given instructions on how to use kit and enter data into BabyScript app.

## 2021-05-24 LAB — SURGICAL PATHOLOGY

## 2021-05-28 ENCOUNTER — Other Ambulatory Visit: Payer: Self-pay | Admitting: Family Medicine

## 2021-05-28 MED ORDER — LABETALOL HCL 100 MG PO TABS: 200.0000 mg | ORAL_TABLET | Freq: Two times a day (BID) | ORAL | 0 refills | Status: DC

## 2021-05-28 NOTE — Progress Notes (Signed)
Increase BP meds given persistent mild range BP's since discharge

## 2021-05-30 ENCOUNTER — Encounter: Payer: Self-pay | Admitting: Family Medicine

## 2021-05-30 ENCOUNTER — Encounter: Payer: Medicaid Other | Admitting: Advanced Practice Midwife

## 2021-06-01 ENCOUNTER — Other Ambulatory Visit: Payer: Self-pay

## 2021-06-01 ENCOUNTER — Telehealth (HOSPITAL_COMMUNITY): Payer: Self-pay | Admitting: *Deleted

## 2021-06-01 ENCOUNTER — Ambulatory Visit (INDEPENDENT_AMBULATORY_CARE_PROVIDER_SITE_OTHER): Payer: Medicaid Other | Admitting: *Deleted

## 2021-06-01 VITALS — BP 130/81 | HR 78

## 2021-06-01 DIAGNOSIS — D508 Other iron deficiency anemias: Secondary | ICD-10-CM

## 2021-06-01 DIAGNOSIS — O10913 Unspecified pre-existing hypertension complicating pregnancy, third trimester: Secondary | ICD-10-CM

## 2021-06-01 MED ORDER — FERROUS SULFATE 325 (65 FE) MG PO TABS: 325.0000 mg | ORAL_TABLET | ORAL | 0 refills | Status: DC

## 2021-06-01 MED ORDER — LABETALOL HCL 200 MG PO TABS: 200.0000 mg | ORAL_TABLET | Freq: Two times a day (BID) | ORAL | 1 refills | Status: DC

## 2021-06-01 NOTE — Progress Notes (Signed)
Subjective:  Joan Dawson is a 31 y.o. female here for BP check.   Hypertension ROS: taking medications as instructed, no medication side effects noted, no TIA's, no chest pain on exertion, no dyspnea on exertion, and no swelling of ankles.    Objective:  BP 130/81    Pulse 78    LMP 08/25/2020   Appearance alert, well appearing, and in no distress, oriented to person, place, and time, and overweight. General exam BP noted to be well controlled today in office.    Assessment:   Blood Pressure stable.   Plan:  Current treatment plan is effective, no change in therapy.  RX sent for Labetalol 200 mg BID and for ferrous sulfate 325 mg EOD per Dr. Alysia Penna.

## 2021-06-01 NOTE — Telephone Encounter (Signed)
Hospital Discharge Follow-Up Call:  Patient reports that she is doing better.  She has already been seen for a BP check in the office and says that she is taking her medication as prescribed.  She had questions about the volume of lochia she is having.  We discussed normals and when she should call her provider.  EPDS today was 1 and she endorses that this score accurately reflects that she is doing well emotionally.  Patient says that baby is well and she has no concerns about baby's health.  She reports that baby sleeps in a bassinet beside her bed.  Reviewed ABCs of Safe Sleep.

## 2021-06-02 ENCOUNTER — Encounter: Payer: Self-pay | Admitting: Family Medicine

## 2021-07-05 ENCOUNTER — Encounter: Payer: Self-pay | Admitting: Obstetrics

## 2021-07-05 ENCOUNTER — Ambulatory Visit (INDEPENDENT_AMBULATORY_CARE_PROVIDER_SITE_OTHER): Payer: Medicaid Other | Admitting: Obstetrics

## 2021-07-05 ENCOUNTER — Other Ambulatory Visit: Payer: Self-pay

## 2021-07-05 DIAGNOSIS — Z3009 Encounter for other general counseling and advice on contraception: Secondary | ICD-10-CM

## 2021-07-05 DIAGNOSIS — Z30013 Encounter for initial prescription of injectable contraceptive: Secondary | ICD-10-CM

## 2021-07-05 DIAGNOSIS — D508 Other iron deficiency anemias: Secondary | ICD-10-CM

## 2021-07-05 DIAGNOSIS — G43009 Migraine without aura, not intractable, without status migrainosus: Secondary | ICD-10-CM

## 2021-07-05 LAB — POCT URINE PREGNANCY: Preg Test, Ur: NEGATIVE

## 2021-07-05 MED ORDER — MEDROXYPROGESTERONE ACETATE 150 MG/ML IM SUSP
150.0000 mg | INTRAMUSCULAR | 4 refills | Status: DC
Start: 2021-07-05 — End: 2022-08-21

## 2021-07-05 MED ORDER — MEDROXYPROGESTERONE ACETATE 150 MG/ML IM SUSP
150.0000 mg | Freq: Once | INTRAMUSCULAR | Status: AC
  Administered 2021-07-05: 150 mg via INTRAMUSCULAR

## 2021-07-05 MED ORDER — IBUPROFEN 600 MG PO TABS: 600.0000 mg | ORAL_TABLET | Freq: Four times a day (QID) | ORAL | 5 refills | Status: DC | PRN

## 2021-07-05 NOTE — Addendum Note (Signed)
Addended by: Harrel Lemon on: 07/05/2021 11:35 AM ? ? Modules accepted: Orders ? ?

## 2021-07-05 NOTE — Progress Notes (Addendum)
Wants to restart Depo ?Needs refill ibuprofen for headaches, ordered per vo Dr. Clearance Coots ?Reports occ RUQ pain after taking labetalol ?

## 2021-07-05 NOTE — Progress Notes (Signed)
? ? ?Post Partum Visit Note ? ?Joan Dawson is a 31 y.o. G1P1001 female who presents for a postpartum visit. She is 6 weeks postpartum following a normal spontaneous vaginal delivery.  I have fully reviewed the prenatal and intrapartum course. The delivery was at 38.3 gestational weeks.  Anesthesia: none. Postpartum course has been uncomplicated. Baby is doing well yes. Baby is feeding by bottle - gerber gentle soy . Bleeding staining only. Bowel function is normal. Bladder function is normal. Patient is not sexually active. Contraception method is oral progesterone-only contraceptive. Postpartum depression screening: negative. ? ? ?The pregnancy intention screening data noted above was reviewed. Potential methods of contraception were discussed. The patient elected to proceed with No data recorded. ? ? ? ?Health Maintenance Due  ?Topic Date Due  ? COVID-19 Vaccine (1) Never done  ? INFLUENZA VACCINE  Never done  ? ? ?The following portions of the patient's history were reviewed and updated as appropriate: allergies, current medications, past family history, past medical history, past social history, past surgical history, and problem list. ? ?Review of Systems ?A comprehensive review of systems was negative. ? ?Objective:  ?BP 133/85   Pulse 87   Ht 5' 9.5" (1.765 m)   Wt (!) 308 lb (139.7 kg)   LMP 08/25/2020 Comment: postpartum  Breastfeeding No   BMI 44.83 kg/m?   ? ?General:  alert and no distress  ? Breasts:  normal  ?Lungs: clear to auscultation bilaterally  ?Heart:  regular rate and rhythm, S1, S2 normal, no murmur, click, rub or gallop  ?Abdomen: soft, non-tender; bowel sounds normal; no masses,  no organomegaly   ?Wound none  ?GU exam:  not indicated  ?     ?Assessment:  ? ? 1. Postpartum care following vaginal delivery ? ?2. Encounter for other general counseling or advice on contraception ?- wants Depo ? ?3. Encounter for initial prescription of injectable contraceptive ?Rx: ?- POCT urine  pregnancy ?- medroxyPROGESTERone (DEPO-PROVERA) injection 150 mg ?- medroxyPROGESTERone (DEPO-PROVERA) 150 MG/ML injection; Inject 1 mL (150 mg total) into the muscle every 3 (three) months.  Dispense: 1 mL; Refill: 4 ? ?4. Other iron deficiency anemia ?- history of postpartum hemorrhage.  Received iron transfusion ?- taking po iron  ?- check CBC next visit ? ? ?Plan:  ? ?Essential components of care per ACOG recommendations: ? ?1.  Mood and well being: Patient with negative depression screening today. Reviewed local resources for support.  ?- Patient tobacco use? No.   ?- hx of drug use? No.   ? ?2. Infant care and feeding:  ?-Patient currently breastmilk feeding? No.  ?-Social determinants of health (SDOH) reviewed in EPIC. No concerns ? ?3. Sexuality, contraception and birth spacing ?- Patient does not want a pregnancy in the next year.  Desired family size is unknown number of children.  ?- Reviewed reproductive life planning. Reviewed contraceptive methods based on pt preferences and effectiveness.  Patient desired Depo-Provera today.   ?- Discussed birth spacing of 18 months ? ?4. Sleep and fatigue ?-Encouraged family/partner/community support of 4 hrs of uninterrupted sleep to help with mood and fatigue ? ?5. Physical Recovery  ?- Discussed patients delivery and complications. She describes her labor as good. ?- Patient had a Vaginal problems after delivery including postpartum hemorrhage . Patient had a  bilateral periurethral  lacerations.  ?- Patient has urinary incontinence? No. ?- Patient is safe to resume physical and sexual activity ? ?6.  Health Maintenance ?- HM due items addressed Yes ?-  Last pap smear  ?Diagnosis  ?Date Value Ref Range Status  ?11/03/2020   Final  ? - Negative for intraepithelial lesion or malignancy (NILM)  ? Pap smear not done at today's visit.  ?-Breast Cancer screening indicated? No.  ? ?7. Chronic Disease/Pregnancy Condition follow up: None ? ? ?Coral Ceo, MD ?Center for  [facility], [facility], [facility] ?07/05/21  ?

## 2021-08-05 ENCOUNTER — Ambulatory Visit (INDEPENDENT_AMBULATORY_CARE_PROVIDER_SITE_OTHER): Payer: Medicaid Other | Admitting: Obstetrics

## 2021-08-05 ENCOUNTER — Encounter: Payer: Self-pay | Admitting: Obstetrics

## 2021-08-05 DIAGNOSIS — J301 Allergic rhinitis due to pollen: Secondary | ICD-10-CM | POA: Diagnosis not present

## 2021-08-05 DIAGNOSIS — Z6841 Body Mass Index (BMI) 40.0 and over, adult: Secondary | ICD-10-CM

## 2021-08-05 DIAGNOSIS — Z3042 Encounter for surveillance of injectable contraceptive: Secondary | ICD-10-CM | POA: Diagnosis not present

## 2021-08-05 MED ORDER — CETIRIZINE HCL 10 MG PO TABS: 10.0000 mg | ORAL_TABLET | Freq: Every day | ORAL | 11 refills | Status: AC

## 2021-08-05 NOTE — Progress Notes (Signed)
? ? ?Post Partum Visit Note ? ?Joan Dawson is a 31 y.o. G51P1001 female who presents for a postpartum visit. She is  11  weeks postpartum following a normal spontaneous vaginal delivery.  I have fully reviewed the prenatal and intrapartum course. The delivery was at [redacted]w[redacted]d gestational weeks.  Anesthesia: none. Postpartum course has been unremarkable. Baby is doing well. Baby is feeding by  Soy . Bleeding  spotting . Bowel function is normal. Bladder function is normal. Patient is sexually active. Contraception method is Depo-Provera injections. Postpartum depression screening: negative. EPDS= 0 ? ? ?The pregnancy intention screening data noted above was reviewed. Potential methods of contraception were discussed. The patient elected to proceed with No data recorded. ? ? Edinburgh Postnatal Depression Scale - 08/05/21 1113   ? ?  ? Edinburgh Postnatal Depression Scale:  In the Past 7 Days  ? I have been able to laugh and see the funny side of things. 0   ? I have looked forward with enjoyment to things. 0   ? I have blamed myself unnecessarily when things went wrong. 0   ? I have been anxious or worried for no good reason. 0   ? I have felt scared or panicky for no good reason. 0   ? Things have been getting on top of me. 0   ? I have been so unhappy that I have had difficulty sleeping. 0   ? I have felt sad or miserable. 0   ? I have been so unhappy that I have been crying. 0   ? The thought of harming myself has occurred to me. 0   ? Edinburgh Postnatal Depression Scale Total 0   ? ?  ?  ? ?  ? ? ?Health Maintenance Due  ?Topic Date Due  ? COVID-19 Vaccine (1) Never done  ? ? ?The following portions of the patient's history were reviewed and updated as appropriate: allergies, current medications, past family history, past medical history, past social history, past surgical history, and problem list. ? ?Review of Systems ?A comprehensive review of systems was negative. ? ?Objective:  ?BP 126/84   Pulse 84   Ht  5\' 9"  (1.753 m)   Wt (!) 306 lb (138.8 kg)   LMP 08/25/2020   Breastfeeding No   BMI 45.19 kg/m?   ? ?General:  alert and no distress  ? Breasts:  normal  ?Lungs: clear to auscultation bilaterally  ?Heart:  regular rate and rhythm, S1, S2 normal, no murmur, click, rub or gallop  ?Abdomen: soft, non-tender; bowel sounds normal; no masses,  no organomegaly   ?Wound none  ?GU exam:  not indicated  ?     ?Assessment:  ? ? 1. Postpartum care following vaginal delivery ?- doing well ? ?2. Encounter for surveillance of injectable contraceptive ?- pleased with Depo ? ?3. Class 3 severe obesity due to excess calories without serious comorbidity with body mass index (BMI) of 45.0 to 49.9 in adult [facility]) ?- weight reduction with the aid of dietary changes, exercise and behavioral modification recommended ? ?4. Seasonal allergic rhinitis due to pollen ?Rx: ?- cetirizine (ZYRTEC ALLERGY) 10 MG tablet; Take 1 tablet (10 mg total) by mouth daily.  Dispense: 30 tablet; Refill: 11  ? ? ?Plan:  ? ?Essential components of care per ACOG recommendations: ? ?1.  Mood and well being: Patient with negative depression screening today. Reviewed local resources for support.  ?- Patient tobacco use? No.   ?- hx of  drug use? No.   ? ?2. Infant care and feeding:  ?-Patient currently breastmilk feeding? No.  ?-Social determinants of health (SDOH) reviewed in EPIC. No concerns ? ?3. Sexuality, contraception and birth spacing ?- Patient does not want a pregnancy in the next year.  Desired family size is 2 children.  ?- Reviewed reproductive life planning. Reviewed contraceptive methods based on pt preferences and effectiveness.  Patient desired Hormonal Injection today.   ?- Discussed birth spacing of 18 months ? ?4. Sleep and fatigue ?-Encouraged family/partner/community support of 4 hrs of uninterrupted sleep to help with mood and fatigue ? ?5. Physical Recovery  ?- Discussed patients delivery and complications. She describes her labor as  good. ?- Patient had a Vaginal, no problems at delivery. Patient had a  periurethral  laceration. Perineal healing reviewed. Patient expressed understanding ?- Patient has urinary incontinence? No. ?- Patient is safe to resume physical and sexual activity ? ?6.  Health Maintenance ?- HM due items addressed Yes ?- Last pap smear  ?Diagnosis  ?Date Value Ref Range Status  ?11/03/2020   Final  ? - Negative for intraepithelial lesion or malignancy (NILM)  ? Pap smear not done at today's visit.  ?-Breast Cancer screening indicated? No.  ? ?7. Chronic Disease/Pregnancy Condition follow up: Hypertension ? ?- PCP follow up ? ?Coral Ceo, MD ?Center for [facility], [facility], [facility] ?08/05/21  ? ?

## 2021-09-20 ENCOUNTER — Other Ambulatory Visit: Payer: Self-pay | Admitting: Obstetrics

## 2021-09-20 DIAGNOSIS — Z30013 Encounter for initial prescription of injectable contraceptive: Secondary | ICD-10-CM

## 2021-09-23 ENCOUNTER — Ambulatory Visit (INDEPENDENT_AMBULATORY_CARE_PROVIDER_SITE_OTHER): Payer: Medicaid Other | Admitting: Emergency Medicine

## 2021-09-23 VITALS — BP 142/83 | Wt 316.2 lb

## 2021-09-23 DIAGNOSIS — Z3042 Encounter for surveillance of injectable contraceptive: Secondary | ICD-10-CM | POA: Diagnosis not present

## 2021-09-23 MED ORDER — MEDROXYPROGESTERONE ACETATE 150 MG/ML IM SUSP
150.0000 mg | Freq: Once | INTRAMUSCULAR | Status: AC
  Administered 2021-09-23: 150 mg via INTRAMUSCULAR

## 2021-09-23 NOTE — Progress Notes (Signed)
Agree with nurses's documentation of this patient's clinic encounter.  Kennis Wissmann L, MD  

## 2021-09-23 NOTE — Progress Notes (Signed)
Date last pap: 11/03/2020. Last Depo-Provera: 07/05/2021. Side Effects if any: Bleeding. Serum HCG indicated? NA. Depo-Provera 150 mg IM given by: Leavy Cella, RN into left arm with no adverse reactions.. Next appointment due Aug 18-Sept 1.

## 2021-12-14 ENCOUNTER — Ambulatory Visit: Payer: Medicaid Other

## 2021-12-15 ENCOUNTER — Ambulatory Visit (INDEPENDENT_AMBULATORY_CARE_PROVIDER_SITE_OTHER): Payer: Medicaid Other

## 2021-12-15 VITALS — BP 119/80 | HR 80 | Wt 316.0 lb

## 2021-12-15 DIAGNOSIS — Z3042 Encounter for surveillance of injectable contraceptive: Secondary | ICD-10-CM

## 2021-12-15 MED ORDER — MEDROXYPROGESTERONE ACETATE 150 MG/ML IM SUSP
150.0000 mg | Freq: Once | INTRAMUSCULAR | Status: AC
  Administered 2021-12-15: 150 mg via INTRAMUSCULAR

## 2021-12-15 NOTE — Progress Notes (Addendum)
Subjective:  Pt in for Depo Provera injection.    Objective: Need for contraception. No unusual complaints.    Assessment: Depo given L Del. Pt tolerated Depo injection.   Plan:  Next injection due 11/9-11/23/23.    Administrations This Visit     medroxyPROGESTERone (DEPO-PROVERA) injection 150 mg     Admin Date 12/15/2021 Action Given Dose 150 mg Route Intramuscular Administered By Lewayne Bunting, CMA

## 2022-03-09 ENCOUNTER — Encounter: Payer: Self-pay | Admitting: Obstetrics and Gynecology

## 2022-03-09 ENCOUNTER — Other Ambulatory Visit (HOSPITAL_COMMUNITY)
Admission: RE | Admit: 2022-03-09 | Discharge: 2022-03-09 | Disposition: A | Discharge status: Home / Self Care | Payer: No Typology Code available for payment source | Source: Ambulatory Visit | Attending: Obstetrics and Gynecology | Admitting: Obstetrics and Gynecology

## 2022-03-09 ENCOUNTER — Ambulatory Visit: Payer: Medicaid Other

## 2022-03-09 ENCOUNTER — Ambulatory Visit: Payer: PRIVATE HEALTH INSURANCE | Admitting: Obstetrics and Gynecology

## 2022-03-09 ENCOUNTER — Ambulatory Visit (INDEPENDENT_AMBULATORY_CARE_PROVIDER_SITE_OTHER): Payer: PRIVATE HEALTH INSURANCE | Admitting: Obstetrics and Gynecology

## 2022-03-09 VITALS — BP 132/87 | HR 82 | Ht 69.0 in | Wt 323.4 lb

## 2022-03-09 DIAGNOSIS — Z8619 Personal history of other infectious and parasitic diseases: Secondary | ICD-10-CM | POA: Diagnosis not present

## 2022-03-09 DIAGNOSIS — Z01419 Encounter for gynecological examination (general) (routine) without abnormal findings: Secondary | ICD-10-CM

## 2022-03-09 DIAGNOSIS — Z3042 Encounter for surveillance of injectable contraceptive: Secondary | ICD-10-CM

## 2022-03-09 MED ORDER — MEDROXYPROGESTERONE ACETATE 150 MG/ML IM SUSP
150.0000 mg | Freq: Once | INTRAMUSCULAR | Status: AC
  Administered 2022-03-09: 150 mg via INTRAMUSCULAR

## 2022-03-09 NOTE — Progress Notes (Signed)
Patient presents for AEX. No concerns today. Last Pap: 11/03/20 Normal

## 2022-03-11 NOTE — Progress Notes (Signed)
   WELL-WOMAN PHYSICAL & PAP Patient name: Joan Dawson MRN [id number]  Date of birth: 1990/09/28 Chief Complaint:   Gynecologic Exam  History of Present Illness:   Joan Dawson is a 31 y.o. G4P2002 Caucasian female being seen today for a routine well-woman exam.  Current complaints: Depo injection  PCP: none      does not desire labs No LMP recorded. Patient has had an injection. The current method of family planning is Depo-Provera injections.  Last pap 10/2020. Results were: normal. Previous pap 06/2019 normal with HRHPV. Last mammogram: n/a. Family h/o breast cancer: No Last colonoscopy: n/a. Family h/o colorectal cancer: No Review of Systems:   Pertinent items are noted in HPI Denies any headaches, blurred vision, fatigue, shortness of breath, chest pain, abdominal pain, abnormal vaginal discharge/itching/odor/irritation, problems with periods, bowel movements, urination, or intercourse unless otherwise stated above. Pertinent History Reviewed:  Reviewed past medical,surgical, social and family history.  Reviewed problem list, medications and allergies. Physical Assessment:   Vitals:   03/09/22 1043  BP: 132/87  Pulse: 82  Weight: (!) 323 lb 6.4 oz (146.7 kg)  Height: 5\' 9"  (1.753 m)  Body mass index is 47.76 kg/m.        Physical Examination:   General appearance - well appearing, and in no distress  Mental status - alert, oriented to person, place, and time  Psych:  She has a normal mood and affect  Skin - warm and dry, normal color, no suspicious lesions noted  Chest - effort normal, all lung fields clear to auscultation bilaterally  Heart - normal rate and regular rhythm  Neck:  midline trachea, no thyromegaly or nodules  Breasts - breasts appear normal, no suspicious masses, no skin or nipple changes or  axillary nodes  Abdomen - soft, nontender, nondistended, no masses or organomegaly  Pelvic - VULVA: normal appearing vulva with no masses, tenderness or lesions   VAGINA: normal appearing vagina with normal color and discharge, no lesions  CERVIX: normal appearing cervix without discharge or lesions, no CMT  Thin prep pap is done with HR HPV cotesting  UTERUS: uterus is felt to be normal size, shape, consistency and nontender   ADNEXA: No adnexal masses or tenderness noted.  Rectal - deferred  Extremities:  No swelling or varicosities noted  No results found for this or any previous visit (from the past 24 hour(s)).  Assessment & Plan:  1. Well woman exam with routine gynecological exam - Explained that according to ASCCP guidelines, it is recommended for a repeat pap today. - Cytology - PAP( [facility])  2. 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 Depo-Provera contraceptive status - medroxyPROGESTERone (DEPO-PROVERA) injection 150 mg   Labs/procedures today: Depo injection  Mammogram at age 28 or sooner if problems Colonoscopy at age 53 or sooner if problems  No orders of the defined types were placed in this encounter.   Meds:  Meds ordered this encounter  Medications   medroxyPROGESTERone (DEPO-PROVERA) injection 150 mg    Follow-up: Return in about 1 year (around 03/10/2023) for Annual Exam.  03/12/2023 MSN, CNM 03/11/2022 10:03 AM

## 2022-03-13 LAB — CYTOLOGY - PAP: Diagnosis: NEGATIVE

## 2022-03-15 ENCOUNTER — Encounter: Payer: Self-pay | Admitting: Obstetrics and Gynecology

## 2022-05-07 ENCOUNTER — Ambulatory Visit
Admission: EM | Admit: 2022-05-07 | Discharge: 2022-05-07 | Disposition: A | Discharge status: Home / Self Care | Payer: Medicaid Other | Attending: Family Medicine | Admitting: Family Medicine

## 2022-05-07 DIAGNOSIS — J3089 Other allergic rhinitis: Secondary | ICD-10-CM | POA: Diagnosis not present

## 2022-05-07 DIAGNOSIS — H6993 Unspecified Eustachian tube disorder, bilateral: Secondary | ICD-10-CM

## 2022-05-07 MED ORDER — PREDNISONE 50 MG PO TABS: ORAL_TABLET | ORAL | 0 refills | Status: DC

## 2022-05-07 MED ORDER — FLUTICASONE PROPIONATE 50 MCG/ACT NA SUSP: 1.0000 | Freq: Two times a day (BID) | NASAL | 2 refills | Status: DC

## 2022-05-07 NOTE — ED Triage Notes (Signed)
Pt reports bilateral ear pain mainly in left ear with clear drainage x 4 weeks.

## 2022-05-07 NOTE — Discharge Instructions (Addendum)
In addition to taking the prescribed medications, make sure you are taking your antihistamine daily and Sudafed as needed

## 2022-05-10 NOTE — ED Provider Notes (Signed)
[facility]    CSN: [id number] Arrival date & time: 05/07/22  0848      History   Chief Complaint No chief complaint on file.   HPI Joan Dawson is a 32 y.o. female.   Patient presenting today with bilateral ear pain and pressure, popping sensation for the past month.  Denies sharp stabbing pain, drainage, headache, fever, chills, loss of hearing.  So far tried Tylenol as needed with minimal relief.  Occasionally has nasal congestion additionally.    Past Medical History:  Diagnosis Date   ADHD    Hypertension    Migraines    Scoliosis     Patient Active Problem List   Diagnosis Date Noted   Encounter for supervision of high risk pregnancy in third trimester, antepartum 05/21/2021   Oligohydramnios 05/21/2021   Postpartum hemorrhage 05/21/2021   LGA (large for gestational age) fetus affecting management of mother 03/02/2021   Chronic hypertension with superimposed preeclampsia 12/01/2020   Obesity in pregnancy 11/03/2020   Supervision of other normal pregnancy, antepartum 10/27/2020   Right knee pain 12/02/2019   History of migraine 02/18/2018    Past Surgical History:  Procedure Laterality Date   NO PAST SURGERIES      OB History     Gravida  2   Para  2   Term  2   Preterm      AB      Living  2      SAB      IAB      Ectopic      Multiple  0   Live Births  2            Home Medications    Prior to Admission medications   Medication Sig Start Date End Date Taking? Authorizing Provider  fluticasone (FLONASE) 50 MCG/ACT nasal spray Place 1 spray into both nostrils 2 (two) times daily. 05/07/22  Yes Particia Nearing, PA-C  predniSONE (DELTASONE) 50 MG tablet Take 1 tab daily with breakfast for 3 days 05/07/22  Yes Particia Nearing, PA-C  acetaminophen (TYLENOL) 325 MG tablet Take 2 tablets (650 mg total) by mouth every 4 (four) hours as needed (for pain scale < 4). Patient not taking: Reported on 07/05/2021  05/23/21   Myna Hidalgo, DO  cetirizine (ZYRTEC ALLERGY) 10 MG tablet Take 1 tablet (10 mg total) by mouth daily. Patient not taking: Reported on 03/09/2022 08/05/21   Brock Bad, MD  furosemide (LASIX) 20 MG tablet Take 1 tablet (20 mg total) by mouth daily for 4 days. 05/23/21 05/27/21  Myna Hidalgo, DO  medroxyPROGESTERone (DEPO-PROVERA) 150 MG/ML injection Inject 1 mL (150 mg total) into the muscle every 3 (three) months. 07/05/21   Brock Bad, MD    Family History Family History  Problem Relation Age of Onset   Diabetes Mother    Uterine cancer Mother    Hypertension Mother    Crohn's disease Sister    Hypertension Sister    Hypertension Brother     Social History Social History   Tobacco Use   Smoking status: Former    Types: Cigars    Quit date: 06/2020    Years since quitting: 1.8    Passive exposure: Past   Smokeless tobacco: Never   Tobacco comments:    BLACK N MILD  Vaping Use   Vaping Use: Every day  Substance Use Topics   Alcohol use: Not Currently    Comment: not since  confirmed pregnancy   Drug use: No     Allergies   Patient has no known allergies.   Review of Systems Review of Systems PER HPI  Physical Exam Triage Vital Signs ED Triage Vitals  Enc Vitals Group     BP 05/07/22 0852 126/85     Pulse Rate 05/07/22 0852 96     Resp 05/07/22 0852 20     Temp 05/07/22 0852 98.2 F (36.8 C)     Temp Source 05/07/22 0852 Oral     SpO2 05/07/22 0852 96 %     Weight --      Height --      Head Circumference --      Peak Flow --      Pain Score 05/07/22 0855 4     Pain Loc --      Pain Edu? --      Excl. in [place]? --    No data found.  Updated Vital Signs BP 126/85 (BP Location: Right Arm)   Pulse 96   Temp 98.2 F (36.8 C) (Oral)   Resp 20   LMP 05/04/2022 (Exact Date)   SpO2 96%   Visual Acuity Right Eye Distance:   Left Eye Distance:   Bilateral Distance:    Right Eye Near:   Left Eye Near:    Bilateral Near:      Physical Exam Vitals and nursing note reviewed.  Constitutional:      Appearance: Normal appearance. She is not ill-appearing.  HENT:     Head: Atraumatic.     Ears:     Comments: Bilateral middle ear effusions, worse on the right    Nose:     Comments: Bilateral nasal turbinates boggy and erythematous    Mouth/Throat:     Mouth: Mucous membranes are moist.     Pharynx: Oropharynx is clear.  Eyes:     Extraocular Movements: Extraocular movements intact.     Conjunctiva/sclera: Conjunctivae normal.  Cardiovascular:     Rate and Rhythm: Normal rate and regular rhythm.     Heart sounds: Normal heart sounds.  Pulmonary:     Effort: Pulmonary effort is normal.     Breath sounds: Normal breath sounds.  Musculoskeletal:        General: Normal range of motion.     Cervical back: Normal range of motion and neck supple.  Skin:    General: Skin is warm and dry.  Neurological:     Mental Status: She is alert and oriented to person, place, and time.     Motor: No weakness.     Gait: Gait normal.  Psychiatric:        Mood and Affect: Mood normal.        Thought Content: Thought content normal.        Judgment: Judgment normal.      UC Treatments / Results  Labs (all labs ordered are listed, but only abnormal results are displayed) Labs Reviewed - No data to display  EKG   Radiology No results found.  Procedures Procedures (including critical care time)  Medications Ordered in UC Medications - No data to display  Initial Impression / Assessment and Plan / UC Course  I have reviewed the triage vital signs and the nursing notes.  Pertinent labs & imaging results that were available during my care of the patient were reviewed by me and considered in my medical decision making (see chart for details).     Start  a short burst of prednisone, Flonase twice daily and antihistamine ongoing, Sudafed as needed.  Return for worsening symptoms.  Final Clinical Impressions(s)  / UC Diagnoses   Final diagnoses:  Acute dysfunction of Eustachian tube, bilateral  Seasonal allergic rhinitis due to other allergic trigger     Discharge Instructions      In addition to taking the prescribed medications, make sure you are taking your antihistamine daily and Sudafed as needed    ED Prescriptions     Medication Sig Dispense Auth. Provider   predniSONE (DELTASONE) 50 MG tablet Take 1 tab daily with breakfast for 3 days 3 tablet Volney American, PA-C   fluticasone [facility]) 50 MCG/ACT nasal spray Place 1 spray into both nostrils 2 (two) times daily. 16 g Volney American, [place]      PDMP not reviewed this encounter.   Volney American, [place] 05/10/22 1507

## 2022-05-16 ENCOUNTER — Ambulatory Visit (HOSPITAL_COMMUNITY)
Admission: EM | Admit: 2022-05-16 | Discharge: 2022-05-16 | Disposition: A | Discharge status: Home / Self Care | Payer: Medicaid Other | Attending: Family Medicine | Admitting: Family Medicine

## 2022-05-16 ENCOUNTER — Encounter (HOSPITAL_COMMUNITY): Payer: Self-pay

## 2022-05-16 DIAGNOSIS — R519 Headache, unspecified: Secondary | ICD-10-CM | POA: Insufficient documentation

## 2022-05-16 DIAGNOSIS — Z1152 Encounter for screening for COVID-19: Secondary | ICD-10-CM | POA: Diagnosis not present

## 2022-05-16 DIAGNOSIS — J0100 Acute maxillary sinusitis, unspecified: Secondary | ICD-10-CM | POA: Insufficient documentation

## 2022-05-16 DIAGNOSIS — H9209 Otalgia, unspecified ear: Secondary | ICD-10-CM | POA: Insufficient documentation

## 2022-05-16 MED ORDER — PREDNISONE 20 MG PO TABS: 40.0000 mg | ORAL_TABLET | Freq: Every day | ORAL | 0 refills | Status: AC

## 2022-05-16 MED ORDER — AMOXICILLIN-POT CLAVULANATE 875-125 MG PO TABS: 1.0000 | ORAL_TABLET | Freq: Two times a day (BID) | ORAL | 0 refills | Status: AC

## 2022-05-16 NOTE — ED Triage Notes (Signed)
Patient reports that she has had bilateral ear pain x 6 weeks. L>R, Patient states she went to [facility] and was given medications.  Patient states she also developed an intermittent headache, nasal congestion, and sore throat x 4 days.

## 2022-05-16 NOTE — ED Provider Notes (Addendum)
[facility]    CSN: [id number] Arrival date & time: 05/16/22  1130      History   Chief Complaint Chief Complaint  Patient presents with   Otalgia   Sore Throat   Nasal Congestion   Headache    HPI Joan Dawson is a 32 y.o. female.    Otalgia Associated symptoms: headaches   Sore Throat Associated symptoms include headaches.  Headache Associated symptoms: ear pain    Here for increased congestion and hoarseness and sinus pressure.  For the last 3 to 4 weeks she had ear pressure and pain.  She also has been having a lot of of sinus pressure and nasal congestion.  She was seen in our [place] location on January 14.  At that time ear exam did not show any acute otitis media, and she was prescribed 3 days of prednisone and Flonase and Sudafed for eustachian tube dysfunction.  She relates that then by January 18 she did feel a little more open and less congested.  Then the AM of January 19 she began having more congestion again and drainage and cough.  No fever or chills.  This morning she awoke and was hoarse and could not talk.  That is improved a little bit at present.  She has not had any nausea or vomiting or diarrhea.  Past Medical History:  Diagnosis Date   ADHD    Hypertension    Migraines    Scoliosis     Patient Active Problem List   Diagnosis Date Noted   Encounter for supervision of high risk pregnancy in third trimester, antepartum 05/21/2021   Oligohydramnios 05/21/2021   Postpartum hemorrhage 05/21/2021   LGA (large for gestational age) fetus affecting management of mother 03/02/2021   Chronic hypertension with superimposed preeclampsia 12/01/2020   Obesity in pregnancy 11/03/2020   Supervision of other normal pregnancy, antepartum 10/27/2020   Right knee pain 12/02/2019   History of migraine 02/18/2018    Past Surgical History:  Procedure Laterality Date   NO PAST SURGERIES      OB History     Gravida  2   Para  2   Term  2    Preterm      AB      Living  2      SAB      IAB      Ectopic      Multiple  0   Live Births  2            Home Medications    Prior to Admission medications   Medication Sig Start Date End Date Taking? Authorizing Provider  amoxicillin-clavulanate (AUGMENTIN) 875-125 MG tablet Take 1 tablet by mouth 2 (two) times daily for 7 days. 05/16/22 05/23/22 Yes Stevan Eberwein, Janace Aris, MD  predniSONE (DELTASONE) 20 MG tablet Take 2 tablets (40 mg total) by mouth daily with breakfast for 5 days. 05/16/22 05/21/22 Yes Zenia Resides, MD  acetaminophen (TYLENOL) 325 MG tablet Take 2 tablets (650 mg total) by mouth every 4 (four) hours as needed (for pain scale < 4). Patient not taking: Reported on 07/05/2021 05/23/21   Myna Hidalgo, DO  cetirizine (ZYRTEC ALLERGY) 10 MG tablet Take 1 tablet (10 mg total) by mouth daily. Patient not taking: Reported on 03/09/2022 08/05/21   Brock Bad, MD  fluticasone [facility]) 50 MCG/ACT nasal spray Place 1 spray into both nostrils 2 (two) times daily. 05/07/22   Particia Nearing, PA-C  medroxyPROGESTERone (  DEPO-PROVERA) 150 MG/ML injection Inject 1 mL (150 mg total) into the muscle every 3 (three) months. 07/05/21   Shelly Bombard, MD    Family History Family History  Problem Relation Age of Onset   Diabetes Mother    Uterine cancer Mother    Hypertension Mother    Crohn's disease Sister    Hypertension Sister    Hypertension Brother     Social History Social History   Tobacco Use   Smoking status: Former    Types: Cigars    Quit date: 06/2020    Years since quitting: 1.8    Passive exposure: Past   Smokeless tobacco: Never   Tobacco comments:    BLACK N MILD  Vaping Use   Vaping Use: Former  Substance Use Topics   Alcohol use: Not Currently    Comment: not since confirmed pregnancy   Drug use: No     Allergies   Patient has no known allergies.   Review of Systems Review of Systems  HENT:  Positive for ear  pain.   Neurological:  Positive for headaches.     Physical Exam Triage Vital Signs ED Triage Vitals  Enc Vitals Group     BP 05/16/22 1252 (!) 138/95     Pulse Rate 05/16/22 1252 81     Resp 05/16/22 1252 18     Temp 05/16/22 1252 97.8 F (36.6 C)     Temp Source 05/16/22 1252 Oral     SpO2 05/16/22 1252 97 %     Weight --      Height --      Head Circumference --      Peak Flow --      Pain Score 05/16/22 1255 2     Pain Loc --      Pain Edu? --      Excl. in [place]? --    No data found.  Updated Vital Signs BP (!) 138/95 (BP Location: Right Arm)   Pulse 81   Temp 97.8 F (36.6 C) (Oral)   Resp 18   LMP 05/04/2022 (Exact Date)   SpO2 97%   Visual Acuity Right Eye Distance:   Left Eye Distance:   Bilateral Distance:    Right Eye Near:   Left Eye Near:    Bilateral Near:     Physical Exam Vitals reviewed.  Constitutional:      General: She is not in acute distress.    Appearance: She is not toxic-appearing.  HENT:     Right Ear: Tympanic membrane and ear canal normal.     Left Ear: Tympanic membrane and ear canal normal.     Nose: Congestion present.     Mouth/Throat:     Mouth: Mucous membranes are moist.     Comments: There is white and clear mucus draining. Eyes:     Extraocular Movements: Extraocular movements intact.     Conjunctiva/sclera: Conjunctivae normal.     Pupils: Pupils are equal, round, and reactive to light.  Cardiovascular:     Rate and Rhythm: Normal rate and regular rhythm.     Heart sounds: No murmur heard. Pulmonary:     Effort: Pulmonary effort is normal. No respiratory distress.     Breath sounds: No stridor. No wheezing, rhonchi or rales.  Musculoskeletal:     Cervical back: Neck supple.  Lymphadenopathy:     Cervical: No cervical adenopathy.  Skin:    Capillary Refill: Capillary refill takes less than 2  seconds.     Coloration: Skin is not jaundiced or pale.  Neurological:     General: No focal deficit present.      Mental Status: She is alert and oriented to person, place, and time.  Psychiatric:        Behavior: Behavior normal.      UC Treatments / Results  Labs (all labs ordered are listed, but only abnormal results are displayed) Labs Reviewed  SARS CORONAVIRUS 2 (TAT 6-24 HRS)    EKG   Radiology No results found.  Procedures Procedures (including critical care time)  Medications Ordered in UC Medications - No data to display  Initial Impression / Assessment and Plan / UC Course  I have reviewed the triage vital signs and the nursing notes.  Pertinent labs & imaging results that were available during my care of the patient were reviewed by me and considered in my medical decision making (see chart for details).     Swab was done for COVID this is a new viral illness on top of her other symptoms, and if positive she is a candidate for Paxlovid; EGFR is greater than 60  If negative for COVID, I am printing prescriptions for an antibiotic and 5 days of steroids since I think she has an acute sinusitis with all the sinus pressure she has had. Final Clinical Impressions(s) / UC Diagnoses   Final diagnoses:  Acute maxillary sinusitis, recurrence not specified     Discharge Instructions       You have been swabbed for COVID, and the test will result in the next 24 hours. Our staff will call you if positive. If the COVID test is positive, you should quarantine for 5 days from the start of your symptoms.  On days 6-10 from the start of your illness, you should wear a mask if out in public.  Your COVID test is negative, please fill the prescriptions I provided today and take them according to the bottle instructions:  Take amoxicillin-clavulanate 875 mg--1 tab twice daily with food for 7 days Take prednisone 20 mg--2 daily for 5 days These medications are intended for possible sinus infection.       ED Prescriptions     Medication Sig Dispense Auth. Provider    amoxicillin-clavulanate (AUGMENTIN) 875-125 MG tablet Take 1 tablet by mouth 2 (two) times daily for 7 days. 14 tablet Khris Jansson, Gwenlyn Perking, MD   predniSONE (DELTASONE) 20 MG tablet Take 2 tablets (40 mg total) by mouth daily with breakfast for 5 days. 10 tablet Windy Carina Gwenlyn Perking, MD      PDMP not reviewed this encounter.   Barrett Henle, MD 05/16/22 1326    Barrett Henle, MD 05/16/22 [phone]

## 2022-05-16 NOTE — Discharge Instructions (Signed)
  You have been swabbed for COVID, and the test will result in the next 24 hours. Our staff will call you if positive. If the COVID test is positive, you should quarantine for 5 days from the start of your symptoms.  On days 6-10 from the start of your illness, you should wear a mask if out in public.  Your COVID test is negative, please fill the prescriptions I provided today and take them according to the bottle instructions:  Take amoxicillin-clavulanate 875 mg--1 tab twice daily with food for 7 days Take prednisone 20 mg--2 daily for 5 days These medications are intended for possible sinus infection.

## 2022-05-17 LAB — SARS CORONAVIRUS 2 (TAT 6-24 HRS): SARS Coronavirus 2: NEGATIVE

## 2022-06-02 ENCOUNTER — Ambulatory Visit (INDEPENDENT_AMBULATORY_CARE_PROVIDER_SITE_OTHER): Payer: Medicaid Other

## 2022-06-02 DIAGNOSIS — Z3042 Encounter for surveillance of injectable contraceptive: Secondary | ICD-10-CM

## 2022-06-02 MED ORDER — MEDROXYPROGESTERONE ACETATE 150 MG/ML IM SUSP
150.0000 mg | INTRAMUSCULAR | Status: DC
  Administered 2022-06-02 – 2022-11-17 (×2): 150 mg via INTRAMUSCULAR

## 2022-06-02 NOTE — Progress Notes (Signed)
Pt is in the office for depo injection, administered in LD and pt tolerated well  .Marland Kitchen Administrations This Visit     medroxyPROGESTERone (DEPO-PROVERA) injection 150 mg     Admin Date 06/02/2022 Action Given Dose 150 mg Route Intramuscular Administered By Hinton Lovely, RN

## 2022-08-12 ENCOUNTER — Ambulatory Visit
Admission: RE | Admit: 2022-08-12 | Discharge: 2022-08-12 | Disposition: A | Discharge status: Home / Self Care | Payer: Medicaid Other | Source: Ambulatory Visit | Attending: Family Medicine | Admitting: Family Medicine

## 2022-08-12 VITALS — BP 132/78 | HR 77 | Temp 98.1°F | Resp 20

## 2022-08-12 DIAGNOSIS — H66002 Acute suppurative otitis media without spontaneous rupture of ear drum, left ear: Secondary | ICD-10-CM | POA: Diagnosis not present

## 2022-08-12 MED ORDER — CIPROFLOXACIN-DEXAMETHASONE 0.3-0.1 % OT SUSP: 3.0000 [drp] | Freq: Two times a day (BID) | OTIC | 0 refills | Status: DC

## 2022-08-12 NOTE — ED Provider Notes (Signed)
[facility]   [id number] 08/12/22 Arrival Time: 1042  ASSESSMENT & PLAN:  1. Non-recurrent acute suppurative otitis media of left ear without spontaneous rupture of tympanic membrane    Finish amoxicillin. Ques component of OE. Add: Meds ordered this encounter  Medications   ciprofloxacin-dexamethasone (CIPRODEX) OTIC suspension    Sig: Place 3 drops into the left ear 2 (two) times daily.    Dispense:  7.5 mL    Refill:  0   OTC as needed.   Follow-up Information     [facility] at [facility].   Specialty: Urgent Care Why: If worsening or failing to improve as anticipated. Contact information: [address] [place] [address] [phone]                Reviewed expectations re: course of current medical issues. Questions answered. Outlined signs and symptoms indicating need for more acute intervention. Patient verbalized understanding. After Visit Summary given.   SUBJECTIVE: History from: patient.  Joan Dawson is a 32 y.o. female who is on amox for recent dx of L OM. Feels better but ear is very tender at entrance to [facility] on the L. Denies fever. Denies ear drainage/bleeding.  Social History   Tobacco Use  Smoking Status Former   Types: Cigars   Quit date: 06/2020   Years since quitting: 2.1   Passive exposure: Past  Smokeless Tobacco Never  Tobacco Comments   BLACK N MILD    OBJECTIVE:  Vitals:   08/12/22 1051  BP: 132/78  Pulse: 77  Resp: 20  Temp: 98.1 F (36.7 C)  TempSrc: Oral  SpO2: 94%     General appearance: alert; no distress HEENT: L TM is healing; EAC with mild/moderate inflammation Neck: supple without LAD; trachea midline Lungs: unlabored respirations, symmetrical air entry; no respiratory distress Skin: warm and dry Psychological: alert and cooperative; normal mood and affect  No Known Allergies  Past Medical History:  Diagnosis Date   ADHD    Hypertension     Migraines    Scoliosis    Family History  Problem Relation Age of Onset   Diabetes Mother    Uterine cancer Mother    Hypertension Mother    Crohn's disease Sister    Hypertension Sister    Hypertension Brother    Social History   Socioeconomic History   Marital status: Single    Spouse name: Not on file   Number of children: Not on file   Years of education: Not on file   Highest education level: Not on file  Occupational History   Not on file  Tobacco Use   Smoking status: Former    Types: Cigars    Quit date: 06/2020    Years since quitting: 2.1    Passive exposure: Past   Smokeless tobacco: Never   Tobacco comments:    BLACK N MILD  Vaping Use   Vaping Use: Former  Substance and Sexual Activity   Alcohol use: Not Currently    Comment: not since confirmed pregnancy   Drug use: No   Sexual activity: Yes    Partners: Male    Birth control/protection: Injection  Other Topics Concern   Not on file  Social History Narrative   Not on file   Social Determinants of Health   Financial Resource Strain: Low Risk  (09/05/2018)   Overall Financial Resource Strain (CARDIA)    Difficulty of Paying Living Expenses: Not hard at all  Food Insecurity: No Food Insecurity (09/05/2018)   Hunger Vital Sign    Worried About Running Out of Food in the Last Year: Never true    Ran Out of Food in the Last Year: Never true  Transportation Needs: Unknown (09/05/2018)   PRAPARE - Administrator, Civil Service (Medical): No    Lack of Transportation (Non-Medical): Not on file  Physical Activity: Not on file  Stress: No Stress Concern Present (09/05/2018)   [employer] of Occupational Health - Occupational Stress Questionnaire    Feeling of Stress : Only a little  Social Connections: Not on file  Intimate Partner Violence: Not At Risk (09/05/2018)   Humiliation, Afraid, Rape, and Kick questionnaire    Fear of Current or Ex-Partner: No    Emotionally Abused: No     Physically Abused: No    Sexually Abused: No             Mardella Layman, MD 08/12/22 [phone]

## 2022-08-12 NOTE — ED Triage Notes (Signed)
PT reports her left ear is painful x 1 week. Took ibuprofen. States she had an ear infection and took amoxicillin x 1 week.  Hearing is impaired.

## 2022-08-21 ENCOUNTER — Other Ambulatory Visit: Payer: Self-pay | Admitting: Obstetrics

## 2022-08-21 DIAGNOSIS — Z30013 Encounter for initial prescription of injectable contraceptive: Secondary | ICD-10-CM

## 2022-08-24 ENCOUNTER — Ambulatory Visit (INDEPENDENT_AMBULATORY_CARE_PROVIDER_SITE_OTHER): Payer: Medicaid Other | Admitting: *Deleted

## 2022-08-24 VITALS — BP 132/86 | HR 76 | Wt 335.0 lb

## 2022-08-24 DIAGNOSIS — Z3042 Encounter for surveillance of injectable contraceptive: Secondary | ICD-10-CM | POA: Diagnosis not present

## 2022-08-24 MED ORDER — MEDROXYPROGESTERONE ACETATE 150 MG/ML IM SUSP
150.0000 mg | Freq: Once | INTRAMUSCULAR | Status: AC
  Administered 2022-08-24: 150 mg via INTRAMUSCULAR

## 2022-08-24 NOTE — Progress Notes (Signed)
Date last pap: 03/09/22. Last Depo-Provera: 06/02/22. Side Effects if any: Weight gain. Serum HCG indicated? NA. Depo-Provera 150 mg IM given by: Montez Morita, RN in LD. Next appointment due 11/09/22-11/23/22.

## 2022-09-04 ENCOUNTER — Ambulatory Visit
Admission: EM | Admit: 2022-09-04 | Discharge: 2022-09-04 | Disposition: A | Discharge status: Home / Self Care | Payer: Medicaid Other | Attending: Nurse Practitioner | Admitting: Nurse Practitioner

## 2022-09-04 DIAGNOSIS — H60501 Unspecified acute noninfective otitis externa, right ear: Secondary | ICD-10-CM | POA: Diagnosis not present

## 2022-09-04 MED ORDER — CIPROFLOXACIN-DEXAMETHASONE 0.3-0.1 % OT SUSP
4.0000 [drp] | Freq: Two times a day (BID) | OTIC | 0 refills | Status: AC
Start: 2022-09-04 — End: 2022-09-11

## 2022-09-04 NOTE — Discharge Instructions (Signed)
I believe you have an early ear infection in the skin around your right ear.  Please start using the ear drops to treat it.    Avoid use of Q-tips and getting water in your ears until it fully heals.  Recommend follow-up with ENT for further eval and management-contact information has been provided

## 2022-09-04 NOTE — ED Provider Notes (Addendum)
[facility]    CSN: [id number] Arrival date & time: 09/04/22  1038      History   Chief Complaint No chief complaint on file.   HPI Frank [place] is a 32 y.o. female.   Patient presents today for 1 day history of right ear pain.  She denies ear drainage, recent cough, congestion, sore throat, or fever.  She does use Q-tips on a regular basis.  Also reports she started a new job working at [employer] where there is no air conditioning.  Reports approximately 1 month ago, she was seen for left ear pain and was treated with Ciprodex eardrops and oral antibiotics for ear infection which did seem to help.  Patient does use Q-tips on a regular basis.    Past Medical History:  Diagnosis Date   ADHD    Hypertension    Migraines    Scoliosis     Patient Active Problem List   Diagnosis Date Noted   Encounter for supervision of high risk pregnancy in third trimester, antepartum 05/21/2021   Oligohydramnios 05/21/2021   Postpartum hemorrhage 05/21/2021   LGA (large for gestational age) fetus affecting management of mother 03/02/2021   Chronic hypertension with superimposed preeclampsia 12/01/2020   Obesity in pregnancy 11/03/2020   Supervision of other normal pregnancy, antepartum 10/27/2020   Right knee pain 12/02/2019   History of migraine 02/18/2018    Past Surgical History:  Procedure Laterality Date   NO PAST SURGERIES      OB History     Gravida  2   Para  2   Term  2   Preterm      AB      Living  2      SAB      IAB      Ectopic      Multiple  0   Live Births  2            Home Medications    Prior to Admission medications   Medication Sig Start Date End Date Taking? Authorizing Provider  acetaminophen (TYLENOL) 325 MG tablet Take 2 tablets (650 mg total) by mouth every 4 (four) hours as needed (for pain scale < 4). Patient not taking: Reported on 07/05/2021 05/23/21   Myna Hidalgo, DO  cetirizine (ZYRTEC ALLERGY) 10 MG  tablet Take 1 tablet (10 mg total) by mouth daily. Patient not taking: Reported on 03/09/2022 08/05/21   Brock Bad, MD  ciprofloxacin-dexamethasone [facility]) OTIC suspension Place 4 drops into the right ear 2 (two) times daily for 7 days. 09/04/22 09/11/22  Valentino Nose, NP  ibuprofen (ADVIL) 600 MG tablet Take 600-1,200 mg by mouth daily as needed. 08/10/22   [provider]  medroxyPROGESTERone Acetate 150 MG/ML SUSY ADMINISTER 1 ML INTO THE MUSC;LE EVERY 3 MONTHS. 08/21/22   Brock Bad, MD    Family History Family History  Problem Relation Age of Onset   Diabetes Mother    Uterine cancer Mother    Hypertension Mother    Crohn's disease Sister    Hypertension Sister    Hypertension Brother     Social History Social History   Tobacco Use   Smoking status: Former    Types: Cigars    Quit date: 06/2020    Years since quitting: 2.2    Passive exposure: Past   Smokeless tobacco: Never   Tobacco comments:    BLACK N MILD  Vaping Use   Vaping Use:  Former  Substance Use Topics   Alcohol use: Not Currently    Comment: not since confirmed pregnancy   Drug use: No     Allergies   Patient has no known allergies.   Review of Systems Review of Systems Per HPI  Physical Exam Triage Vital Signs ED Triage Vitals [09/04/22 1242]  Enc Vitals Group     BP      Pulse Rate 72     Resp 15     Temp 98.2 F (36.8 C)     Temp Source Oral     SpO2 98 %     Weight      Height      Head Circumference      Peak Flow      Pain Score 3     Pain Loc      Pain Edu?      Excl. in [place]?    No data found.  Updated Vital Signs BP 121/85 (BP Location: Left Arm)   Pulse 72   Temp 98.2 F (36.8 C) (Oral)   Resp 15   LMP  (LMP Unknown) Comment: pt recieves depo injetion and does not get her cycles  SpO2 98%   Breastfeeding No   Visual Acuity Right Eye Distance:   Left Eye Distance:   Bilateral Distance:    Right Eye Near:   Left Eye Near:     Bilateral Near:     Physical Exam Vitals and nursing note reviewed.  Constitutional:      Appearance: Normal appearance.  HENT:     Right Ear: Swelling and tenderness present. Tympanic membrane is not injected, scarred, perforated or erythematous.     Left Ear: Tympanic membrane, ear canal and external ear normal.     Ears:     Comments: Mild erythema and swelling of the external auditory canal at the entrance to the [facility]; there is tenderness with visualization of the right TM    Nose: Nose normal. No congestion or rhinorrhea.     Mouth/Throat:     Mouth: Mucous membranes are moist.     Pharynx: Oropharynx is clear.  Pulmonary:     Effort: Pulmonary effort is normal. No respiratory distress.  Musculoskeletal:     Cervical back: Normal range of motion.  Lymphadenopathy:     Cervical: No cervical adenopathy.  Skin:    General: Skin is warm and dry.     Capillary Refill: Capillary refill takes less than 2 seconds.     Coloration: Skin is not jaundiced or pale.     Findings: No erythema.  Neurological:     Mental Status: She is alert and oriented to person, place, and time.  Psychiatric:        Behavior: Behavior is cooperative.      UC Treatments / Results  Labs (all labs ordered are listed, but only abnormal results are displayed) Labs Reviewed - No data to display  EKG   Radiology No results found.  Procedures Procedures (including critical care time)  Medications Ordered in UC Medications - No data to display  Initial Impression / Assessment and Plan / UC Course  I have reviewed the triage vital signs and the nursing notes.  Pertinent labs & imaging results that were available during my care of the patient were reviewed by me and considered in my medical decision making (see chart for details).   Patient is well-appearing, normotensive, afebrile, not tachycardic, not tachypneic, oxygenating well on room air.  1. Acute otitis externa of right ear,  unspecified type Will treat with Ciprodex twice daily for 7 days Recommended discontinuing Q-tip use, ear precautions discussed Recommended follow-up with ENT and contact information given  The patient was given the opportunity to ask questions.  All questions answered to their satisfaction.  The patient is in agreement to this plan.    Final Clinical Impressions(s) / UC Diagnoses   Final diagnoses:  Acute otitis externa of right ear, unspecified type     Discharge Instructions      I believe you have an early ear infection in the skin around your right ear.  Please start using the ear drops to treat it.    Avoid use of Q-tips and getting water in your ears until it fully heals.  Recommend follow-up with ENT for further eval and management-contact information has been provided    ED Prescriptions     Medication Sig Dispense Auth. Provider   ciprofloxacin-dexamethasone (CIPRODEX) OTIC suspension Place 4 drops into the right ear 2 (two) times daily for 7 days. 7.5 mL Valentino Nose, NP      PDMP not reviewed this encounter.   Valentino Nose, NP 09/04/22 1306    Valentino Nose, NP 09/04/22 1308

## 2022-09-04 NOTE — ED Triage Notes (Signed)
Pt c/o ear pain pt states it has been going on for a month and was seen here and treat with for left ear pain ear drops and antibiotics, pt states it cleared up and now her right ear is starting to bother her with the same sx's

## 2022-11-17 ENCOUNTER — Ambulatory Visit: Payer: Medicaid Other

## 2022-11-17 VITALS — Wt 318.1 lb

## 2022-11-17 DIAGNOSIS — Z3042 Encounter for surveillance of injectable contraceptive: Secondary | ICD-10-CM

## 2022-11-17 NOTE — Progress Notes (Signed)
Pt is in the office for depo injection. Administered in [place] and pt tolerated well. Next due Oct 11-25. .. Administrations This Visit     medroxyPROGESTERone (DEPO-PROVERA) injection 150 mg     Admin Date 11/17/2022 Action Given Dose 150 mg Route Intramuscular Documented By Katrina Stack, RN

## 2022-12-15 ENCOUNTER — Ambulatory Visit: Payer: Medicaid Other | Admitting: Family Medicine

## 2022-12-22 ENCOUNTER — Encounter: Payer: Self-pay | Admitting: Family Medicine

## 2022-12-22 ENCOUNTER — Ambulatory Visit (INDEPENDENT_AMBULATORY_CARE_PROVIDER_SITE_OTHER): Payer: Medicaid Other | Admitting: Family Medicine

## 2022-12-22 VITALS — BP 130/85 | HR 82 | Ht 69.0 in | Wt 315.0 lb

## 2022-12-22 DIAGNOSIS — F419 Anxiety disorder, unspecified: Secondary | ICD-10-CM

## 2022-12-22 DIAGNOSIS — Z1329 Encounter for screening for other suspected endocrine disorder: Secondary | ICD-10-CM

## 2022-12-22 DIAGNOSIS — E559 Vitamin D deficiency, unspecified: Secondary | ICD-10-CM | POA: Diagnosis not present

## 2022-12-22 DIAGNOSIS — M25511 Pain in right shoulder: Secondary | ICD-10-CM

## 2022-12-22 DIAGNOSIS — E538 Deficiency of other specified B group vitamins: Secondary | ICD-10-CM

## 2022-12-22 DIAGNOSIS — D509 Iron deficiency anemia, unspecified: Secondary | ICD-10-CM

## 2022-12-22 DIAGNOSIS — Z13220 Encounter for screening for lipoid disorders: Secondary | ICD-10-CM

## 2022-12-22 DIAGNOSIS — Z131 Encounter for screening for diabetes mellitus: Secondary | ICD-10-CM

## 2022-12-22 MED ORDER — ESCITALOPRAM OXALATE 10 MG PO TABS: 10.0000 mg | ORAL_TABLET | Freq: Every day | ORAL | 3 refills | Status: AC

## 2022-12-22 MED ORDER — CYCLOBENZAPRINE HCL 5 MG PO TABS: 5.0000 mg | ORAL_TABLET | Freq: Three times a day (TID) | ORAL | 3 refills | Status: AC | PRN

## 2022-12-22 NOTE — Progress Notes (Unsigned)
   New Patient Office Visit   Subjective   Patient ID: [name], female    DOB: [birth year]  Age: 32 y.o. MRN: [id number]  CC:  Chief Complaint  Patient presents with   Care Management    Estabish care, would like to discuss anxiety concerns.    Shoulder Pain    Pt reports knot on right shoulder blade has concerns, burning feeling.     HPI Joan Dawson 32 year female, presents to establish care. She  has a past medical history of ADHD, Hypertension, Migraines, and Scoliosis.  Right shoulder   2 months ago  Shoulder Pain  The pain is present in the right shoulder. This is a recurrent problem. There has been no history of extremity trauma. The problem occurs intermittently. The quality of the pain is described as burning. The pain is at a severity of 7/10. Associated symptoms include numbness, stiffness and tingling. Pertinent negatives include no fever, inability to bear weight, joint locking, joint swelling or limited range of motion. The symptoms are aggravated by activity. She has tried NSAIDS for the symptoms. The treatment provided mild relief.  Anxiety Presents for initial visit. The problem has been gradually worsening. Symptoms include chest pain, excessive worry, irritability, nervous/anxious behavior and restlessness. Symptoms occur most days. The severity of symptoms is mild. The symptoms are aggravated by work stress and family issues. The patient sleeps 6 hours per night. The quality of sleep is fair. Nighttime awakenings: one to two, occasional.   Risk factors include prior traumatic experience. There is no history of anxiety/panic attacks. Past treatments include nothing.      Outpatient Encounter Medications as of 12/22/2022  Medication Sig   ibuprofen (ADVIL) 600 MG tablet Take 600-1,200 mg by mouth daily as needed.   medroxyPROGESTERone Acetate 150 MG/ML SUSY ADMINISTER 1 ML INTO THE MUSC;LE EVERY 3 MONTHS.   cetirizine (ZYRTEC ALLERGY) 10 MG tablet Take 1  tablet (10 mg total) by mouth daily. (Patient not taking: Reported on 12/22/2022)   [DISCONTINUED] acetaminophen (TYLENOL) 325 MG tablet Take 2 tablets (650 mg total) by mouth every 4 (four) hours as needed (for pain scale < 4). (Patient not taking: Reported on 07/05/2021)   [DISCONTINUED] medroxyPROGESTERone (DEPO-PROVERA) injection 150 mg    No facility-administered encounter medications on file as of 12/22/2022.    Past Surgical History:  Procedure Laterality Date   NO PAST SURGERIES      Review of Systems  Constitutional:  Positive for irritability. Negative for chills and fever.  Eyes:  Negative for blurred vision.  Cardiovascular:  Positive for chest pain.  Gastrointestinal:  Negative for abdominal pain.  Genitourinary:  Negative for dysuria.  Musculoskeletal:  Positive for stiffness.  Skin:  Negative for rash.  Neurological:  Positive for tingling and numbness.  Psychiatric/Behavioral:  The patient is nervous/anxious.       Objective    BP 130/85   Pulse 82   Ht 5\' 9"  (1.753 m)   Wt (!) 315 lb (142.9 kg)   SpO2 98%   BMI 46.52 kg/m   Physical Exam    Assessment & Plan:  There are no diagnoses linked to this encounter.  No follow-ups on file.   Cruzita Lederer Newman Nip, FNP

## 2022-12-22 NOTE — Patient Instructions (Signed)

## 2022-12-23 DIAGNOSIS — F419 Anxiety disorder, unspecified: Secondary | ICD-10-CM | POA: Insufficient documentation

## 2022-12-23 DIAGNOSIS — M25511 Pain in right shoulder: Secondary | ICD-10-CM | POA: Insufficient documentation

## 2022-12-23 NOTE — Assessment & Plan Note (Signed)
Trial on Lexapro 10 mg daily Follow up in 6 weeks Continued discussion on lifestyle changes, establishing a daily routine, going outdoors, exercise, healthy eating habits, mindfulness and mediatation.

## 2022-12-23 NOTE — Assessment & Plan Note (Signed)
Xray ordered awaiting results will follow up Flexeril 5 mg PRN Explained to patient Non pharmacological interventions include the use of ice or heat, rest, recommend range of motion exercises, gentle stretching. The use of NSAIDs for pain management.  Follow up for worsening or persistent symptoms. Patient verbalizes understanding regarding plan of care and all questions answered.

## 2023-01-01 ENCOUNTER — Ambulatory Visit (HOSPITAL_COMMUNITY)
Admission: RE | Admit: 2023-01-01 | Discharge: 2023-01-01 | Disposition: A | Discharge status: Home / Self Care | Payer: Medicaid Other | Source: Ambulatory Visit | Attending: Family Medicine | Admitting: Family Medicine

## 2023-01-01 ENCOUNTER — Other Ambulatory Visit: Payer: Self-pay

## 2023-01-01 DIAGNOSIS — O119 Pre-existing hypertension with pre-eclampsia, unspecified trimester: Secondary | ICD-10-CM

## 2023-01-01 DIAGNOSIS — M25511 Pain in right shoulder: Secondary | ICD-10-CM | POA: Diagnosis present

## 2023-01-02 LAB — CBC WITH DIFFERENTIAL/PLATELET
Basophils Absolute: 0 10*3/uL (ref 0.0–0.2)
Basos: 1 %
EOS (ABSOLUTE): 0.1 10*3/uL (ref 0.0–0.4)
Eos: 1 %
Hematocrit: 44.6 % (ref 34.0–46.6)
Hemoglobin: 14.7 g/dL (ref 11.1–15.9)
Immature Grans (Abs): 0 10*3/uL (ref 0.0–0.1)
Immature Granulocytes: 0 %
Lymphocytes Absolute: 2.6 10*3/uL (ref 0.7–3.1)
Lymphs: 37 %
MCH: 30.4 pg (ref 26.6–33.0)
MCHC: 33 g/dL (ref 31.5–35.7)
MCV: 92 fL (ref 79–97)
Monocytes Absolute: 0.4 10*3/uL (ref 0.1–0.9)
Monocytes: 6 %
Neutrophils Absolute: 3.9 10*3/uL (ref 1.4–7.0)
Neutrophils: 55 %
Platelets: 271 10*3/uL (ref 150–450)
RBC: 4.83 x10E6/uL (ref 3.77–5.28)
RDW: 12 % (ref 11.7–15.4)
WBC: 7.1 10*3/uL (ref 3.4–10.8)

## 2023-01-02 LAB — CMP14+EGFR
ALT: 23 IU/L (ref 0–32)
AST: 15 IU/L (ref 0–40)
Albumin: 4.4 g/dL (ref 4.0–5.0)
Alkaline Phosphatase: 64 IU/L (ref 44–121)
BUN/Creatinine Ratio: 14 (ref 9–23)
BUN: 13 mg/dL (ref 6–20)
Bilirubin Total: 0.6 mg/dL (ref 0.0–1.2)
CO2: 20 mmol/L (ref 20–29)
Calcium: 9.7 mg/dL (ref 8.7–10.2)
Chloride: 105 mmol/L (ref 96–106)
Creatinine, Ser: 0.91 mg/dL (ref 0.57–1.00)
Globulin, Total: 3 g/dL (ref 1.5–4.5)
Glucose: 90 mg/dL (ref 70–99)
Potassium: 4.6 mmol/L (ref 3.5–5.2)
Sodium: 141 mmol/L (ref 134–144)
Total Protein: 7.4 g/dL (ref 6.0–8.5)
eGFR: 87 mL/min/{1.73_m2} (ref >59)

## 2023-01-02 LAB — IRON,TIBC AND FERRITIN PANEL
Ferritin: 38 ng/mL (ref 15–150)
Iron Saturation: 25 % (ref 15–55)
Iron: 95 ug/dL (ref 27–159)
Total Iron Binding Capacity: 376 ug/dL (ref 250–450)
UIBC: 281 ug/dL (ref 131–425)

## 2023-01-02 LAB — LIPID PANEL
Chol/HDL Ratio: 3.7 ratio (ref 0.0–4.4)
Cholesterol, Total: 141 mg/dL (ref 100–199)
HDL: 38 mg/dL — ABNORMAL LOW (ref >39)
LDL Chol Calc (NIH): 86 mg/dL (ref 0–99)
Triglycerides: 86 mg/dL (ref 0–149)
VLDL Cholesterol Cal: 17 mg/dL (ref 5–40)

## 2023-01-02 LAB — HEMOGLOBIN A1C
Est. average glucose Bld gHb Est-mCnc: 103 mg/dL
Hgb A1c MFr Bld: 5.2 % (ref 4.8–5.6)

## 2023-01-02 LAB — MICROALBUMIN / CREATININE URINE RATIO
Creatinine, Urine: 102.9 mg/dL
Microalb/Creat Ratio: 8 mg/g{creat} (ref 0–29)
Microalbumin, Urine: 8.6 ug/mL

## 2023-01-02 LAB — TSH+FREE T4
Free T4: 0.97 ng/dL (ref 0.82–1.77)
TSH: 2.82 u[IU]/mL (ref 0.450–4.500)

## 2023-01-02 LAB — VITAMIN D 25 HYDROXY (VIT D DEFICIENCY, FRACTURES): Vit D, 25-Hydroxy: 33.6 ng/mL (ref 30.0–100.0)

## 2023-01-02 LAB — VITAMIN B12: Vitamin B-12: 288 pg/mL (ref 232–1245)

## 2023-02-02 ENCOUNTER — Ambulatory Visit: Payer: Medicaid Other

## 2023-02-05 ENCOUNTER — Ambulatory Visit: Payer: Medicaid Other | Admitting: Family Medicine

## 2023-02-08 NOTE — Progress Notes (Signed)
Patient Office Visit   Subjective   Patient ID: Joan Dawson, female    DOB: 1991/03/04  Age: 32 y.o. MRN: [id number]  CC:  Chief Complaint  Patient presents with   Anxiety    F/u for anxiety meds. Wt. Loss.     HPI Joan Dawson 32 year old female, presents to the clinic for anxiety follow up.She  has a past medical history of ADHD, Hypertension, Migraines, and Scoliosis.For the details of today's visit, please refer to assessment and plan.   HPI    Outpatient Encounter Medications as of 02/09/2023  Medication Sig   cyclobenzaprine (FLEXERIL) 5 MG tablet Take 1 tablet (5 mg total) by mouth 3 (three) times daily as needed for muscle spasms.   escitalopram (LEXAPRO) 10 MG tablet Take 1 tablet (10 mg total) by mouth daily.   medroxyPROGESTERone Acetate 150 MG/ML SUSY ADMINISTER 1 ML INTO THE MUSC;LE EVERY 3 MONTHS.   Multiple Vitamin (MULTIVITAMIN) tablet Take 1 tablet by mouth daily.   cetirizine (ZYRTEC ALLERGY) 10 MG tablet Take 1 tablet (10 mg total) by mouth daily. (Patient not taking: Reported on 02/09/2023)   Facility-Administered Encounter Medications as of 02/09/2023  Medication   medroxyPROGESTERone (DEPO-PROVERA) injection 150 mg    Past Surgical History:  Procedure Laterality Date   NO PAST SURGERIES      Review of Systems  Constitutional:  Negative for chills and fever.  Respiratory:  Negative for shortness of breath.   Cardiovascular:  Negative for chest pain.  Gastrointestinal:  Negative for abdominal pain.  Psychiatric/Behavioral:  The patient is not nervous/anxious.       Objective    BP (!) 136/94   Pulse 78   Ht 5\' 9"  (1.753 m)   Wt (!) 302 lb 1.9 oz (137 kg)   LMP 02/06/2023 (Approximate)   SpO2 98%   Breastfeeding No   BMI 44.62 kg/m   Physical Exam Vitals reviewed.  Constitutional:      General: She is not in acute distress.    Appearance: Normal appearance. She is not ill-appearing, toxic-appearing or diaphoretic.  HENT:      Head: Normocephalic.  Eyes:     General:        Right eye: No discharge.        Left eye: No discharge.     Conjunctiva/sclera: Conjunctivae normal.  Cardiovascular:     Rate and Rhythm: Normal rate.     Pulses: Normal pulses.     Heart sounds: Normal heart sounds.  Pulmonary:     Effort: Pulmonary effort is normal. No respiratory distress.     Breath sounds: Normal breath sounds.  Abdominal:     General: Bowel sounds are normal.     Palpations: Abdomen is soft.     Tenderness: There is no abdominal tenderness. There is no guarding.  Musculoskeletal:        General: Normal range of motion.     Cervical back: Normal range of motion.  Skin:    General: Skin is warm and dry.     Capillary Refill: Capillary refill takes less than 2 seconds.  Neurological:     Mental Status: She is alert and oriented to person, place, and time.     Coordination: Coordination normal.     Gait: Gait normal.  Psychiatric:        Mood and Affect: Mood normal.       Assessment & Plan:  Anxiety Assessment & Plan:    12/22/2022  3:54 PM 12/22/2022    3:48 PM 03/09/2022   10:52 AM 07/05/2021   10:46 AM  GAD 7 : Generalized Anxiety Score  Nervous, Anxious, on Edge 0 0 0 0  Control/stop worrying 0 0 0 0  Worry too much - different things 1 0 0 0  Trouble relaxing 0 0 0 0  Restless 0 0 0 0  Easily annoyed or irritable 1 0 0 0  Afraid - awful might happen 0 0 0 0  Total GAD 7 Score 2 0 0 0  Anxiety Difficulty Not difficult at all Not difficult at all     Controlled on Lexapro 10 mg once daily We discussed several non-pharmacological approaches to managing depression, including:  Establishing a consistent daily routine: This helps create structure and stability. Practicing mindfulness and relaxation techniques: Incorporating meditation, deep breathing exercises, or yoga to manage stress and improve emotional well-being. Engaging in regular physical activity: Aim for at least 30 minutes of  exercise most days to boost mood and energy levels. Spending time outdoors: Exposure to natural light and fresh air can improve mental health. Building a support network: Encouraging social connections with friends, family, or support groups to reduce feelings of isolation. Prioritizing a balanced diet: Eating nutrient-rich foods while avoiding excessive amounts of processed foods, sugar, and unhealthy fats. Follow-up is recommended in 4-8 weeks to assess progress, with a referral to behavioral health for further support if needed.      Return in about 6 months (around 08/10/2023), or if symptoms worsen or fail to improve, for routine labs, Anxiety, medication managment.   Cruzita Lederer Newman Nip, FNP

## 2023-02-08 NOTE — Patient Instructions (Signed)

## 2023-02-09 ENCOUNTER — Encounter: Payer: Self-pay | Admitting: Family Medicine

## 2023-02-09 ENCOUNTER — Ambulatory Visit (INDEPENDENT_AMBULATORY_CARE_PROVIDER_SITE_OTHER): Payer: Medicaid Other

## 2023-02-09 ENCOUNTER — Ambulatory Visit: Payer: Medicaid Other | Admitting: Family Medicine

## 2023-02-09 VITALS — BP 136/94 | HR 78 | Ht 69.0 in | Wt 302.1 lb

## 2023-02-09 VITALS — Wt 299.7 lb

## 2023-02-09 DIAGNOSIS — F419 Anxiety disorder, unspecified: Secondary | ICD-10-CM | POA: Diagnosis not present

## 2023-02-09 DIAGNOSIS — Z3042 Encounter for surveillance of injectable contraceptive: Secondary | ICD-10-CM | POA: Diagnosis not present

## 2023-02-09 MED ORDER — MEDROXYPROGESTERONE ACETATE 150 MG/ML IM SUSP
150.0000 mg | INTRAMUSCULAR | Status: AC
Start: 2023-02-09 — End: ?
  Administered 2023-02-09 – 2024-03-25 (×3): 150 mg via INTRAMUSCULAR

## 2023-02-09 NOTE — Assessment & Plan Note (Signed)
    12/22/2022    3:54 PM 12/22/2022    3:48 PM 03/09/2022   10:52 AM 07/05/2021   10:46 AM  GAD 7 : Generalized Anxiety Score  Nervous, Anxious, on Edge 0 0 0 0  Control/stop worrying 0 0 0 0  Worry too much - different things 1 0 0 0  Trouble relaxing 0 0 0 0  Restless 0 0 0 0  Easily annoyed or irritable 1 0 0 0  Afraid - awful might happen 0 0 0 0  Total GAD 7 Score 2 0 0 0  Anxiety Difficulty Not difficult at all Not difficult at all     Controlled on Lexapro 10 mg once daily We discussed several non-pharmacological approaches to managing depression, including:  Establishing a consistent daily routine: This helps create structure and stability. Practicing mindfulness and relaxation techniques: Incorporating meditation, deep breathing exercises, or yoga to manage stress and improve emotional well-being. Engaging in regular physical activity: Aim for at least 30 minutes of exercise most days to boost mood and energy levels. Spending time outdoors: Exposure to natural light and fresh air can improve mental health. Building a support network: Encouraging social connections with friends, family, or support groups to reduce feelings of isolation. Prioritizing a balanced diet: Eating nutrient-rich foods while avoiding excessive amounts of processed foods, sugar, and unhealthy fats. Follow-up is recommended in 4-8 weeks to assess progress, with a referral to behavioral health for further support if needed.

## 2023-02-09 NOTE — Progress Notes (Signed)
Pt is in the office for depo injection. Administered in L Del, per pt request, and pt tolerated well. Next due Jan 3- 17.  .. Administrations This Visit     medroxyPROGESTERone (DEPO-PROVERA) injection 150 mg     Admin Date 02/09/2023 Action Given Dose 150 mg Route Intramuscular Documented By Katrina Stack, RN

## 2023-05-04 ENCOUNTER — Ambulatory Visit: Payer: Medicaid Other | Admitting: Emergency Medicine

## 2023-05-04 ENCOUNTER — Encounter: Payer: Self-pay | Admitting: Obstetrics and Gynecology

## 2023-05-04 VITALS — BP 129/81 | HR 75 | Wt 295.0 lb

## 2023-05-04 DIAGNOSIS — Z3042 Encounter for surveillance of injectable contraceptive: Secondary | ICD-10-CM | POA: Diagnosis not present

## 2023-05-04 MED ORDER — MEDROXYPROGESTERONE ACETATE 150 MG/ML IM SUSP
150.0000 mg | Freq: Once | INTRAMUSCULAR | Status: AC
  Administered 2023-05-04: 150 mg via INTRAMUSCULAR

## 2023-05-04 NOTE — Progress Notes (Signed)
 Date last pap: 03/09/2022. Last Depo-Provera: 02/09/2023. Side Effects if any: None. Serum HCG indicated? NA. Depo-Provera 150 mg IM given by: Resa Miner, RN into LD. Next appointment due Mar 28-Apr 11.

## 2023-07-18 NOTE — Progress Notes (Unsigned)
 ANNUAL EXAM Patient name: Joan Dawson MRN [id number]  Date of birth: [birth year] Chief Complaint:   No chief complaint on file.  History of Present Illness:   Joan Dawson is a 33 y.o. [id number] with No LMP recorded. Patient has had an injection. being seen today for a routine annual exam.  Current complaints: ***   The pregnancy intention screening data noted above was reviewed. Potential methods of contraception were discussed. The patient elected to proceed with No data recorded.   Last pap 03/09/22. Results were:  NILM . H/O abnormal pap: yes hx HR HPV+ (2021) Last mammogram: na/. Results were: N/A. Family h/o breast cancer: {yes***/no:23838} Last colonoscopy: n/a. Results were: N/A. Family h/o colorectal cancer: {yes***/no:23838} HPV vaccine: ***not in chart     02/09/2023    1:12 PM 12/22/2022    3:54 PM 12/22/2022    3:48 PM 03/09/2022   10:51 AM 07/05/2021   10:45 AM  Depression screen PHQ 2/9  Decreased Interest 1 1 0 0 0  Down, Depressed, Hopeless  0 0 0 0  PHQ - 2 Score 1 1 0 0 0  Altered sleeping  0 0 0 0  Tired, decreased energy 1 1 0 0 0  Change in appetite  1 0 1 0  Feeling bad or failure about yourself   1 0 0 0  Trouble concentrating  0 0 0 0  Moving slowly or fidgety/restless  0 0 0 0  Suicidal thoughts  0 0 0 0  PHQ-9 Score  4 0 1 0  Difficult doing work/chores Not difficult at all Not difficult at all Not difficult at all          12/22/2022    3:54 PM 12/22/2022    3:48 PM 03/09/2022   10:52 AM 07/05/2021   10:46 AM  GAD 7 : Generalized Anxiety Score  Nervous, Anxious, on Edge 0 0 0 0  Control/stop worrying 0 0 0 0  Worry too much - different things 1 0 0 0  Trouble relaxing 0 0 0 0  Restless 0 0 0 0  Easily annoyed or irritable 1 0 0 0  Afraid - awful might happen 0 0 0 0  Total GAD 7 Score 2 0 0 0  Anxiety Difficulty Not difficult at all Not difficult at all       Review of Systems:   Pertinent items are noted in HPI Denies any  headaches, blurred vision, fatigue, shortness of breath, chest pain, abdominal pain, abnormal vaginal discharge/itching/odor/irritation, problems with periods, bowel movements, urination, or intercourse unless otherwise stated above. Pertinent History Reviewed:  Reviewed past medical,surgical, social and family history.  Reviewed problem list, medications and allergies. Physical Assessment:  There were no vitals filed for this visit.There is no height or weight on file to calculate BMI.        Physical Examination:   General appearance - well appearing, and in no distress  Mental status - alert, oriented to person, place, and time  Chest - respiratory effort normal  Heart - normal peripheral perfusion  Breasts - breasts appear normal, no suspicious masses, no skin or nipple changes or axillary nodes  Abdomen - soft, nontender, nondistended, no masses or organomegaly  Pelvic - VULVA: normal appearing vulva with no masses, tenderness or lesions  VAGINA: normal appearing vagina with normal color and discharge, no lesions  CERVIX: normal appearing cervix without discharge or lesions, no CMT  Thin prep pap is {Desc; done/not:10129} *** HR HPV  cotesting  UTERUS: uterus is felt to be normal size, shape, consistency and nontender   ADNEXA: No adnexal masses or tenderness noted.  Chaperone present for exam  No results found for this or any previous visit (from the past 24 hours).  Assessment & Plan:  1) Well-Woman Exam Mammogram: {Mammo f/u:25212::"@ 33yo"}, or sooner if problems Colonoscopy: {TCS f/u:25213::"@ 33yo"}, or sooner if problems Pap: Offered cotest this year vs next year with plan for 3 year follow up if HPV negative Gardasil: GC/CT: HIV/HCV:  2) ***  Labs/procedures today: ***  No orders of the defined types were placed in this encounter.   Meds: No orders of the defined types were placed in this encounter.   Follow-up: No follow-ups on file.  Lennart Pall,  MD 07/18/2023 12:12 AM

## 2023-07-20 ENCOUNTER — Encounter: Payer: Self-pay | Admitting: Obstetrics and Gynecology

## 2023-07-20 ENCOUNTER — Ambulatory Visit: Payer: Medicaid Other

## 2023-07-20 ENCOUNTER — Other Ambulatory Visit (HOSPITAL_COMMUNITY)
Admission: RE | Admit: 2023-07-20 | Discharge: 2023-07-20 | Disposition: A | Discharge status: Home / Self Care | Source: Ambulatory Visit | Attending: Obstetrics and Gynecology | Admitting: Obstetrics and Gynecology

## 2023-07-20 ENCOUNTER — Ambulatory Visit: Payer: Medicaid Other | Admitting: Obstetrics and Gynecology

## 2023-07-20 VITALS — BP 122/81 | HR 79 | Ht 69.0 in | Wt 297.0 lb

## 2023-07-20 DIAGNOSIS — Z3042 Encounter for surveillance of injectable contraceptive: Secondary | ICD-10-CM | POA: Diagnosis not present

## 2023-07-20 DIAGNOSIS — Z1339 Encounter for screening examination for other mental health and behavioral disorders: Secondary | ICD-10-CM | POA: Diagnosis not present

## 2023-07-20 DIAGNOSIS — Z01419 Encounter for gynecological examination (general) (routine) without abnormal findings: Secondary | ICD-10-CM

## 2023-07-20 MED ORDER — MEDROXYPROGESTERONE ACETATE 150 MG/ML IM SUSP
150.0000 mg | Freq: Once | INTRAMUSCULAR | Status: AC
  Administered 2023-07-20: 150 mg via INTRAMUSCULAR

## 2023-07-20 MED ORDER — MEDROXYPROGESTERONE ACETATE 150 MG/ML IM SUSP: 150.0000 mg | INTRAMUSCULAR | 4 refills | Status: AC

## 2023-07-20 NOTE — Progress Notes (Addendum)
 33 y.o. GYN presents for AEX/DEPO Injection.  Declines STD screening.  Need refills on BC.  Administrations This Visit     medroxyPROGESTERone (DEPO-PROVERA) injection 150 mg     Admin Date 07/20/2023 Action Given Dose 150 mg Route Intramuscular Documented By Maretta Bees, RMA

## 2023-07-20 NOTE — Patient Instructions (Addendum)
 It was nice meeting you today. You will see your results in the MyChart app within 1 week.

## 2023-07-23 ENCOUNTER — Encounter: Payer: Self-pay | Admitting: Obstetrics and Gynecology

## 2023-07-23 LAB — CYTOLOGY - PAP
Comment: NEGATIVE
Diagnosis: NEGATIVE
High risk HPV: NEGATIVE

## 2023-09-13 IMAGING — US US MFM FETAL BPP W/O NON-STRESS
1 series · 15 of 28 positions shown · non-contrast
Comparison: none

[Series 1: us mfm fetal bpp w/o non-stress · 37 acquisitions, 15 frames shown]
[im 1/37]
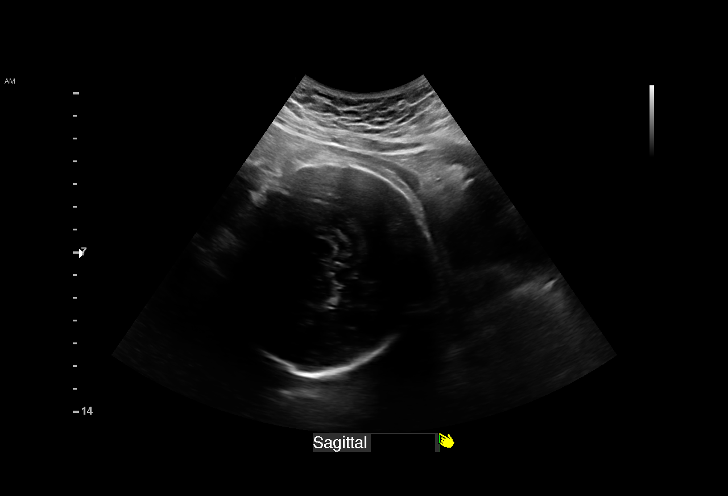
[im 3/37]
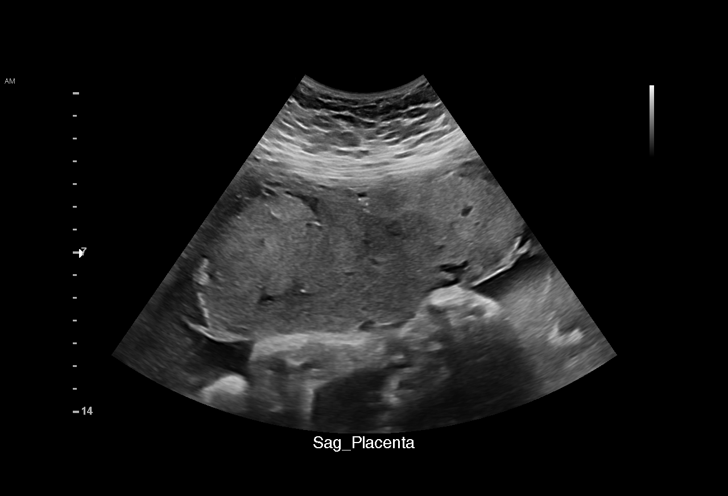
[im 6/37]
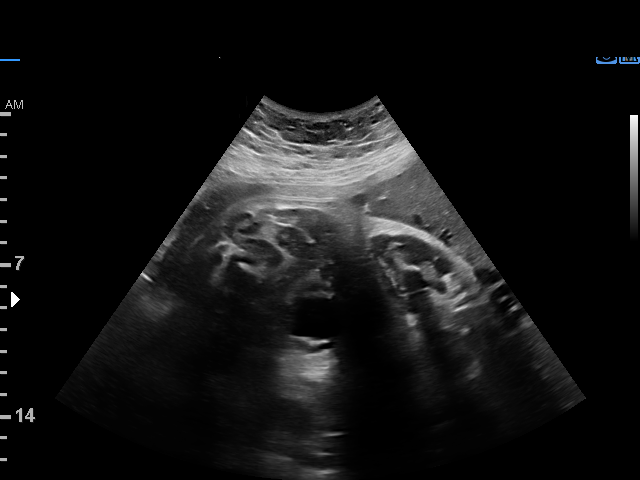
[im 9/37]
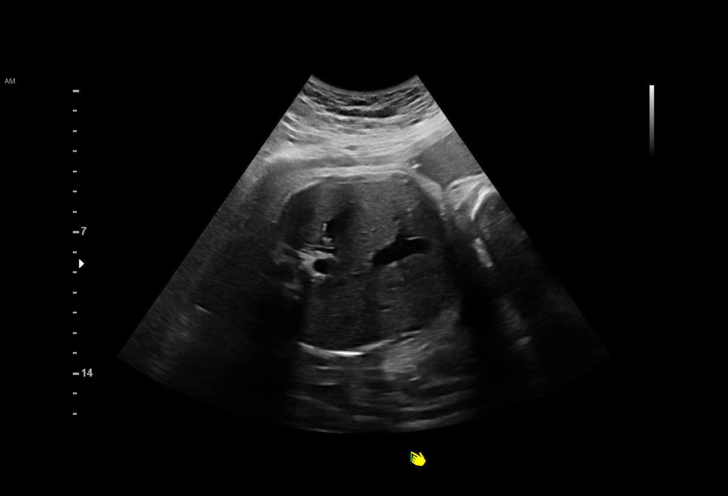
[im 11/37]
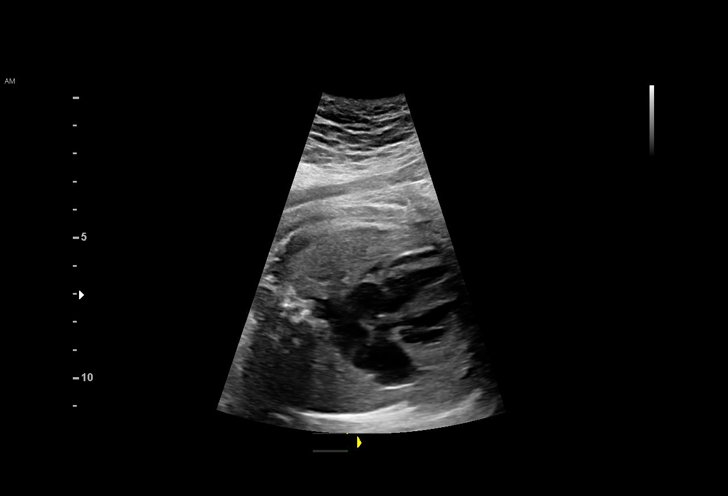
[im 14/37]
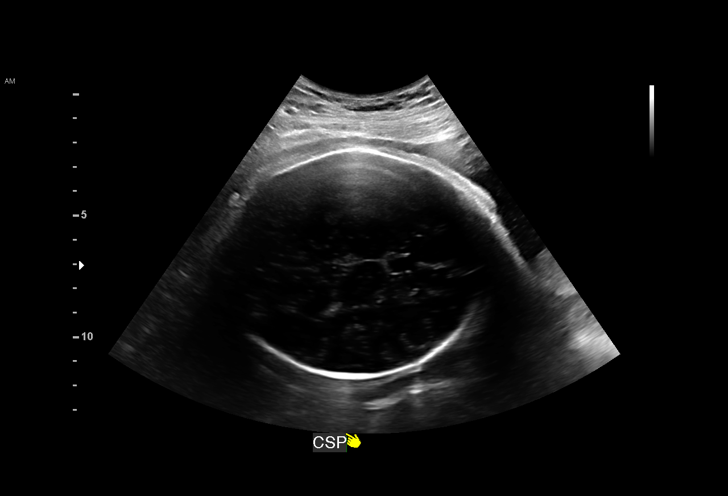
[im 17/37]
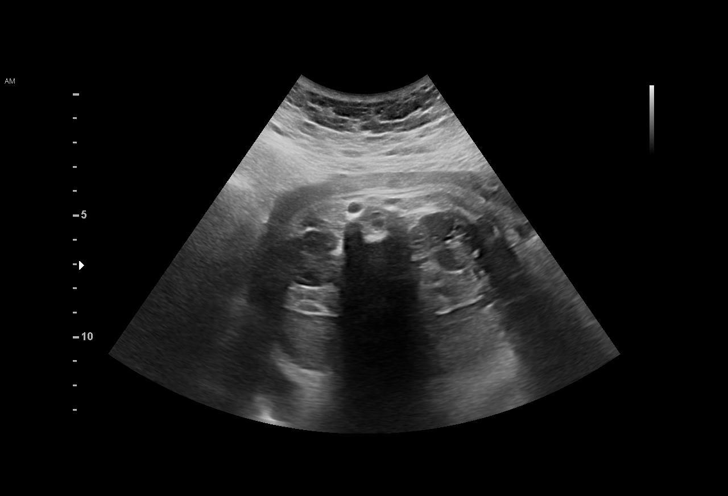
[im 19/37]
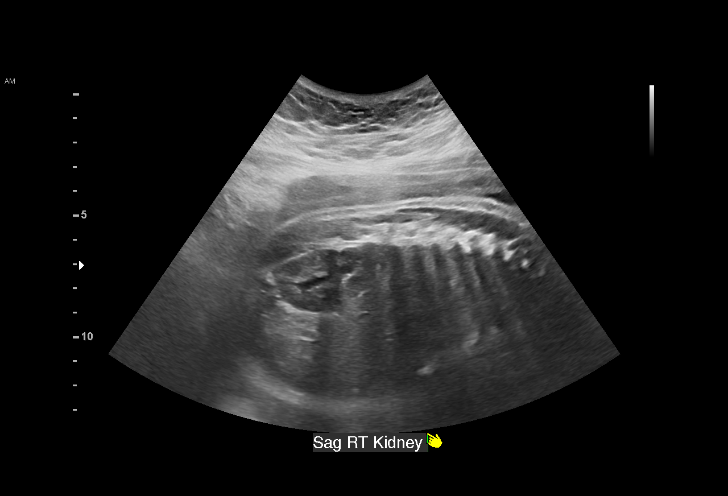
[im 21/37]
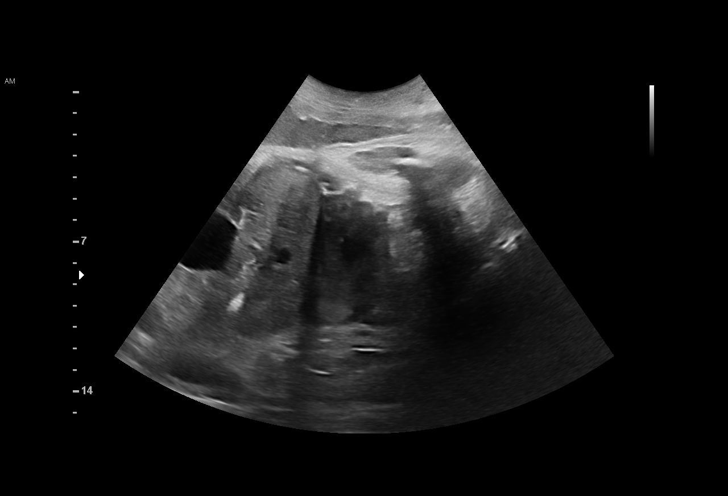
[im 23/37]
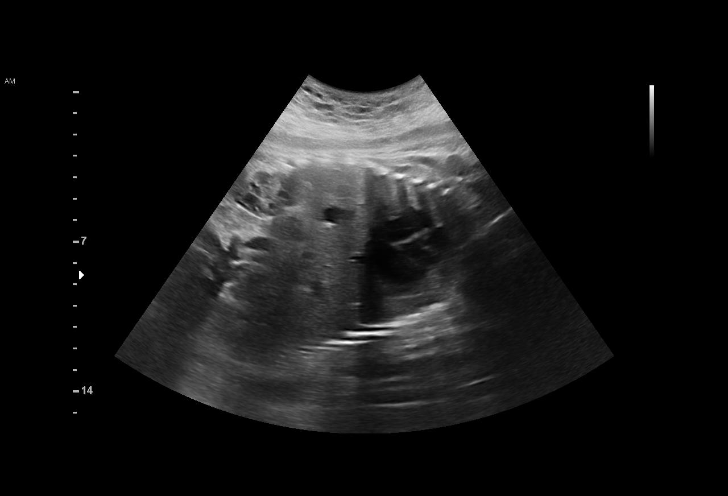
[im 26/37]
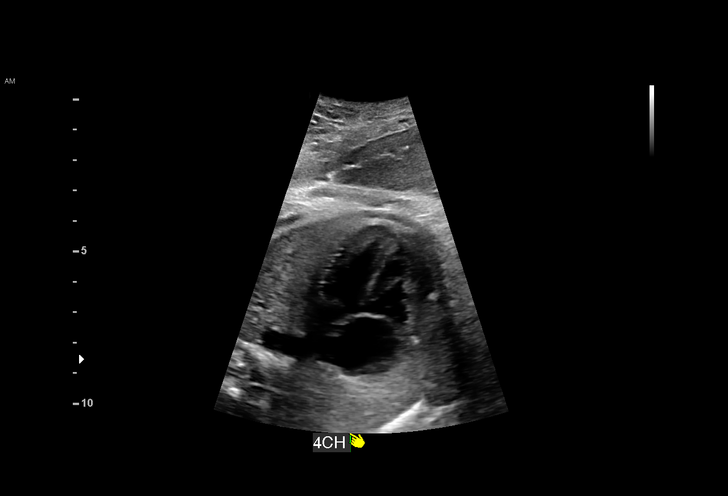
[im 29/37]
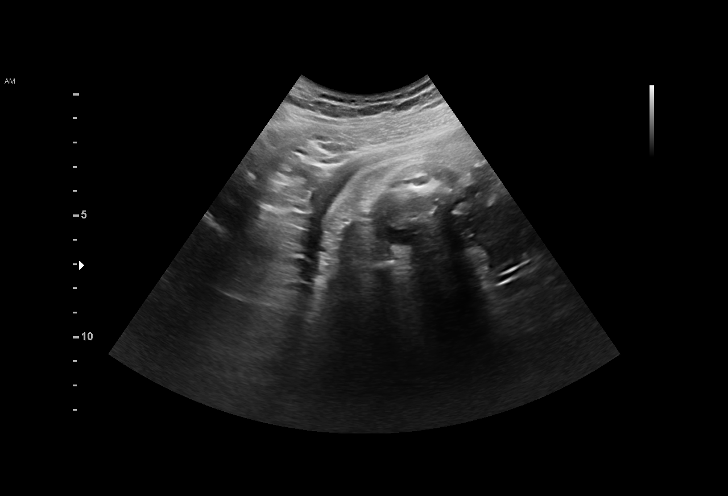
[im 31/37]
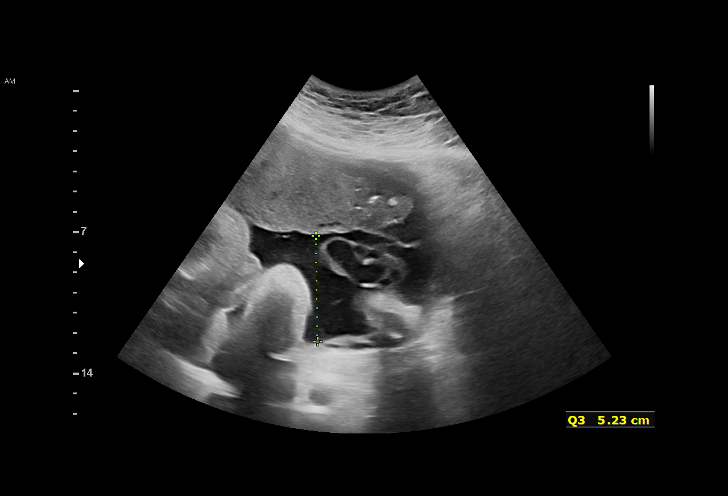
[im 34/37]
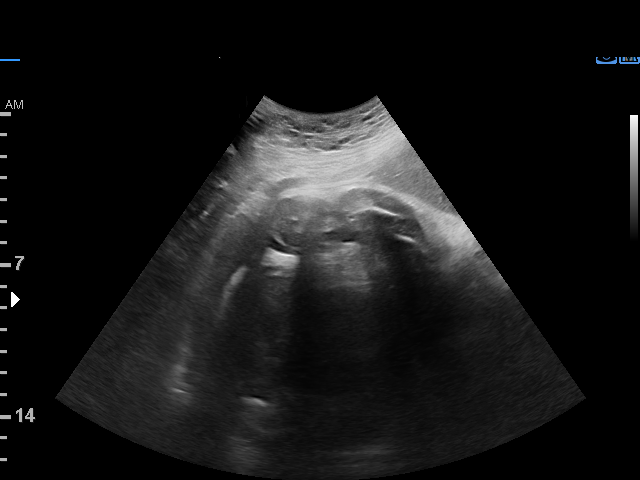
[im 37/37]
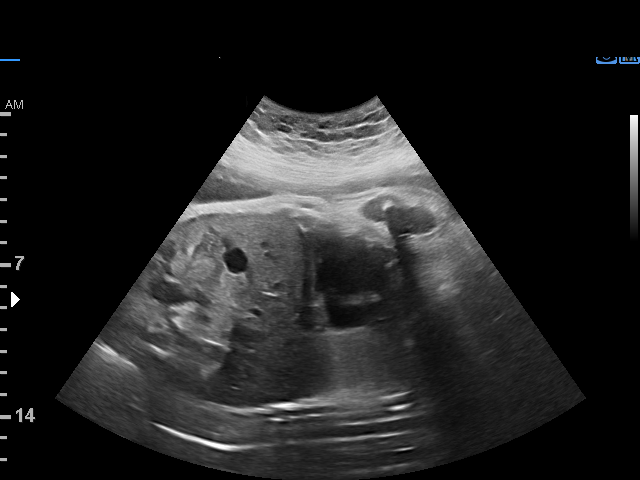

[15 of 28 positions shown; findings below may reference images not displayed]

Indications

 36 weeks gestation of pregnancy
 Obesity complicating pregnancy, third
 trimester (BMI 44)
 LR NIPS, Neg Horizon
 Encounter for other antenatal screening
 follow-up
Fetal Evaluation

 Num Of Fetuses:         1
 Fetal Heart Rate(bpm):  135
 Cardiac Activity:       Observed
 Presentation:           Cephalic
 Placenta:               Anterior
 P. Cord Insertion:      Previously Visualized

 Amniotic Fluid
 AFI FV:      Within normal limits

 AFI Sum(cm)     %Tile       Largest Pocket(cm)
 12.71           43

               RLQ(cm)       LUQ(cm)        LLQ(cm)

Biophysical Evaluation
 Amniotic F.V:   Within normal limits       F. Tone:        Observed
 F. Movement:    Observed                   Score:          [DATE]
 F. Breathing:   Observed
Biometry

 LV:        6.6  mm
OB History

 Gravidity:    2
 Living:       1
Gestational Age

 LMP:           36w 1d        Date:  08/25/20                 EDD:   06/01/21
 Best:          36w 2d     Det. By:  U/S C R L  (10/27/20)    EDD:   05/31/21
Anatomy

 Cranium:               Appears normal         Heart:                  Appears normal
                                                                       (4CH, axis, and
                                                                       situs)
 Cavum:                 Appears normal         Diaphragm:              Appears normal
 Ventricles:            Appears normal         Stomach:                Appears normal, left
                                                                       sided
 Cerebellum:            Appears normal         Kidneys:                Appear normal
 Posterior Fossa:       Appears normal         Bladder:                Appears normal

 Other:  Technically difficult due to advanced gestational age.
Cervix Uterus Adnexa

 Cervix
 Not visualized (advanced GA >84wks)

 Uterus
 No abnormality visualized.

 Right Ovary
 Not visualized.

 Left Ovary
 Not visualized.
Impression

 Antenatal testing performed given maternal elevated BMI.
 The biophysical profile was [DATE] with good fetal movement and
 amniotic fluid volume.
Recommendations

 Continue weekly testing.

## 2023-09-27 IMAGING — US US MFM FETAL BPP W/O NON-STRESS
1 series · 14 of 28 positions shown · non-contrast
Comparison: none

[Series 1: us mfm fetal bpp w/o non-stress · 54 acquisitions, 14 frames shown]
[im 2/54]
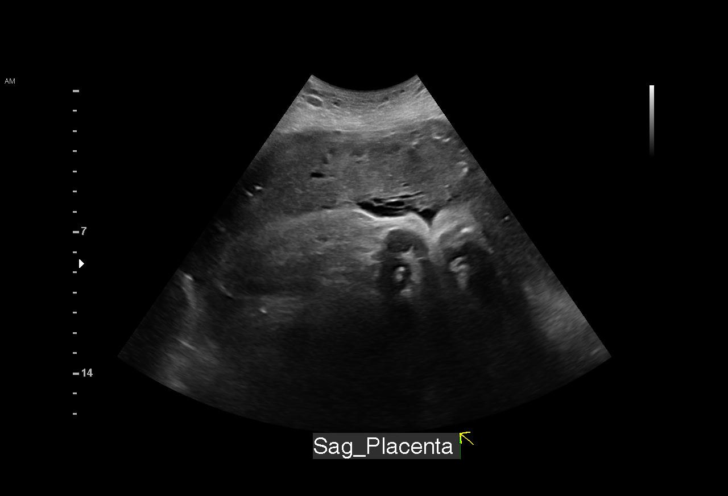
[im 6/54]
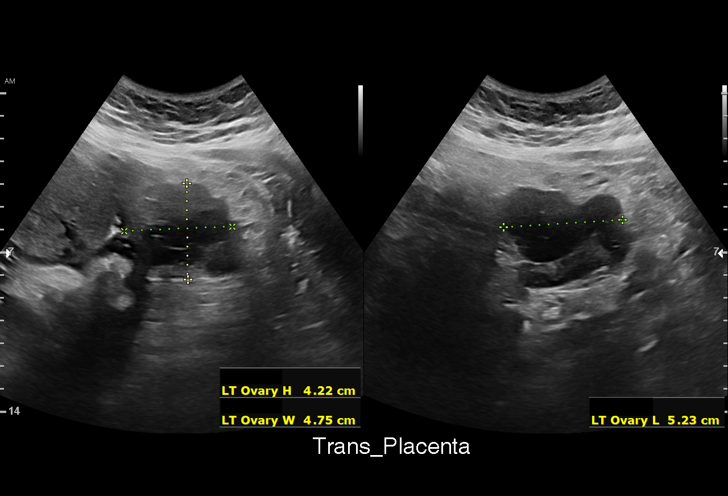
[im 10/54]
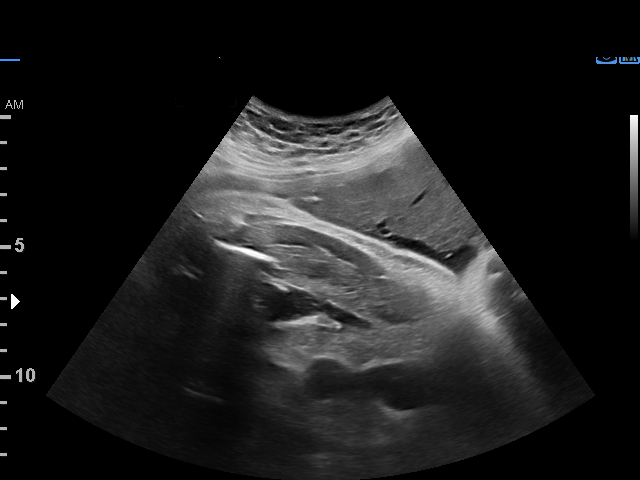
[im 14/54]
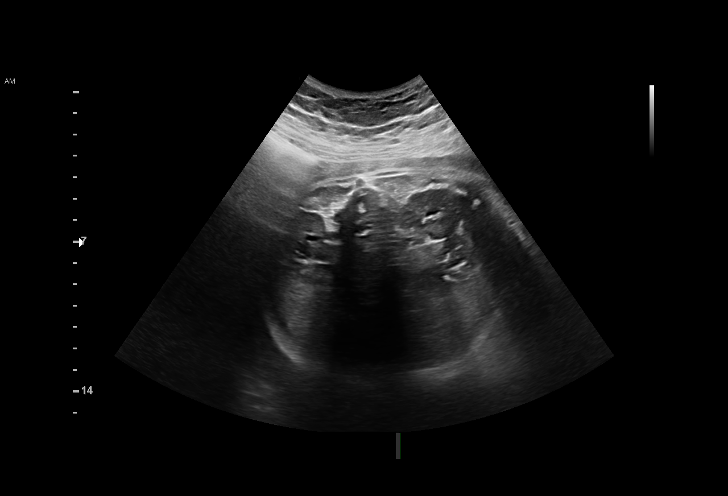
[im 18/54]
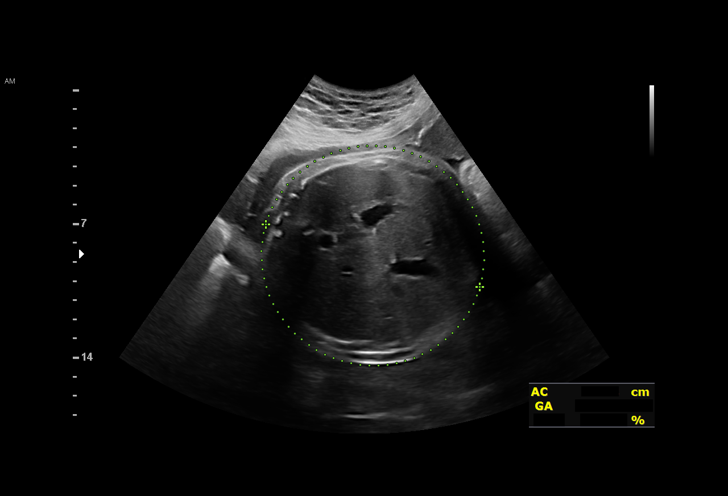
[im 22/54]
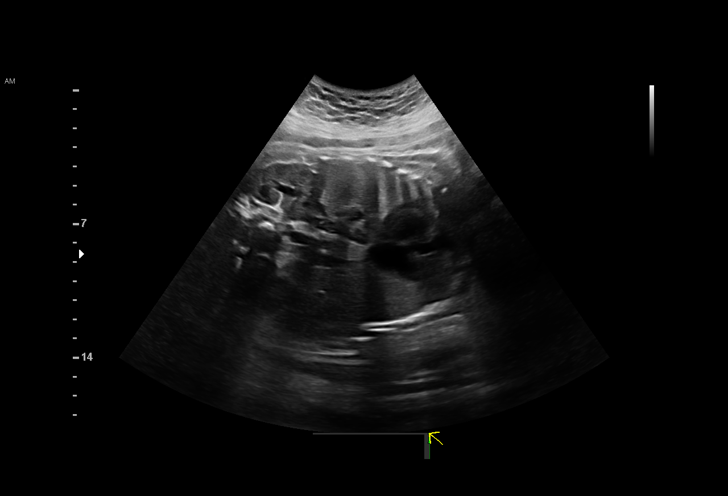
[im 26/54]
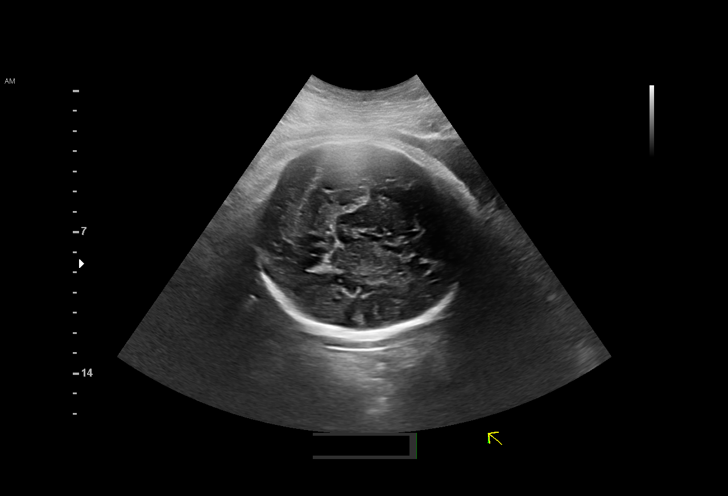
[im 30/54]
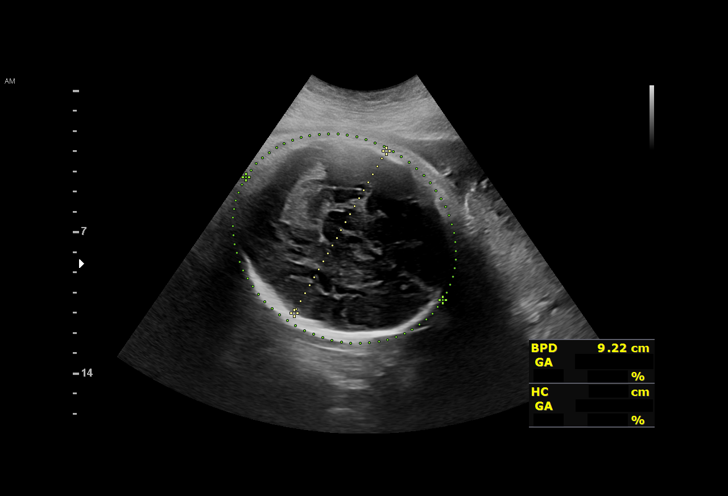
[im 34/54]
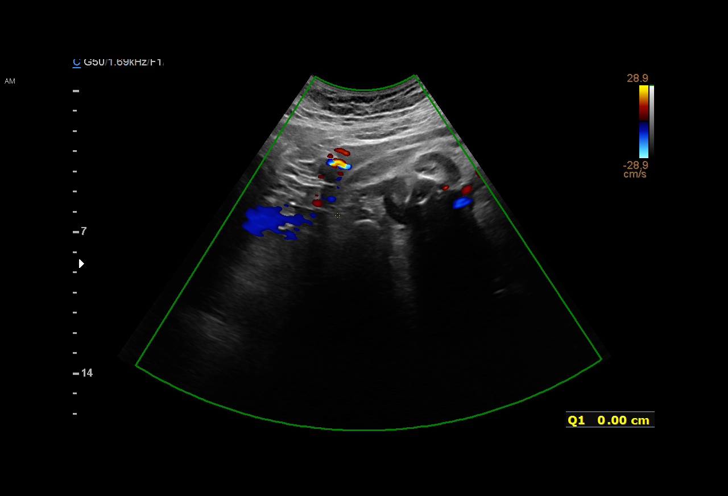
[im 38/54]
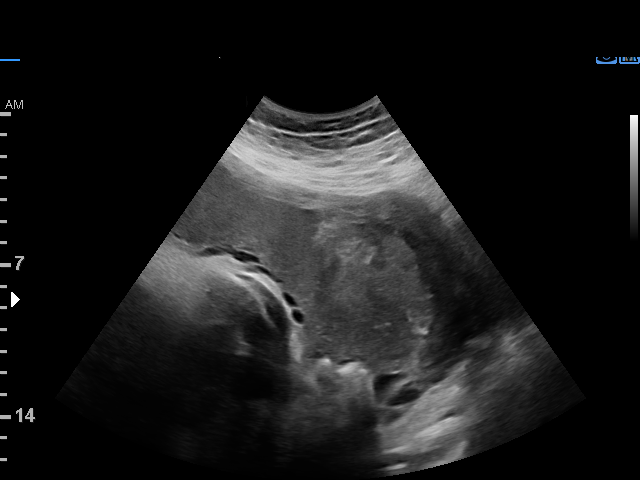
[im 42/54]
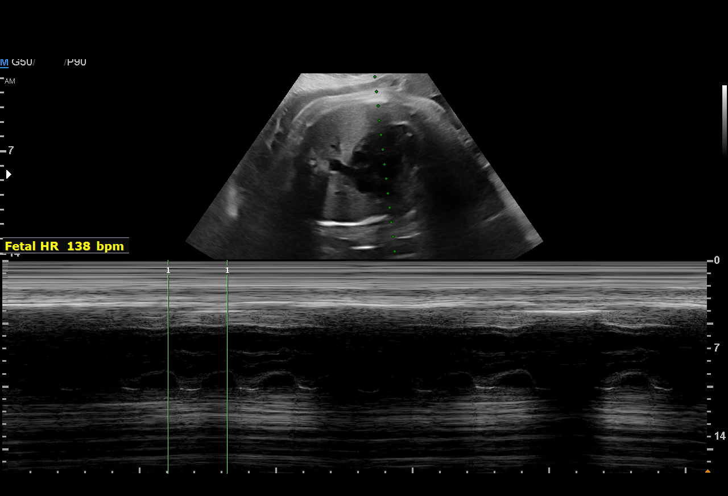
[im 46/54]
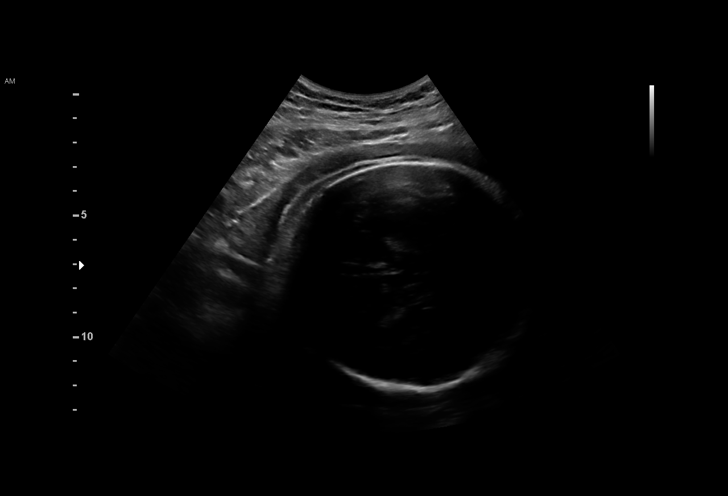
[im 50/54]
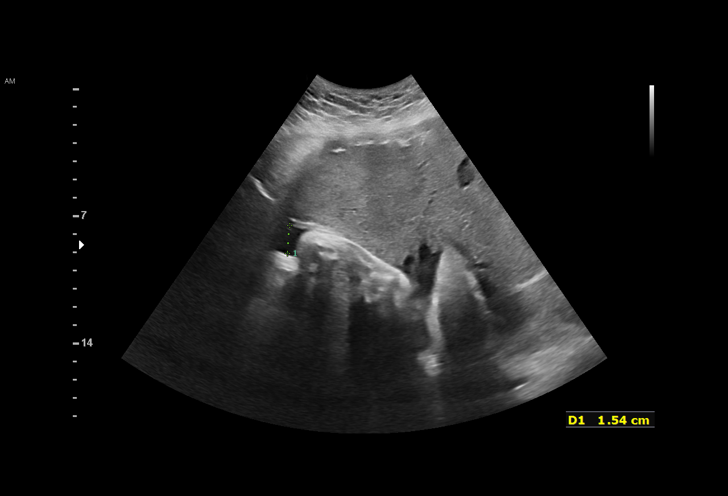
[im 54/54]
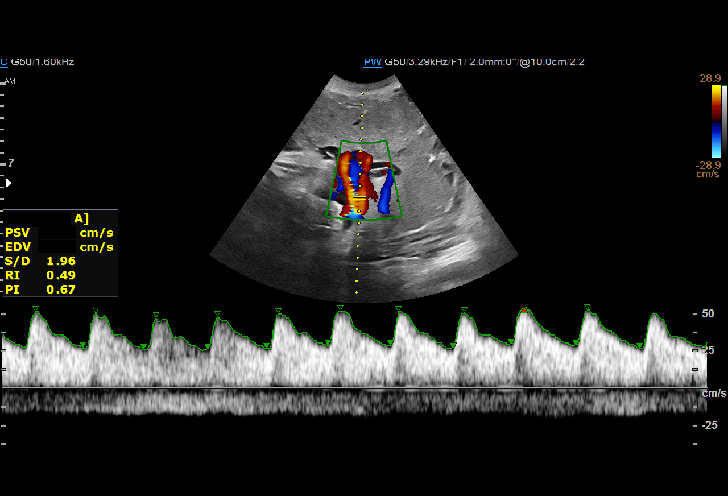

[14 of 28 positions shown; findings below may reference images not displayed]

Indications

 Obesity complicating pregnancy, third
 trimester (BMI 44)
 Hypertension - Chronic/Pre-existing
 38 weeks gestation of pregnancy
 LR NIPS, Neg Horizon
Fetal Evaluation

 Num Of Fetuses:         1
 Fetal Heart Rate(bpm):  138
 Cardiac Activity:       Observed
 Presentation:           Cephalic
 Placenta:               Anterior
 P. Cord Insertion:      Previously Visualized

 Amniotic Fluid
 AFI FV:      Oligohydramnios

 AFI Sum(cm)     %Tile       Largest Pocket(cm)
 5.2             < 3

 RUQ(cm)       RLQ(cm)       LUQ(cm)        LLQ(cm)
 0             2.4           0
Biophysical Evaluation
 Amniotic F.V:   Within normal limits       F. Tone:        Observed
 F. Movement:    Observed                   Score:          [DATE]
 F. Breathing:   Observed
Biometry

 BPD:      92.4  mm     G. Age:  37w 4d         56  %    CI:        75.47   %    70 - 86
                                                         FL/HC:      23.6   %    20.9 -
 HC:      337.3  mm     G. Age:  38w 5d         41  %    HC/AC:      0.93        0.92 -
 AC:      363.1  mm     G. Age:  40w 2d         97  %    FL/BPD:     86.1   %    71 - 87
 FL:       79.6  mm     G. Age:  40w 5d         95  %    FL/AC:      21.9   %    20 - 24

 Est. FW:    0440  gm      8 lb 9 oz     92  %
OB History

 Gravidity:    2
 Living:       1
Gestational Age

 LMP:           38w 1d        Date:  08/25/20                 EDD:   06/01/21
 U/S Today:     39w 2d                                        EDD:   05/24/21
 Best:          38w 2d     Det. By:  U/S C R L  (10/27/20)    EDD:   05/31/21
Anatomy

 Cranium:               Appears normal         Stomach:                Appears normal, left
                                                                       sided
 Cavum:                 Appears normal         Kidneys:                Appear normal
 Heart:                 Appears normal         Bladder:                Appears normal
                        (4CH, axis, and
                        situs)
 Diaphragm:             Appears normal

 Other:  Technically difficult due to advanced gestational age.
Cervix Uterus Adnexa

 Cervix
 Not visualized (advanced GA >85wks)

 Uterus
 No abnormality visualized.

 Right Ovary
 Within normal limits.

 Left Ovary
 Within normal limits.
Comments

 Bambucafe Tarla was seen for a follow up growth scan and
 BPP scan due to maternal obesity and chronic hypertension
 that is not treated with any medications.  She denies any
 problems since her last exam and denies any leakage of fluid.
 She was informed that the fetal growth measures large for
 her gestational age (EFW 8 pounds 9 ounces, 92nd
 percentile).  Oligohydramnios with a total AFI of 5.2 cm is
 noted.
 A BPP performed today was [DATE].
 Due to oligohydramnios, delivery is recommended at her
 current gestational age.  The patient reports that she has a
 doctor's appointment for her son tomorrow morning and can
 come in tomorrow night for induction.  I called labor and
 delivery and have arranged an induction for her tomorrow
 night.
 The patient is agreeable for induction tomorrow.  She stated
 that all of her questions have been answered.
 A total of 20 minutes was spent counseling and coordinating
 the care for this patient.  Greater than 50% of the time was
 spent in direct face-to-face contact.

## 2023-10-08 ENCOUNTER — Ambulatory Visit

## 2023-10-17 ENCOUNTER — Ambulatory Visit

## 2023-10-17 DIAGNOSIS — Z3042 Encounter for surveillance of injectable contraceptive: Secondary | ICD-10-CM | POA: Diagnosis not present

## 2023-10-17 MED ORDER — MEDROXYPROGESTERONE ACETATE 150 MG/ML IM SUSP
150.0000 mg | Freq: Once | INTRAMUSCULAR | Status: AC
  Administered 2023-10-17: 150 mg via INTRAMUSCULAR

## 2023-10-17 NOTE — Progress Notes (Signed)
 Date last pap: 07/20/23. Last Depo-Provera : 07/20/23. Side Effects if any: NA. Serum HCG indicated? NA. Depo-Provera  150 mg IM given by: Duwaine Galla, RN. Next appointment due Sept 10-24.

## 2023-10-22 ENCOUNTER — Ambulatory Visit

## 2023-11-19 ENCOUNTER — Other Ambulatory Visit (HOSPITAL_BASED_OUTPATIENT_CLINIC_OR_DEPARTMENT_OTHER): Payer: Self-pay

## 2024-01-04 ENCOUNTER — Other Ambulatory Visit (HOSPITAL_BASED_OUTPATIENT_CLINIC_OR_DEPARTMENT_OTHER): Payer: Self-pay

## 2024-01-04 ENCOUNTER — Ambulatory Visit

## 2024-01-07 ENCOUNTER — Ambulatory Visit

## 2024-01-07 VITALS — BP 136/81 | HR 69 | Wt 312.6 lb

## 2024-01-07 DIAGNOSIS — Z3042 Encounter for surveillance of injectable contraceptive: Secondary | ICD-10-CM

## 2024-01-07 DIAGNOSIS — Z3202 Encounter for pregnancy test, result negative: Secondary | ICD-10-CM

## 2024-01-07 NOTE — Progress Notes (Signed)
 Pt is in the office for depo injection. Administered in R Del and pt tolerated well.  Next due Dec 1-15. .. Administrations This Visit     medroxyPROGESTERone  (DEPO-PROVERA ) injection 150 mg     Admin Date 01/07/2024 Action Given Dose 150 mg Route Intramuscular Documented By Doneta Laymon BIRCH, RN

## 2024-01-28 ENCOUNTER — Ambulatory Visit

## 2024-02-01 ENCOUNTER — Ambulatory Visit
Admission: RE | Admit: 2024-02-01 | Discharge: 2024-02-01 | Disposition: A | Discharge status: Home / Self Care | Source: Ambulatory Visit

## 2024-02-01 ENCOUNTER — Other Ambulatory Visit: Payer: Self-pay

## 2024-02-01 VITALS — BP 111/83 | HR 86 | Temp 98.8°F | Resp 20

## 2024-02-01 DIAGNOSIS — H66001 Acute suppurative otitis media without spontaneous rupture of ear drum, right ear: Secondary | ICD-10-CM | POA: Diagnosis not present

## 2024-02-01 MED ORDER — AMOXICILLIN 875 MG PO TABS: 875.0000 mg | ORAL_TABLET | Freq: Two times a day (BID) | ORAL | 0 refills | Status: AC

## 2024-02-01 NOTE — ED Provider Notes (Signed)
 [facility]    CSN: [id number] Arrival date & time: 02/01/24  1227      History   Chief Complaint Chief Complaint  Patient presents with   Ear Injury    Woke up experiencing consistent ringing in the right ear. - Entered by patient    HPI Joan Dawson is a 33 y.o. female.   Patient presents today with 2 weeks of congestion and runny nose.  Reports history of allergies.  Not currently taking anything for allergies.  Woke up this morning with worsening pain and ringing in the right ear.  No ear drainage, fever, sore throat.  Has not taken any medications for symptoms today.    Past Medical History:  Diagnosis Date   ADHD    Hypertension    Migraines    Scoliosis     Patient Active Problem List   Diagnosis Date Noted   Right shoulder pain 12/23/2022   Anxiety 12/23/2022   Encounter for supervision of high risk pregnancy in third trimester, antepartum 05/21/2021   Oligohydramnios 05/21/2021   Postpartum hemorrhage 05/21/2021   LGA (large for gestational age) fetus affecting management of mother 03/02/2021   Chronic hypertension with superimposed preeclampsia 12/01/2020   Obesity in pregnancy 11/03/2020   Supervision of other normal pregnancy, antepartum 10/27/2020   Right knee pain 12/02/2019   History of migraine 02/18/2018    Past Surgical History:  Procedure Laterality Date   NO PAST SURGERIES      OB History     Gravida  2   Para  2   Term  2   Preterm      AB      Living  2      SAB      IAB      Ectopic      Multiple  0   Live Births  2            Home Medications    Prior to Admission medications   Medication Sig Start Date End Date Taking? Authorizing Provider  amoxicillin  (AMOXIL ) 875 MG tablet Take 1 tablet (875 mg total) by mouth 2 (two) times daily for 5 days. 02/01/24 02/06/24 Yes Chandra Harlene LABOR, NP  baclofen (LIORESAL) 10 MG tablet Take 10 mg by mouth. 01/27/24 02/06/24 Yes [provider]   naproxen (NAPROSYN) 500 MG tablet Take 500 mg by mouth. 01/27/24 02/06/24 Yes [provider]  cetirizine  (ZYRTEC  ALLERGY) 10 MG tablet Take 1 tablet (10 mg total) by mouth daily. 08/05/21   Rudy Carlin LABOR, MD  cyclobenzaprine  (FLEXERIL ) 5 MG tablet Take 1 tablet (5 mg total) by mouth 3 (three) times daily as needed for muscle spasms. 12/22/22   Del Wilhelmena Lloyd Sola, FNP  escitalopram  (LEXAPRO ) 10 MG tablet Take 1 tablet (10 mg total) by mouth daily. 12/22/22   Del Wilhelmena Lloyd Sola, FNP  medroxyPROGESTERone  (DEPO-PROVERA ) 150 MG/ML injection Inject 1 mL (150 mg total) into the muscle every 3 (three) months. 07/20/23   Erik Kieth BROCKS, MD  medroxyPROGESTERone  Acetate 150 MG/ML SUSY ADMINISTER 1 ML INTO THE MUSC;LE EVERY 3 MONTHS. 08/21/22   Rudy Carlin LABOR, MD  Multiple Vitamin (MULTIVITAMIN) tablet Take 1 tablet by mouth daily.    [provider]    Family History Family History  Problem Relation Age of Onset   Diabetes Mother    Uterine cancer Mother    Hypertension Mother    Crohn's disease Sister    Hypertension Sister  Hypertension Brother     Social History Social History   Tobacco Use   Smoking status: Former    Types: E-cigarettes    Passive exposure: Past   Smokeless tobacco: Never   Tobacco comments:    vapes  Vaping Use   Vaping status: Former  Substance Use Topics   Alcohol use: Not Currently    Comment: socially   Drug use: No     Allergies   Patient has no known allergies.   Review of Systems Review of Systems Per HPI  Physical Exam Triage Vital Signs ED Triage Vitals  Encounter Vitals Group     BP 02/01/24 1321 111/83     Girls Systolic BP Percentile --      Girls Diastolic BP Percentile --      Boys Systolic BP Percentile --      Boys Diastolic BP Percentile --      Pulse Rate 02/01/24 1321 86     Resp 02/01/24 1321 20     Temp 02/01/24 1321 98.8 F (37.1 C)     Temp Source 02/01/24 1321 Oral     SpO2  02/01/24 1321 98 %     Weight --      Height --      Head Circumference --      Peak Flow --      Pain Score 02/01/24 1319 0     Pain Loc --      Pain Education --      Exclude from Growth Chart --    No data found.  Updated Vital Signs BP 111/83 (BP Location: Right Arm)   Pulse 86   Temp 98.8 F (37.1 C) (Oral)   Resp 20   SpO2 98%   Visual Acuity Right Eye Distance:   Left Eye Distance:   Bilateral Distance:    Right Eye Near:   Left Eye Near:    Bilateral Near:     Physical Exam Vitals and nursing note reviewed.  Constitutional:      General: She is not in acute distress.    Appearance: Normal appearance. She is not toxic-appearing.  HENT:     Right Ear: A middle ear effusion is present. Tympanic membrane is erythematous.     Left Ear: A middle ear effusion is present.     Mouth/Throat:     Pharynx: Postnasal drip present.  Pulmonary:     Effort: Pulmonary effort is normal. No respiratory distress.  Musculoskeletal:     Cervical back: Normal range of motion.  Lymphadenopathy:     Cervical: No cervical adenopathy.  Skin:    General: Skin is warm and dry.     Coloration: Skin is not jaundiced or pale.     Findings: No erythema.  Neurological:     Mental Status: She is alert and oriented to person, place, and time.  Psychiatric:        Behavior: Behavior is cooperative.      UC Treatments / Results  Labs (all labs ordered are listed, but only abnormal results are displayed) Labs Reviewed - No data to display  EKG   Radiology No results found.  Procedures Procedures (including critical care time)  Medications Ordered in UC Medications - No data to display  Initial Impression / Assessment and Plan / UC Course  I have reviewed the triage vital signs and the nursing notes.  Pertinent labs & imaging results that were available during my care of the patient were  reviewed by me and considered in my medical decision making (see chart for  details).   Patient is well-appearing, normotensive, afebrile, not tachycardic, not tachypneic, oxygenating well on room air.   1. Non-recurrent acute suppurative otitis media of right ear without spontaneous rupture of tympanic membrane Treat with amoxicillin  twice daily for 5 days Recommended use of Flonase  nasal spray for allergies Work excuse provided   The patient was given the opportunity to ask questions.  All questions answered to their satisfaction.  The patient is in agreement to this plan.   Final Clinical Impressions(s) / UC Diagnoses   Final diagnoses:  Non-recurrent acute suppurative otitis media of right ear without spontaneous rupture of tympanic membrane     Discharge Instructions      You have an ear infection in your right ear.  Take the amoxicillin  as prescribed to treat it.  Also recommend Flonase  nasal spray to help with allergies.  Seek care if symptoms do not improve with treatment.    ED Prescriptions     Medication Sig Dispense Auth. Provider   amoxicillin  (AMOXIL ) 875 MG tablet Take 1 tablet (875 mg total) by mouth 2 (two) times daily for 5 days. 10 tablet Chandra Harlene LABOR, NP      PDMP not reviewed this encounter.   Chandra Harlene LABOR, NP 02/01/24 [phone]

## 2024-02-01 NOTE — ED Triage Notes (Signed)
 Pt reports right ear ringing since waking up this am. Denies any known injury or otc medication prior to UC arrival.

## 2024-02-01 NOTE — Discharge Instructions (Signed)
 You have an ear infection in your right ear.  Take the amoxicillin  as prescribed to treat it.  Also recommend Flonase  nasal spray to help with allergies.  Seek care if symptoms do not improve with treatment.

## 2024-03-25 ENCOUNTER — Ambulatory Visit

## 2024-03-25 VITALS — BP 109/70 | HR 63 | Wt 311.0 lb

## 2024-03-25 DIAGNOSIS — Z3042 Encounter for surveillance of injectable contraceptive: Secondary | ICD-10-CM

## 2024-03-25 NOTE — Progress Notes (Signed)
..  Date last pap: 07/20/23. Last Depo-Provera : 01/07/24. Side Effects if any: N/A. Serum HCG indicated? N/a. Depo-Provera  150 mg IM in L Del Next appointment due Feb 17- March 3. .. Administrations This Visit     medroxyPROGESTERone  (DEPO-PROVERA ) injection 150 mg     Admin Date 03/25/2024 Action Given Dose 150 mg Route Intramuscular Documented By Doneta Laymon BIRCH, RN

## 2024-06-17 ENCOUNTER — Ambulatory Visit
